# Patient Record
Sex: Female | Born: 1985
Health system: Southern US, Community
[De-identification: ages and names within clinical notes are randomized; demographics above are authoritative.]

## PROBLEM LIST (undated history)

## (undated) DIAGNOSIS — T783XXA Angioneurotic edema, initial encounter: Secondary | ICD-10-CM

## (undated) DIAGNOSIS — F909 Attention-deficit hyperactivity disorder, unspecified type: Secondary | ICD-10-CM

## (undated) DIAGNOSIS — I493 Ventricular premature depolarization: Secondary | ICD-10-CM

## (undated) DIAGNOSIS — N390 Urinary tract infection, site not specified: Secondary | ICD-10-CM

## (undated) DIAGNOSIS — J45909 Unspecified asthma, uncomplicated: Secondary | ICD-10-CM

## (undated) HISTORY — DX: Attention-deficit hyperactivity disorder, unspecified type: F90.9

## (undated) HISTORY — DX: Angioneurotic edema, initial encounter: T78.3XXA

## (undated) HISTORY — DX: Urinary tract infection, site not specified: N39.0

## (undated) HISTORY — DX: Unspecified asthma, uncomplicated: J45.909

## (undated) HISTORY — DX: Ventricular premature depolarization: I49.3

## (undated) HISTORY — PX: ESOPHAGOGASTRODUODENOSCOPY: SHX1529

---

## 2006-12-10 HISTORY — PX: WISDOM TOOTH EXTRACTION: SHX21

## 2008-12-10 HISTORY — PX: LEEP: SHX91

## 2017-04-15 ENCOUNTER — Emergency Department (HOSPITAL_COMMUNITY)
Admission: EM | Admit: 2017-04-15 | Discharge: 2017-04-15 | Disposition: A | Discharge status: Home / Self Care | Payer: Self-pay | Attending: Dermatology | Admitting: Dermatology

## 2017-04-15 DIAGNOSIS — R070 Pain in throat: Secondary | ICD-10-CM | POA: Insufficient documentation

## 2017-04-15 DIAGNOSIS — Z5321 Procedure and treatment not carried out due to patient leaving prior to being seen by health care provider: Secondary | ICD-10-CM | POA: Insufficient documentation

## 2017-04-15 DIAGNOSIS — T781XXA Other adverse food reactions, not elsewhere classified, initial encounter: Secondary | ICD-10-CM | POA: Insufficient documentation

## 2017-04-15 NOTE — ED Notes (Signed)
PT CALLED X1 WITH NO ANSWER. 

## 2017-04-15 NOTE — ED Triage Notes (Signed)
Pt called from triage with no answer 

## 2017-04-15 NOTE — ED Triage Notes (Signed)
Pt states she is allergic to walnuts and drank a drink with almond in it, she said it felt like her throat was getting thick, no rash noted Pt took liquid benedryl and feels a little better

## 2017-07-02 ENCOUNTER — Ambulatory Visit: Payer: Self-pay | Admitting: Allergy and Immunology

## 2017-07-04 ENCOUNTER — Ambulatory Visit (INDEPENDENT_AMBULATORY_CARE_PROVIDER_SITE_OTHER): Payer: 59 | Admitting: Allergy and Immunology

## 2017-07-04 ENCOUNTER — Encounter: Payer: Self-pay | Admitting: Allergy and Immunology

## 2017-07-04 ENCOUNTER — Ambulatory Visit: Payer: Self-pay | Admitting: Allergy and Immunology

## 2017-07-04 VITALS — BP 116/78 | HR 80 | Temp 98.6°F | Resp 16 | Ht 62.68 in | Wt 144.8 lb

## 2017-07-04 DIAGNOSIS — J4520 Mild intermittent asthma, uncomplicated: Secondary | ICD-10-CM

## 2017-07-04 DIAGNOSIS — T781XXA Other adverse food reactions, not elsewhere classified, initial encounter: Secondary | ICD-10-CM | POA: Diagnosis not present

## 2017-07-04 DIAGNOSIS — J3089 Other allergic rhinitis: Secondary | ICD-10-CM | POA: Diagnosis not present

## 2017-07-04 DIAGNOSIS — Z91018 Allergy to other foods: Secondary | ICD-10-CM | POA: Diagnosis not present

## 2017-07-04 MED ORDER — FAMOTIDINE 20 MG PO TABS: 20.0000 mg | ORAL_TABLET | Freq: Every day | ORAL | 5 refills | Status: DC

## 2017-07-04 MED ORDER — LORATADINE 10 MG PO TABS
10.0000 mg | ORAL_TABLET | Freq: Every day | ORAL | 5 refills | Status: DC
Start: 2017-07-04 — End: 2019-01-14

## 2017-07-04 MED ORDER — AUVI-Q 0.3 MG/0.3ML IJ SOAJ: INTRAMUSCULAR | 3 refills | Status: DC

## 2017-07-04 NOTE — Progress Notes (Signed)
Dear Dr. Susa SimmondsVia,  Thank you for referring [name] [name] to the [facility] [place] on 07/04/2017.   Below is a summation of this patient's evaluation and recommendations.  Thank you for your referral. I will keep you informed about this patient's response to treatment.   If you have any questions please do not hesitate to contact me.   Sincerely,  Jessica PriestEric J. Kozlow, MD Allergy / Immunology [facility]   ______________________________________________________________________    NEW PATIENT NOTE  Referring Provider: Iva BoopVia, Kevin, MD Primary Provider: Iva BoopVia, Kevin, MD Date of office visit: 07/04/2017    Subjective:   Chief Complaint:  [name] [name] (DOB: August 23, 1986) is a 31 y.o. female who [name] to the clinic on 07/04/2017 with a chief complaint of Allergic Reaction .     HPI: [name] [name] to this clinic in evaluation of possible allergic reaction. In March 2017 after eating banana bread with walnut she developed an issue with throat closing and throat itching. New years of 2018 she had soy-based eggnog and within 1 minute she felt her throat closing up and she could not swallow and she took Benadryl. Subsequently she has had coffee creamer with almond and has developed a similar type presentation with an itchy tongue and itchy throat and finding it hard to swallow. This has also occurred when eating various other items like Snickers and Teddy grams and eating french fries. She has also had a problem with eating pears and other fruits regarding mouth and throat itching. She has never had any associated systemic or constitutional symptoms with these events.  She also has a history of exercise-induced asthma that has presented itself over the course of the past year or so. Apparently she was given Advair at one point that she only used intermittently. She has also been given a short acting bronchodilator that  she uses prior to exercise with good results. She has not required a systemic steroid to treat an exacerbation of asthma. She does have a history of allergic rhinitis for which she will occasionally use antihistamines or nasal steroids.  Apparently whenever she develops one of her allergic reactions in addition to using a antihistamine she will take a prednisone dose.  She does not have any reflux symptoms to suggest that she has significant LPR. She has no difficulty with swallowing or eating and does not develop any choking or gagging or coughing when either drinking or eating.  Past Medical History:  Diagnosis Date  . Asthma     Past Surgical History:  Procedure Laterality Date  . WISDOM TOOTH EXTRACTION  2008    Allergies as of 07/04/2017      Reactions   Ketoconazole Rash      Medication List      BENADRYL ALLERGY PO Take by mouth as needed.   cetirizine 10 MG tablet Commonly known as:  ZYRTEC Take 10 mg by mouth as needed for allergies.   EPIPEN 2-PAK 0.3 mg/0.3 mL Soaj injection Generic drug:  EPINEPHrine   FLONASE 50 MCG/ACT nasal spray Generic drug:  fluticasone Place 1 spray into both nostrils daily as needed for allergies or rhinitis.   predniSONE 10 MG tablet Commonly known as:  DELTASONE Take 10 mg by mouth.   PROAIR HFA 108 (90 Base) MCG/ACT inhaler Generic drug:  albuterol Inhale 2 puffs into the lungs every 6 (six) hours as needed for wheezing or shortness of breath.  Review of systems negative except as noted in HPI / PMHx or noted below:  Review of Systems  Constitutional: Negative.   HENT: Negative.   Eyes: Negative.   Respiratory: Negative.   Cardiovascular: Negative.   Gastrointestinal: Negative.   Genitourinary: Negative.   Musculoskeletal: Negative.   Skin: Negative.   Neurological: Negative.   Endo/Heme/Allergies: Negative.   Psychiatric/Behavioral: Negative.     Family History  Problem Relation Age of Onset  . Diabetes  Mother   . Diabetes Father   . Heart disease Father   . Eczema Sister     Social History   Social History  . Marital status: Married    Spouse name: N/A  . Number of children: N/A  . Years of education: N/A   Occupational History  . Not on file.   Social History Main Topics  . Smoking status: Never Smoker  . Smokeless tobacco: Never Used  . Alcohol use Yes     Comment: Very rare  . Drug use: No  . Sexual activity: Not on file   Other Topics Concern  . Not on file   Social History Narrative  . No narrative on file    Environmental and Social history  Lives in a house with a dry environment, no animals located inside the household, plastic on the bed, plastic on the pillow, no smokers located inside the household and employment as a Publishing rights manager.  Objective:   Vitals:   07/04/17 0924  BP: 116/78  Pulse: 80  Resp: 16  Temp: 98.6 F (37 C)   Height: 5' 2.68" (159.2 cm) Weight: 144 lb 12.8 oz (65.7 kg)  Physical Exam  Constitutional: She is well-developed, well-nourished, and in no distress.  HENT:  Head: Normocephalic. Head is without right periorbital erythema and without left periorbital erythema.  Right Ear: Tympanic membrane, external ear and ear canal normal.  Left Ear: Tympanic membrane, external ear and ear canal normal.  Nose: Nose normal. No mucosal edema or rhinorrhea.  Mouth/Throat: Oropharynx is clear and moist and mucous membranes are normal. No oropharyngeal exudate.  Eyes: Pupils are equal, round, and reactive to light. Conjunctivae and lids are normal.  Neck: Trachea normal. No tracheal deviation present. No thyromegaly present.  Cardiovascular: Normal rate, regular rhythm, S1 normal, S2 normal and normal heart sounds.   No murmur heard. Pulmonary/Chest: Effort normal. No stridor. No tachypnea. No respiratory distress. She has no wheezes. She has no rales. She exhibits no tenderness.  Abdominal: Soft. She exhibits no distension and no  mass. There is no hepatosplenomegaly. There is no tenderness. There is no rebound and no guarding.  Musculoskeletal: She exhibits no edema or tenderness.  Lymphadenopathy:       Head (right side): No tonsillar adenopathy present.       Head (left side): No tonsillar adenopathy present.    She has no cervical adenopathy.    She has no axillary adenopathy.  Neurological: She is alert. Gait normal.  Skin: No rash noted. She is not diaphoretic. No erythema. No pallor. Nails show no clubbing.  Psychiatric: Mood and affect normal.    Diagnostics: Allergy skin tests were performed. He demonstrated hypersensitivity to grasses, weeds, trees,molds, and cat. She also demonstrated hypersensitivity to peanut, soybean, corn,green peas, cottonseed, hazelnut, [place] nut  Spirometry was performed and demonstrated an FEV1 of 2.57 @ 98 % of predicted.  Assessment and Plan:    1. Food allergy   2. Pollen-food allergy, initial encounter   3. Asthma,  mild intermittent, well-controlled   4. Other allergic rhinitis     1. Allergen avoidance measures  2. Auvi-Q 0.3, Benadryl, M.D./ER evaluation for allergic reaction  3. Can utilize a preventative plan including loratadine 10 mg and famotidine 20 mg daily  4. Can continue to use albuterol MDI 2 inhalations before exercise  5. Blood tests - nut panel, peanut components  6. Challenge?  7. Other sources of throat problem? Yes, if continued problems without obvious precipitants  Megen appears to have immunological hyperreactivity directed against both pollens and food products. I suspect that she has oral allergy syndrome for the most part and may have some systemic symptoms. She will need to be very careful about eating food products that cross react with pollens as there is a chance of her developing systemic symptoms with exposure. I have given her an injectable epinephrine device to have on hand if she ever develops a significant reaction. She can try a  combination of an H1 and H2 receptor blocker to see if this increases her threshold for exposure. I did have a talk with her today about the role of immunotherapy directed against aeroallergens that may modify her oral allergy syndrome. I'm going to check a nut panel and peanut components to see if she would qualify for an in clinic food challenge and also to get a better idea about whether or not her peanut sensitivity is a result of pollen cross-reactivity or directed against non-labile proteins. I will contact her with the results of her blood tests once they are available for review and we can make a plan on how to proceed pending her response to the approach noted above.  Jessica PriestEric J. Kozlow, MD Allergy / Immunology [facility]

## 2017-07-04 NOTE — Patient Instructions (Addendum)
  1. Allergen avoidance measures  2. Auvi-Q 0.3, Benadryl, M.D./ER evaluation for allergic reaction  3. Can utilize a preventative plan including loratadine 10 mg and famotidine 20 mg daily  4. Can continue to use albuterol MDI 2 inhalations before exercise  5. Blood tests - nut panel, peanut components  6. Challenge?  7. Other sources of throat problem? Yes, if continued problems without obvious precipitants

## 2017-07-04 NOTE — Progress Notes (Deleted)
Follow-up Note  Referring Provider: Iva Boop, MD Primary Provider: Iva Boop, MD Date of Office Visit: 07/04/2017   Subjective:   [name] [name] (DOB: Dec 12, 1985) is a 31 y.o. female who [facility]2017 in re-evaluation of the following:  HPI:   Allergies as of 07/04/2017      Reactions   Ketoconazole Rash      Medication List       Accurate as of 07/04/17  9:55 AM. Always use your most recent med list.          BENADRYL ALLERGY PO Take by mouth as needed.   cetirizine 10 MG tablet Commonly known as:  ZYRTEC Take 10 mg by mouth as needed for allergies.   EPIPEN 2-PAK 0.3 mg/0.3 mL Soaj injection Generic drug:  EPINEPHrine   FLONASE 50 MCG/ACT nasal spray Generic drug:  fluticasone Place 1 spray into both nostrils daily as needed for allergies or rhinitis.   predniSONE 10 MG tablet Commonly known as:  DELTASONE Take 10 mg by mouth.   PROAIR HFA 108 (90 Base) MCG/ACT inhaler Generic drug:  albuterol Inhale 2 puffs into the lungs every 6 (six) hours as needed for wheezing or shortness of breath.   ranitidine 150 MG tablet Commonly known as:  ZANTAC Take 150 mg by mouth as needed.       Past Medical History:  Diagnosis Date  . Asthma     Past Surgical History:  Procedure Laterality Date  . WISDOM TOOTH EXTRACTION  2008    Review of systems negative except as noted in HPI / PMHx or noted below:  ROS   Objective:   Vitals:   07/04/17 0924  BP: 116/78  Pulse: 80  Resp: 16  Temp: 98.6 F (37 C)   Height: 5' 2.68" (159.2 cm)  Weight: 144 lb 12.8 oz (65.7 kg)   Physical Exam  Diagnostics:    Spirometry was performed and demonstrated an FEV1 of *** at *** % of predicted.  The patient had an Asthma Control Test with the following results:  .    Assessment and Plan:   1. Food allergy   2. Asthma, mild intermittent, well-controlled     Patient Instructions   1. Allergen avoidance  measures  2. Auvi-Q 0.3, Benadryl, M.D./ER evaluation for allergic reaction  3. Can utilize a preventative plan including loratadine 10 mg and famotidine 20 mg daily  4. Can continue to use albuterol MDI 2 inhalations before exercise  5. Blood tests - nut panel, peanut components  6. Challenge?  7. Other sources of throat problem?   Laurette Schimke, MD Allergy / Immunology [facility]

## 2017-12-05 ENCOUNTER — Encounter: Payer: Self-pay | Admitting: Allergy

## 2017-12-05 ENCOUNTER — Ambulatory Visit (INDEPENDENT_AMBULATORY_CARE_PROVIDER_SITE_OTHER): Admitting: Allergy

## 2017-12-05 ENCOUNTER — Telehealth: Payer: Self-pay

## 2017-12-05 VITALS — BP 110/68 | HR 87 | Resp 17

## 2017-12-05 DIAGNOSIS — Z91018 Allergy to other foods: Secondary | ICD-10-CM | POA: Diagnosis not present

## 2017-12-05 DIAGNOSIS — J3089 Other allergic rhinitis: Secondary | ICD-10-CM

## 2017-12-05 DIAGNOSIS — T781XXD Other adverse food reactions, not elsewhere classified, subsequent encounter: Secondary | ICD-10-CM | POA: Diagnosis not present

## 2017-12-05 DIAGNOSIS — J4520 Mild intermittent asthma, uncomplicated: Secondary | ICD-10-CM | POA: Diagnosis not present

## 2017-12-05 MED ORDER — LEVOCETIRIZINE DIHYDROCHLORIDE 5 MG PO TABS: 5.0000 mg | ORAL_TABLET | Freq: Every evening | ORAL | 1 refills | Status: DC

## 2017-12-05 MED ORDER — FAMOTIDINE 20 MG PO TABS: 20.0000 mg | ORAL_TABLET | Freq: Every day | ORAL | 1 refills | Status: DC

## 2017-12-05 MED ORDER — MONTELUKAST SODIUM 10 MG PO TABS: 10.0000 mg | ORAL_TABLET | Freq: Every day | ORAL | 1 refills | Status: DC

## 2017-12-05 MED ORDER — EPINEPHRINE 0.3 MG/0.3ML IJ SOAJ: 0.3000 mg | Freq: Once | INTRAMUSCULAR | 1 refills | Status: AC

## 2017-12-05 NOTE — Patient Instructions (Addendum)
  1. Environmental Allergen avoidance measures for grasses, weeds, trees,molds, and cat.  Food allergy avoidance measures for peanut, soybean, corn, green peas, cottonseed, hazelnut, [place] nut.   Will obtain serum IgE levels to these foods above as well as for wheat.   Will also obtain tryptase level.    2. Epipen 0.3, Benadryl, M.D./ER evaluation for allergic reaction.  Follow emergency action plan in case of allergic reaction  3. Can utilize a preventative plan including Xyzal 5 mg +/- famotidine 20 mg daily  4. Start Singulair 10mg  daily which helps with exercise induced asthma,  environmental allergy as well as helps decrease risk of allergic reaction.  5. Can continue to use albuterol MDI 2 inhalations before exercise and as needed every 4-6 hours for cough/wheeze/shortness of breath/chest tightness symptoms  6. In-office Challenge may be options pending lab results  Follow-up 4 months or sooner if needed

## 2017-12-05 NOTE — Telephone Encounter (Signed)
Referral placed in [facility] for [facility]. Will follow up next week.

## 2017-12-05 NOTE — Progress Notes (Signed)
Follow-up Note  RE: [name] [name] MRN: [id number] DOB: 03-Jun-1986 Date of Office Visit: 12/05/2017   History of present illness: [name] [name] is a 31 y.o. female presenting today for follow-up of food allergy, asthma, pollen food allergy syndrome and allergic rhinitis.  She was last seen in the office on 07/04/17 by Dr. Lucie LeatherKozlow.  Since this visit she has not had any surgeries or hospitalizations.  She has had issues with reactions surrounding food ingestion since last visit.  She states she has now noticed reactions to foods she thought were previously safe.  She states over the holiday she [facility] and within minutes started to feel her throat tighten with trouble swallowing.  She started taking benadryl melt tabs which she states she starts with 12.5mg  and repeats dose if symptoms persist.  She states she has never needed to get up to 50mg .  She had another reaction after christmas dinner which she recalls having cornbread made with cream corn.  She states this reaction was milder and she didn't need any benadryl.  She had another reaction with same oral symptoms with apple slices.  Her skin testing at last visit did show hypersensitivity to peanut, soybean, corn, green peas, cottonseed, hazelnut, [place] nut.  She has been avoiding nuts and soy.  She does not have access to her own epinephrine device but states his son has an epinephrine device.   She states she is able to eat blueberries, blackberries, strawberries without issue.   She was advised after last visit to take loratadine and famotidine however she has not taken these as she was unsure as to why she needed to take these medications.   She would like to see a nutritionist as she is now scared about what she is able to eat.     She gets winded when she works out outside during physical activity.  Thus she reports she does not work out.  She states this only happens since she has been in [place] since 2011.  She  states does use albuterol beforehand which does help prevent symptoms.  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  She does feel her breathing is tight right now.  She states her mother is here visit for holidays who brought her cats with her and she has a live christmas tree which she feels is bothering her asthma symptoms.     She states she has not had any significant nasal or ocular symptoms at this time.     Review of systems: Review of Systems  Constitutional: Negative for chills, fever and malaise/fatigue.  HENT: Negative for congestion, ear discharge, ear pain, nosebleeds, sinus pain and sore throat.   Eyes: Negative for pain, discharge and redness.  Respiratory: Negative for cough, sputum production, shortness of breath and wheezing.   Cardiovascular: Negative for chest pain.  Gastrointestinal: Negative for abdominal pain, constipation, diarrhea, heartburn, nausea and vomiting.  Musculoskeletal: Negative for joint pain.  Skin: Negative for itching and rash.  Neurological: Negative for headaches.    All other systems negative unless noted above in HPI  Past medical/social/surgical/family history have been reviewed and are unchanged unless specifically indicated below.  No changes  Medication List: Allergies as of 12/05/2017      Reactions   Ketoconazole Rash      Medication List        Accurate as of 12/05/17 12:26 PM. Always use your most recent med  list.          BENADRYL ALLERGY PO Take by mouth as needed.   cetirizine 10 MG tablet Commonly known as:  ZYRTEC Take 10 mg by mouth as needed for allergies.   EPIPEN 2-PAK 0.3 mg/0.3 mL Soaj injection Generic drug:  EPINEPHrine   AUVI-Q 0.3 mg/0.3 mL Soaj injection Generic drug:  EPINEPHrine Use as directed for life-threatening allergic reaction.   famotidine 20 MG tablet Commonly known as:  PEPCID Take 1 tablet (20 mg total) by mouth daily.   FLONASE 50 MCG/ACT nasal spray Generic drug:   fluticasone Place 1 spray into both nostrils daily as needed for allergies or rhinitis.   loratadine 10 MG tablet Commonly known as:  CLARITIN Take 1 tablet (10 mg total) by mouth daily.   PROAIR HFA 108 (90 Base) MCG/ACT inhaler Generic drug:  albuterol Inhale 2 puffs into the lungs every 6 (six) hours as needed for wheezing or shortness of breath.       Known medication allergies: Allergies  Allergen Reactions  . Ketoconazole Rash     Physical examination: Blood pressure 110/68, pulse 87, resp. rate 17, SpO2 98 %.  General: Alert, interactive, in no acute distress. HEENT: PERRLA, TMs pearly gray, turbinates minimally edematous without discharge, post-pharynx non erythematous. Neck: Supple without lymphadenopathy. Lungs: Clear to auscultation without wheezing, rhonchi or rales. {no increased work of breathing.  Improved aeration following albuterol neb CV: Normal S1, S2 without murmurs. Abdomen: Nondistended, nontender. Skin: Warm and dry, without lesions or rashes. Extremities:  No clubbing, cyanosis or edema. Neuro:   Grossly intact.  Diagnositics/Labs:  Spirometry: FEV1: 1.09L  31%, FVC: 1.15L  29% post albuterol nebulization she had improvement in FEV1 to 1.43L which is a 31% improvement.   Assessment and plan:   Food allergy  -  She has had several reactions involving oral symptoms only after eating cornbread, animal crackers and apples since last visit.  The cornbread was made with cream corn which she has shown sensitivity too.  Animal crackers contain per ingredient list yellow corn flour, soybean oil, cottonseed oil of which the corn flour is most likely culprit.  Pt provided with list of foods she showed sensitivity too and will avoid these products for now.   Mild intermittent asthma - mostly related to activity and will start singulair Allergic rhinitis Pollen food allergy syndrome  1. Environmental Allergen avoidance measures for grasses, weeds, trees,molds,  and cat.  Food allergy avoidance measures for peanut, soybean, corn, green peas, cottonseed, hazelnut, [place] nut.   Will obtain serum IgE levels to these foods above as well as for wheat.   Will also obtain tryptase level.    2. Epipen 0.3, Benadryl, M.D./ER evaluation for allergic reaction.  Follow emergency action plan in case of allergic reaction  3. Can utilize a preventative plan including Xyzal 5 mg +/- famotidine 20 mg daily  4. Start Singulair 10mg  daily which helps with asthma,  environmental allergy as well as helps decrease risk of allergic reaction.  5. Can continue to use albuterol MDI 2 inhalations before exercise and as needed every 4-6 hours for cough/wheeze/shortness of breath/chest tightness symptoms  6. In-office Challenge may be options pending lab results  7.  Will also refer to nutritionist  Follow-up 4 months or sooner if needed  I appreciate the opportunity to take part in Sherilynn's care. Please do not hesitate to contact me with questions.  Sincerely,   Margo AyeShaylar Padgett, MD Allergy/Immunology [facility]

## 2017-12-05 NOTE — Telephone Encounter (Signed)
-----   Message from [facility] Larose HiresPatricia Padgett, MD sent at 12/05/2017  1:11 PM EST ----- Regarding: nutrition referral Please send referral for nutritionist with Dr. Estanislado PandyQuan's office.  Pt has mutltiple food allergy and is anxious about what she is able to eat

## 2017-12-08 LAB — ALLERGENS(7)
F020-IGE ALMOND: 0.8 kU/L — AB
F202-IgE Cashew Nut: <0.1 kU/L
Hazelnut (Filbert) IgE: 10.2 kU/L — AB
PEANUT IGE: 0.29 kU/L — AB
Pecan Nut IgE: <0.1 kU/L
Walnut IgE: <0.1 kU/L

## 2017-12-08 LAB — K083-IGE COTTONSEED

## 2017-12-08 LAB — ALLERGEN, CORN F8: Allergen Corn, IgE: 0.11 kU/L — AB

## 2017-12-08 LAB — TRYPTASE: Tryptase: 2.1 ug/L — ABNORMAL LOW (ref 2.2–13.2)

## 2017-12-08 LAB — ALLERGEN, WHEAT, F4

## 2017-12-08 LAB — ALLERGEN SOYBEAN: SOYBEAN IGE: 0.25 kU/L — AB

## 2017-12-08 LAB — ALLERGEN PEA F12: Allergen Green Pea IgE: <0.1 kU/L

## 2017-12-12 ENCOUNTER — Telehealth: Payer: Self-pay

## 2017-12-12 NOTE — Telephone Encounter (Signed)
Unfortunately there is not another replacement for singulair for exercise induced asthma symptoms.   Thus would just recommend she use her albuterol 2 puffs 15-20 minutes prior to activity.

## 2017-12-12 NOTE — Telephone Encounter (Signed)
Patient called to get her labs due to her going to see the nutritionist on tomorrow.   Please Advise

## 2017-12-12 NOTE — Telephone Encounter (Signed)
I called patient and gave her lab results. She wanted me to inform you that the montelukast did help her breathe but causes her dizziness and wanted to know if there is there another medication that she could try. Patient stated that she wanted to continue to exercise and breathe during it. Please advise.

## 2017-12-12 NOTE — Telephone Encounter (Signed)
Called and informed patient of Dr. Randell PatientPadgett's recommendation and she said she will do the albuterol before to see if it helps.

## 2017-12-12 NOTE — Telephone Encounter (Signed)
Result labs out for pt and is in the [place] pool.   The nutritionist being apart of [facility] should be able to see them thru Epic.

## 2017-12-13 ENCOUNTER — Encounter: Payer: Self-pay | Admitting: Registered"

## 2017-12-13 ENCOUNTER — Encounter: Attending: Allergy | Admitting: Registered"

## 2017-12-13 DIAGNOSIS — Z91018 Allergy to other foods: Secondary | ICD-10-CM | POA: Insufficient documentation

## 2017-12-13 DIAGNOSIS — Z713 Dietary counseling and surveillance: Secondary | ICD-10-CM | POA: Insufficient documentation

## 2017-12-13 NOTE — Patient Instructions (Addendum)
Make sure to get in three meals per day. Try to have balanced meals like the My Plate example (see handout). Try to include more vegetables, fruits, and whole grains at meals.    For help with meal planning:   https://www.kidswithfoodallergies.org/page/welcome.aspx  Https://whatscooking.AdDates.czfns.[place]da.gov/  Also recommend the foodallergy.org website for more information.

## 2017-12-13 NOTE — Progress Notes (Signed)
Medical Nutrition Therapy:  Appt start time: 1025 end time:  1150.   Assessment:  Primary concerns today: Pt referred due to multiple food allergies. Pt reports in 2017 she discovered she was lactose intolerant after having GI problems following eating pizza with cheese. Around July 2017 pt reports she made banana bread with walnuts and her throat started to close and she took benadryl. Pt reports she has never went into anaphalactic shock, but always takes benadryl when she feels any reaction coming on. Reactions usually include throat and/or mouth swelling. Pt also has environmental allergies. Pt has an Epi Pen but has not had to use it. Pt underwent allergy testing last year which showed positive results for peanuts, soybeans, corn, green peas, cottonseed, hazelnut, and [place] nuts. Pt reports that further blood tests did not show a positive result for green peas and cottonseed but all other foods remained positive. Pt reports she started having problems with taking a Women's Alive vitamin which contains fruits. She said the reaction did not require she take Benadryl but made her mouth feel like she was experiencing somewhat of an allergic reaction. Pt reports she has had mild oral reactions to some fruits (pears, mango, kiwi, cherry, and some types of apples. Reactions did not require Benadryl, pt reports she drinks water and that helps. Pt reports she had an allergic reaction with oral symptoms after eating [employer] animal crackers and cornbread which both contain corn as the crackers contain corn flour. Pt reports the most severe reaction she has experienced was after drinking Silk soy milk one time. She is unsure if it was due to the soy or cross contamination with nuts. Before having the reaction with the crackers and cornbread last month, pt reports she would regularly eat corn products, including corn chips and corn on the cob. Pt is a Publishing rights manager. Pt reports that she plans to ask her allergist  about doing allergy shots and possibly an in office food challenge for corn as pt reports she does not want to have to avoid corn long-term if she can help it as there are many foods that contain corn derived ingredients.   Food allergies/intolerances: Lactose intolerance -March 2017. Pt reports that she cannot tolerate cheese, milk, or yogurt. Drinks Fairlife milk. Pt tested positive via skin prick and blood test per pt for peanut, soy, corn, hazelnut, and [place] nut allergies. Skin prick tested positive for green pea and cottenseed allergy but negative blood test.   Pt currently tries to avoid lactose, nuts, corn, and soy.   Preferred Learning Style:   No preference indicated   Learning Readiness:   Ready   MEDICATIONS: See list.    DIETARY INTAKE:  Usual eating pattern includes 3 meals per day.  Everyday foods include toast, eggs, mini bagel, Fairlife milk .  Avoided foods include kiwi, cherry, some apples, pears, mango-pt reports having mild reaction to these fruits. She reports the reaction does not require Benadryl but makes her mouth feel like she has a mild reaction. Does eat blueberries, oranges, blackberries, apple sauce, pomegranate, most vegetables except raw celery. Pt reports she tries to have at least one vegetable at each meal.   24-hr recall:  B ( AM): 2 boiled egg, 1 orange, earl grey tea with sugar, 1/2 cup carrot juice OR may do mini bagel with a meat, tea.  Snk ( AM): None reported.  L ( PM): 3/4 cup white rice, black eyed peas, 1/2 fillet cod fish  Snk ( PM):  None reported.  D ( PM): 1/2 piece salmon, salad with [place] dressing  Snk ( PM): None reported.  Beverages: water, 1 cup of milk, 1/2 cup carrot juice  Usual physical activity: Pt reports that she has not had very much energy since she has been having more trouble with allergies.   Progress Towards Goal(s):  In progress.   Nutritional Diagnosis:  NB-1.1 Food and nutrition-related knowledge deficit As  related to avoiding food allergens: soy, nuts, and corn .  As evidenced by pt has questions regarding whether she can consume several foods that contain one or more of her food allergens .    Intervention:  Nutrition counseling provided. Dietitian provided education on reading food labels to avoid pt's food allergens as well as alternate food names for some of pt's food allergens. Recommended that when pt reads a label she starts with ensuring it does not contain soy or nuts and then assesses the label for corn/corn derived ingredients. Provided education on balanced nutrition and how to ensure pt is receiving all necessary nutrients while avoiding her food allergens. Discussed a possible lactose free yogurt as well as trying SunButter as pt reports she used to really enjoy eating peanut butter before food allergy dx. Discussed the importance of always checking food labels even if product has been found to be free of food allergens in the past as ingredients can change.  Teaching Method Utilized:  Visual Auditory  Handouts given during visit include:  Balanced plate with food list.   FARE packet   Corn Allergy Tips   Corn Allergy Nutrition Therapy   ACAAI: Corn Allergy   Barriers to learning/adherence to lifestyle change: None indicated.   Demonstrated degree of understanding via:  Teach Back   Monitoring/Evaluation:  Dietary intake, exercise, and body weight prn. Pt plans to call back to schedule an appointment in the future.

## 2017-12-16 ENCOUNTER — Other Ambulatory Visit: Payer: Self-pay

## 2017-12-16 ENCOUNTER — Encounter (HOSPITAL_COMMUNITY): Payer: Self-pay

## 2017-12-16 DIAGNOSIS — W1830XA Fall on same level, unspecified, initial encounter: Secondary | ICD-10-CM | POA: Insufficient documentation

## 2017-12-16 DIAGNOSIS — R51 Headache: Secondary | ICD-10-CM | POA: Insufficient documentation

## 2017-12-16 DIAGNOSIS — Y999 Unspecified external cause status: Secondary | ICD-10-CM | POA: Diagnosis not present

## 2017-12-16 DIAGNOSIS — R42 Dizziness and giddiness: Secondary | ICD-10-CM | POA: Diagnosis present

## 2017-12-16 DIAGNOSIS — Y939 Activity, unspecified: Secondary | ICD-10-CM | POA: Diagnosis not present

## 2017-12-16 DIAGNOSIS — Y929 Unspecified place or not applicable: Secondary | ICD-10-CM | POA: Insufficient documentation

## 2017-12-16 NOTE — ED Triage Notes (Signed)
[facility] with c/o dizziness, tinnitis, and a fall 2 days after taking Tobramycin eye gtts for the pink eye. Per physician notes, patient c/o head fullness, but does not have congestion.

## 2017-12-17 ENCOUNTER — Emergency Department (HOSPITAL_COMMUNITY)
Admission: EM | Admit: 2017-12-17 | Discharge: 2017-12-17 | Disposition: A | Discharge status: Home / Self Care | Attending: Emergency Medicine | Admitting: Emergency Medicine

## 2017-12-17 ENCOUNTER — Emergency Department (HOSPITAL_COMMUNITY)

## 2017-12-17 DIAGNOSIS — R42 Dizziness and giddiness: Secondary | ICD-10-CM

## 2017-12-17 LAB — I-STAT CHEM 8, ED
BUN: 9 mg/dL (ref 6–20)
CREATININE: 0.6 mg/dL (ref 0.44–1.00)
Calcium, Ion: 1.13 mmol/L — ABNORMAL LOW (ref 1.15–1.40)
Chloride: 102 mmol/L (ref 101–111)
Glucose, Bld: 90 mg/dL (ref 65–99)
HEMATOCRIT: 45 % (ref 36.0–46.0)
Hemoglobin: 15.3 g/dL — ABNORMAL HIGH (ref 12.0–15.0)
POTASSIUM: 3.8 mmol/L (ref 3.5–5.1)
Sodium: 139 mmol/L (ref 135–145)
TCO2: 25 mmol/L (ref 22–32)

## 2017-12-17 LAB — TSH: TSH: 3.229 u[IU]/mL (ref 0.350–4.500)

## 2017-12-17 LAB — I-STAT BETA HCG BLOOD, ED (MC, WL, AP ONLY)

## 2017-12-17 MED ORDER — MECLIZINE HCL 25 MG PO TABS
25.0000 mg | ORAL_TABLET | Freq: Once | ORAL | Status: AC
  Administered 2017-12-17: 25 mg via ORAL
  Filled 2017-12-17: qty 1

## 2017-12-17 MED ORDER — MECLIZINE HCL 25 MG PO TABS: 25.0000 mg | ORAL_TABLET | Freq: Three times a day (TID) | ORAL | 0 refills | Status: DC | PRN

## 2017-12-17 NOTE — ED Provider Notes (Signed)
[facility]-EMERGENCY DEPT Provider Note   CSN: [id number] Arrival date & time: 12/16/17  1725    History   Chief Complaint Chief Complaint  Patient presents with  . Dizziness  . Fall  . Headache    HPI [name] [name] is a 32 y.o. female.  32 year old female with no significant past medical history presents to the emergency department at the advice of her doctor for evaluation of dizziness. Patient states that symptoms began on 11/14/2017 after she was using tobramycin eyedrops for conjunctivitis. She noticed onset of dizziness care tries as "being on a merry-go-round" 2 days after starting this topical antibiotic. Symptoms were aggravated when patient tilted her head to the right and was associated with phonophobia and photophobia. She did notice gradual improvement in her symptoms over the next 5 days when taking Benadryl.. She recently developed worsening of her symptoms with increased bilateral eye pressure. This is associated with intermittent blurry vision as well as intermittent, bilateral tinnitus. She was started on Xyzal and Singulair by her allergist, but this has provided little relief. No head injury, trauma, or fevers. No complete vision loss, extremity numbness/paresthesias, extremity weakness. No recent steroid use. The patient was seen at [facility] today after she fell due to severity of her symptoms. Patient notes the physician expressed concern about ICH and advised that she "come to the ED for imaging". No hx of recent pregnancy. No PHx of FHx of blood clots.      Past Medical History:  Diagnosis Date  . Asthma     There are no active problems to display for this patient.   Past Surgical History:  Procedure Laterality Date  . WISDOM TOOTH EXTRACTION  2008    OB History    No data available       Home Medications    Prior to Admission medications   Medication Sig Start Date End Date Taking? Authorizing Provider    DiphenhydrAMINE HCl (BENADRYL ALLERGY PO) Take by mouth as needed.   Yes [provider]  loratadine (CLARITIN) 10 MG tablet Take 1 tablet (10 mg total) by mouth daily. Patient taking differently: Take 10 mg by mouth daily as needed for allergies.  07/04/17  Yes Kozlow, Alvira Philips, MD  albuterol (PROAIR HFA) 108 (90 Base) MCG/ACT inhaler Inhale 2 puffs into the lungs every 6 (six) hours as needed for wheezing or shortness of breath.    [provider]  famotidine (PEPCID) 20 MG tablet Take 1 tablet (20 mg total) by mouth daily. Patient not taking: Reported on 12/17/2017 12/05/17   Marcelyn Bruins, MD  levocetirizine (XYZAL) 5 MG tablet Take 1 tablet (5 mg total) by mouth every evening. Patient not taking: Reported on 12/17/2017 12/05/17   Marcelyn Bruins, MD  meclizine (ANTIVERT) 25 MG tablet Take 1 tablet (25 mg total) by mouth 3 (three) times daily as needed for dizziness. 12/17/17   Antony Madura, PA-C  montelukast (SINGULAIR) 10 MG tablet Take 1 tablet (10 mg total) by mouth at bedtime. Patient not taking: Reported on 12/13/2017 12/05/17   Marcelyn Bruins, MD    Family History Family History  Problem Relation Age of Onset  . Diabetes Mother   . Diabetes Father   . Heart disease Father   . Eczema Sister     Social History Social History   Tobacco Use  . Smoking status: Never Smoker  . Smokeless tobacco: Never Used  Substance Use Topics  . Alcohol use: Yes  Comment: Very rare  . Drug use: No     Allergies   Other; Peanut-containing drug products; Corn-containing products; Soy allergy; Tobramycin; and Ketoconazole   Review of Systems Review of Systems Ten systems reviewed and are negative for acute change, except as noted in the HPI.    Physical Exam Updated Vital Signs BP 112/77   Pulse 66   Temp 98.6 F (37 C) (Oral)   Resp 16   Ht 5\' 2"  (1.575 m)   Wt 65.8 kg (145 lb)   LMP 11/20/2017   SpO2 100%   BMI 26.52 kg/m    Physical Exam  Constitutional: She is oriented to person, place, and time. She appears well-developed and well-nourished. No distress.  Nontoxic appearing and in NAD  HENT:  Head: Normocephalic and atraumatic.  Symmetric rise of the uvula with phonation  Eyes: Conjunctivae and EOM are normal. Pupils are equal, round, and reactive to light. No scleral icterus.  EOMs normal. No nystagmus.  Neck: Normal range of motion.  No meningismus  Cardiovascular: Normal rate, regular rhythm and intact distal pulses.  Pulmonary/Chest: Effort normal. No stridor. No respiratory distress.  Musculoskeletal: Normal range of motion.  Neurological: She is alert and oriented to person, place, and time. No cranial nerve deficit. She exhibits normal muscle tone. Coordination normal.  GCS 15. Speech is goal oriented. No cranial nerve deficits appreciated; symmetric eyebrow raise, no facial drooping, tongue midline. Patient has equal grip strength bilaterally with 5/5 strength against resistance in all major muscle groups bilaterally. Sensation to light touch intact. Patient moves extremities without ataxia. Normal finger-nose-finger. Patient ambulatory with steady gait.  Skin: Skin is warm and dry. No rash noted. She is not diaphoretic. No erythema. No pallor.  Psychiatric: She has a normal mood and affect. Her behavior is normal.  Nursing note and vitals reviewed.    ED Treatments / Results  Labs (all labs ordered are listed, but only abnormal results are displayed) Labs Reviewed  TSH  I-STAT CHEM 8, ED  I-STAT BETA HCG BLOOD, ED ([facility], [facility], [facility] ONLY)    EKG  EKG Interpretation None       Radiology Ct Head Wo Contrast  Result Date: 12/17/2017 CLINICAL DATA:  Vertigo, persistent, central. EXAM: CT HEAD WITHOUT CONTRAST TECHNIQUE: Contiguous axial images were obtained from the base of the skull through the vertex without intravenous contrast. COMPARISON:  None. FINDINGS: Brain: No intracranial  hemorrhage, mass effect, or midline shift. No hydrocephalus. The basilar cisterns are patent. No evidence of territorial infarct or acute ischemia. No extra-axial or intracranial fluid collection. Vascular: No hyperdense vessel or unexpected calcification. Skull: Normal. Negative for fracture or focal lesion. Sinuses/Orbits: Paranasal sinuses and mastoid air cells are clear. The visualized orbits are unremarkable. Other: None. IMPRESSION: Normal noncontrast head CT. Electronically Signed   By: Rubye OaksMelanie  Ehinger M.D.   On: 12/17/2017 06:09    Procedures Procedures (including critical care time)  Medications Ordered in ED Medications  meclizine (ANTIVERT) tablet 25 mg (not administered)     Initial Impression / Assessment and Plan / ED Course  I have reviewed the triage vital signs and the nursing notes.  Pertinent labs & imaging results that were available during my care of the patient were reviewed by me and considered in my medical decision making (see chart for details).     32 year old female presents to the emergency department for symptoms consistent with vertigo. A CT scan was obtained to rule out central causes of vertigo. Head CT  was reassuring without mass/lesion, hemorrhage, hydrocephalus, infarct. Patient with a nonfocal exam. She is afebrile and without nuchal rigidity or meningismus.  Patient expresses concern for possible pseudotumor cerebri. However, I feel this is unlikely in the setting of waxing and waning symptoms. I do not believe emergent lumbar puncture is currently indicated. Plan for referral to outpatient neurology and ophthalmology. Anticipate discharge if laboratory work up reassuring. Patient signed out to Azalia Bilis, MD at change of shift.   Final Clinical Impressions(s) / ED Diagnoses   Final diagnoses:  Vertigo    ED Discharge Orders        Ordered    meclizine (ANTIVERT) 25 MG tablet  3 times daily PRN     12/17/17 0701       Antony Madura,  PA-C 12/17/17 1610    Azalia Bilis, MD 12/17/17 [phone]

## 2017-12-17 NOTE — Discharge Instructions (Signed)
Take Meclizine as prescribed for persistent symptoms. Follow up with Neurology as soon as you are able, especially for persistent symptoms. You may follow up with an Ophthalmologist if vision changes persist or worsen. You may return for new or concerning symptoms.

## 2018-02-04 ENCOUNTER — Ambulatory Visit: Admitting: Neurology

## 2018-02-04 ENCOUNTER — Telehealth: Payer: Self-pay | Admitting: *Deleted

## 2018-02-04 NOTE — Telephone Encounter (Signed)
No showed new patient appointment. 

## 2018-02-05 ENCOUNTER — Encounter: Payer: Self-pay | Admitting: Neurology

## 2018-03-17 ENCOUNTER — Telehealth: Payer: Self-pay | Admitting: *Deleted

## 2018-03-17 NOTE — Telephone Encounter (Signed)
Patient called office on 03/17/18 around 4:30pm to cancel her 03/18/18 10am appt.  She gave no reason and declined to rescheduled.

## 2018-03-18 ENCOUNTER — Ambulatory Visit: Admitting: Neurology

## 2018-03-20 ENCOUNTER — Encounter: Payer: Self-pay | Admitting: Diagnostic Neuroimaging

## 2018-03-20 ENCOUNTER — Telehealth: Payer: Self-pay | Admitting: Diagnostic Neuroimaging

## 2018-03-20 ENCOUNTER — Ambulatory Visit (INDEPENDENT_AMBULATORY_CARE_PROVIDER_SITE_OTHER): Admitting: Diagnostic Neuroimaging

## 2018-03-20 DIAGNOSIS — H81399 Other peripheral vertigo, unspecified ear: Secondary | ICD-10-CM

## 2018-03-20 NOTE — Telephone Encounter (Signed)
tricare order sent to [facility]. They will reach out to the pt to schedule.

## 2018-03-20 NOTE — Patient Instructions (Signed)
-   check MRI brain / IAC  - monitor symptoms  - meclizine as needed

## 2018-03-20 NOTE — Progress Notes (Signed)
[facility]  PATIENT: [name] [name] DOB: [birth year]  REFERRING CLINICIAN: ER  HISTORY FROM: patient  REASON FOR VISIT: new consult    HISTORICAL  CHIEF COMPLAINT:  Chief Complaint  Patient presents with  . Vertigo    rm 7, New Pt, ED referral, "Dec- recurrent vertigo, Jan- eye pain/headache/blurred vision of left eye; recent issue with fine motor skills of left hand"    HISTORY OF PRESENT ILLNESS:   32 year old female here for evaluation of eye pain, headache, blurred vision, vertigo.  December 2018 patient had viral conjunctivitis and was treated with eyedrops.  Then she developed 3 days of severe positional vertigo.  She tried meclizine which seemed to help.  Symptoms resolved.  Symptoms returned in January 2019, this time associate with nausea, headache, photophobia and phonophobia.  She was having throbbing headaches behind her eyes.  Symptoms gradually improved.  Symptoms returned in February and a mild way.  Loraine LericheMark seem to be okay.  April symptoms returned and are moderate.  Her left more the right eye was tearing.  Patient has history of headaches in her 8020s, with tension type features lasting for days at a time.  Patient has history of motion sickness.  No family history of migraine.  No other recent triggering or aggravating factors.  REVIEW OF SYSTEMS: Full 14 system review of systems performed and negative with exception of: Fatigue balance difficulty fine motor skill difficulty blurred vision ringing in ears been sensation feeling cold dizziness tremor headache memory loss decreased energy.   ALLERGIES: Allergies  Allergen Reactions  . Other     Tree nuts   . Peanut-Containing Drug Products Anaphylaxis  . Corn-Containing Products   . Soy Allergy   . Tobramycin Other (See Comments)    Caused dizziness  . Ketoconazole Rash    HOME MEDICATIONS: Outpatient Medications Prior to Visit  Medication Sig Dispense Refill  . albuterol (PROAIR  HFA) 108 (90 Base) MCG/ACT inhaler Inhale 2 puffs into the lungs every 6 (six) hours as needed for wheezing or shortness of breath.    . DiphenhydrAMINE HCl (BENADRYL ALLERGY PO) Take by mouth as needed.    . famotidine (PEPCID) 20 MG tablet Take 1 tablet (20 mg total) by mouth daily. 90 tablet 1  . fluticasone (FLONASE) 50 MCG/ACT nasal spray Place into both nostrils as needed for allergies or rhinitis.    Marland Kitchen. loratadine (CLARITIN) 10 MG tablet Take 1 tablet (10 mg total) by mouth daily. (Patient taking differently: Take 10 mg by mouth daily as needed for allergies. ) 30 tablet 5  . meclizine (ANTIVERT) 25 MG tablet Take 1 tablet (25 mg total) by mouth 3 (three) times daily as needed for dizziness. 30 tablet 0  . montelukast (SINGULAIR) 10 MG tablet Take 1 tablet (10 mg total) by mouth at bedtime. 90 tablet 1  . levocetirizine (XYZAL) 5 MG tablet Take 1 tablet (5 mg total) by mouth every evening. (Patient not taking: Reported on 12/17/2017) 90 tablet 1   No facility-administered medications prior to visit.     PAST MEDICAL HISTORY: Past Medical History:  Diagnosis Date  . ADHD   . Asthma   . UTI (urinary tract infection)    hx of freq in 2018    PAST SURGICAL HISTORY: Past Surgical History:  Procedure Laterality Date  . LEEP  2010  . WISDOM TOOTH EXTRACTION  2008    FAMILY HISTORY: Family History  Problem Relation Age of Onset  . Diabetes Mother   .  Hyperthyroidism Mother   . Diabetes Father        type 2  . Heart disease Father   . Cancer Father        prostate  . Eczema Sister   . Diabetes Maternal Grandmother        type 2    SOCIAL HISTORY:  Social History   Socioeconomic History  . Marital status: Married    Spouse name: Not on file  . Number of children: 2  . Years of education: doctorate DNP  . Highest education level: Not on file  Occupational History    Comment: [employer], NP  Social Needs  . Financial resource strain: Not on file  . Food  insecurity:    Worry: Not on file    Inability: Not on file  . Transportation needs:    Medical: Not on file    Non-medical: Not on file  Tobacco Use  . Smoking status: Never Smoker  . Smokeless tobacco: Never Used  Substance and Sexual Activity  . Alcohol use: Yes    Comment: 1-2 glasses wine/month  . Drug use: No  . Sexual activity: Not on file  Lifestyle  . Physical activity:    Days per week: Not on file    Minutes per session: Not on file  . Stress: Not on file  Relationships  . Social connections:    Talks on phone: Not on file    Gets together: Not on file    Attends religious service: Not on file    Active member of club or organization: Not on file    Attends meetings of clubs or organizations: Not on file    Relationship status: Not on file  . Intimate partner violence:    Fear of current or ex partner: Not on file    Emotionally abused: Not on file    Physically abused: Not on file    Forced sexual activity: Not on file  Other Topics Concern  . Not on file  Social History Narrative   Lives with husband, children   Tea/coffee one a day     PHYSICAL EXAM  GENERAL EXAM/CONSTITUTIONAL: Vitals:  Vitals:   03/20/18 1100  BP: 121/80  Pulse: 82  Weight: 146 lb 9.6 oz (66.5 kg)  Height: 5\' 2"  (1.575 m)     Body mass index is 26.81 kg/m.  Visual Acuity Screening   Right eye Left eye Both eyes  Without correction:     With correction: 20/20 20/20      Patient is in no distress; well developed, nourished and groomed; neck is supple  CARDIOVASCULAR:  Examination of carotid arteries is normal; no carotid bruits  Regular rate and rhythm, no murmurs  Examination of peripheral vascular system by observation and palpation is normal  EYES:  Ophthalmoscopic exam of optic discs and posterior segments is normal; no papilledema or hemorrhages  MUSCULOSKELETAL:  Gait, strength, tone, movements noted in Neurologic exam below  NEUROLOGIC: MENTAL  STATUS:  No flowsheet data found.  awake, alert, oriented to person, place and time  recent and remote memory intact  normal attention and concentration  language fluent, comprehension intact, naming intact,   fund of knowledge appropriate  CRANIAL NERVE:   2nd - no papilledema on fundoscopic exam  2nd, 3rd, 4th, 6th - pupils equal and reactive to light, visual fields full to confrontation, extraocular muscles intact, no nystagmus  5th - facial sensation symmetric  7th - facial strength symmetric  8th - hearing intact  9th - palate elevates symmetrically, uvula midline  11th - shoulder shrug symmetric  12th - tongue protrusion midline  MOTOR:   normal bulk and tone, full strength in the BUE, BLE  SENSORY:   normal and symmetric to light touch, temperature, vibration  COORDINATION:   finger-nose-finger, fine finger movements normal  REFLEXES:   deep tendon reflexes present and symmetric  GAIT/STATION:   narrow based gait; able to walk on toes, heels and tandem; romberg is negative    DIAGNOSTIC DATA (LABS, IMAGING, TESTING) - I reviewed patient records, labs, notes, testing and imaging myself where available.  Lab Results  Component Value Date   HGB 15.3 (H) 12/17/2017   HCT 45.0 12/17/2017      Component Value Date/Time   NA 139 12/17/2017 0707   K 3.8 12/17/2017 0707   CL 102 12/17/2017 0707   GLUCOSE 90 12/17/2017 0707   BUN 9 12/17/2017 0707   CREATININE 0.60 12/17/2017 0707   No results found for: CHOL, HDL, LDLCALC, LDLDIRECT, TRIG, CHOLHDL No results found for: XBJY7W No results found for: VITAMINB12 Lab Results  Component Value Date   TSH 3.229 12/17/2017    12/17/17 CT head  - Normal noncontrast head CT.    ASSESSMENT AND PLAN  32 y.o. year old female here with intermittent positional vertigo, sometimes associated with headaches with migraine features.  Also with some fatigue and balance difficulty.  We will proceed with  further workup.  May consider migraine treatment in the future.   Ddx: vestibular migraine, peripheral vestibulopathy, BPV, CNS autoimmune, inflamm  1. Other peripheral vertigo, unspecified ear      PLAN: - check MRI brain / IAC - monitor symptoms - meclizine as needed  Orders Placed This Encounter  Procedures  . MR BRAIN/IAC W WO CONTRAST   Return if symptoms worsen or fail to improve. pending test results and symptoms    Suanne Marker, MD 03/20/2018, 11:30 AM Certified in Neurology, Neurophysiology and Neuroimaging  [facility] [address] [place], [place] [address] [phone]

## 2018-05-14 ENCOUNTER — Ambulatory Visit: Admitting: Allergy

## 2018-05-14 ENCOUNTER — Ambulatory Visit (INDEPENDENT_AMBULATORY_CARE_PROVIDER_SITE_OTHER): Payer: Self-pay | Admitting: Nurse Practitioner

## 2018-05-14 ENCOUNTER — Encounter: Payer: Self-pay | Admitting: Nurse Practitioner

## 2018-05-14 VITALS — BP 102/68 | HR 92 | Temp 98.9°F | Ht 62.0 in | Wt 145.0 lb

## 2018-05-14 DIAGNOSIS — Z0000 Encounter for general adult medical examination without abnormal findings: Secondary | ICD-10-CM

## 2018-05-14 DIAGNOSIS — J302 Other seasonal allergic rhinitis: Secondary | ICD-10-CM

## 2018-05-14 NOTE — Progress Notes (Signed)
Subjective:  [name] [name] is a 32 y.o. female who presents for basic physical exam.  The patient does not have any concerns today, but needs his physical as part of the [place] requirement for insurance premiums.  Patient denies any current health related concerns.    There is no immunization history on file for this patient.  Past Medical History:  Diagnosis Date  . ADHD   . Asthma   . UTI (urinary tract infection)    hx of freq in 2018    Past Surgical History:  Procedure Laterality Date  . LEEP  2010  . WISDOM TOOTH EXTRACTION  2008    Social History   Tobacco Use  . Smoking status: Never Smoker  . Smokeless tobacco: Never Used  Substance Use Topics  . Alcohol use: Yes    Comment: 1-2 glasses wine/month  . Drug use: No    Allergies  Allergen Reactions  . Other     Tree nuts   . Peanut-Containing Drug Products Anaphylaxis  . Corn-Containing Products   . Soy Allergy   . Tobramycin Other (See Comments)    Caused dizziness  . Ketoconazole Rash    Current Outpatient Medications  Medication Sig Dispense Refill  . albuterol (PROAIR HFA) 108 (90 Base) MCG/ACT inhaler Inhale 2 puffs into the lungs every 6 (six) hours as needed for wheezing or shortness of breath.    . DiphenhydrAMINE HCl (BENADRYL ALLERGY PO) Take by mouth as needed.    . famotidine (PEPCID) 20 MG tablet Take 1 tablet (20 mg total) by mouth daily. 90 tablet 1  . fluticasone (FLONASE) 50 MCG/ACT nasal spray Place into both nostrils as needed for allergies or rhinitis.    Marland Kitchen loratadine (CLARITIN) 10 MG tablet Take 1 tablet (10 mg total) by mouth daily. (Patient taking differently: Take 10 mg by mouth daily as needed for allergies. ) 30 tablet 5  . meclizine (ANTIVERT) 25 MG tablet Take 1 tablet (25 mg total) by mouth 3 (three) times daily as needed for dizziness. 30 tablet 0  . montelukast (SINGULAIR) 10 MG tablet Take 1 tablet (10 mg total) by mouth at bedtime. 90 tablet 1   No current  facility-administered medications for this visit.     Review of Systems  Constitutional: Negative.   HENT: Negative.   Eyes: Negative.   Respiratory: Negative.   Cardiovascular: Negative.   Gastrointestinal: Negative.   Genitourinary: Negative.   Musculoskeletal: Negative.   Skin: Negative.   Neurological: Negative.   Endo/Heme/Allergies: Positive for environmental allergies (environmental and food allergies).  Psychiatric/Behavioral: Negative.     Objective:  BP 102/68   Pulse 92   Temp 98.9 F (37.2 C)   Ht 5\' 2"  (1.575 m)   Wt 145 lb (65.8 kg)   SpO2 98%   BMI 26.52 kg/m   General Appearance:  Alert, cooperative, no distress, appears stated age  Head:  Normocephalic, without obvious abnormality, atraumatic  Eyes:  PERRL, conjunctiva/corneas clear, EOM's intact, fundi benign, both eyes  Ears:  Normal TM's and external ear canals, both ears  Nose: Nares normal, septum midline,mucosa normal, no drainage or sinus tenderness  Throat: Lips, mucosa, and tongue normal; teeth and gums normal  Neck: Supple, symmetrical, trachea midline, no adenopathy;  thyroid: not enlarged, symmetric, no tenderness/mass/nodules; no carotid bruit or JVD  Back:   Symmetric, no curvature, ROM normal, no CVA tenderness  Lungs:   Clear to auscultation bilaterally, respirations unlabored  Breasts:  Deferred  Heart:  Regular rate and rhythm, S1 and S2 normal, no murmur, rub, or gallop  Abdomen:   Soft, non-tender, bowel sounds active all four quadrants,  no masses, no organomegaly  Pelvic: Deferred  Extremities: Extremities normal, atraumatic, no cyanosis or edema  Pulses: 2+ and symmetric  Skin: Skin color, texture, turgor normal, no rashes or lesions  Lymph nodes: Cervical and submandibular nodes normal  Neurologic: Normal     Assessment:  basic physical exam    Plan:  Patient education provided.  No labs needed at this time.  Patient given patient education for health maintenance and  health prevention.  Patient will follow up with PCP.

## 2018-05-14 NOTE — Patient Instructions (Signed)
Preventive Care 18-39 Years, Female Preventive care refers to lifestyle choices and visits with your health care provider that can promote health and wellness. What does preventive care include?  A yearly physical exam. This is also called an annual well check.  Dental exams once or twice a year.  Routine eye exams. Ask your health care provider how often you should have your eyes checked.  Personal lifestyle choices, including: ? Daily care of your teeth and gums. ? Regular physical activity. ? Eating a healthy diet. ? Avoiding tobacco and drug use. ? Limiting alcohol use. ? Practicing safe sex. ? Taking vitamin and mineral supplements as recommended by your health care provider. What happens during an annual well check? The services and screenings done by your health care provider during your annual well check will depend on your age, overall health, lifestyle risk factors, and family history of disease. Counseling Your health care provider may ask you questions about your:  Alcohol use.  Tobacco use.  Drug use.  Emotional well-being.  Home and relationship well-being.  Sexual activity.  Eating habits.  Work and work Statistician.  Method of birth control.  Menstrual cycle.  Pregnancy history.  Screening You may have the following tests or measurements:  Height, weight, and BMI.  Diabetes screening. This is done by checking your blood sugar (glucose) after you have not eaten for a while (fasting).  Blood pressure.  Lipid and cholesterol levels. These may be checked every 5 years starting at age 66.  Skin check.  Hepatitis C blood test.  Hepatitis B blood test.  Sexually transmitted disease (STD) testing.  BRCA-related cancer screening. This may be done if you have a family history of breast, ovarian, tubal, or peritoneal cancers.  Pelvic exam and Pap test. This may be done every 3 years starting at age 40. Starting at age 59, this may be done every 5  years if you have a Pap test in combination with an HPV test.  Discuss your test results, treatment options, and if necessary, the need for more tests with your health care provider. Vaccines Your health care provider may recommend certain vaccines, such as:  Influenza vaccine. This is recommended every year.  Tetanus, diphtheria, and acellular pertussis (Tdap, Td) vaccine. You may need a Td booster every 10 years.  Varicella vaccine. You may need this if you have not been vaccinated.  HPV vaccine. If you are 69 or younger, you may need three doses over 6 months.  Measles, mumps, and rubella (MMR) vaccine. You may need at least one dose of MMR. You may also need a second dose.  Pneumococcal 13-valent conjugate (PCV13) vaccine. You may need this if you have certain conditions and were not previously vaccinated.  Pneumococcal polysaccharide (PPSV23) vaccine. You may need one or two doses if you smoke cigarettes or if you have certain conditions.  Meningococcal vaccine. One dose is recommended if you are age 27-21 years and a first-year college student living in a residence hall, or if you have one of several medical conditions. You may also need additional booster doses.  Hepatitis A vaccine. You may need this if you have certain conditions or if you travel or work in places where you may be exposed to hepatitis A.  Hepatitis B vaccine. You may need this if you have certain conditions or if you travel or work in places where you may be exposed to hepatitis B.  Haemophilus influenzae type b (Hib) vaccine. You may need this if  you have certain risk factors.  Talk to your health care provider about which screenings and vaccines you need and how often you need them. This information is not intended to replace advice given to you by your health care provider. Make sure you discuss any questions you have with your health care provider. Document Released: 01/22/2002 Document Revised: 08/15/2016  Document Reviewed: 09/27/2015 Elsevier Interactive Patient Education  2018 [place] [employer], Female Adopting a healthy lifestyle and getting preventive care can go a long way to promote health and wellness. Talk with your health care provider about what schedule of regular examinations is right for you. This is a good chance for you to check in with your provider about disease prevention and staying healthy. In between checkups, there are plenty of things you can do on your own. Experts have done a lot of research about which lifestyle changes and preventive measures are most likely to keep you healthy. Ask your health care provider for more information. Weight and diet Eat a healthy diet  Be sure to include plenty of vegetables, fruits, low-fat dairy products, and lean protein.  Do not eat a lot of foods high in solid fats, added sugars, or salt.  Get regular exercise. This is one of the most important things you can do for your health. ? Most adults should exercise for at least 150 minutes each week. The exercise should increase your heart rate and make you sweat (moderate-intensity exercise). ? Most adults should also do strengthening exercises at least twice a week. This is in addition to the moderate-intensity exercise.  Maintain a healthy weight  Body mass index (BMI) is a measurement that can be used to identify possible weight problems. It estimates body fat based on height and weight. Your health care provider can help determine your BMI and help you achieve or maintain a healthy weight.  For females 64 years of age and older: ? A BMI below 18.5 is considered underweight. ? A BMI of 18.5 to 24.9 is normal. ? A BMI of 25 to 29.9 is considered overweight. ? A BMI of 30 and above is considered obese.  Watch levels of cholesterol and blood lipids  You should start having your blood tested for lipids and cholesterol at 34 years of age, then have this test every 5  years.  You may need to have your cholesterol levels checked more often if: ? Your lipid or cholesterol levels are high. ? You are older than 34 years of age. ? You are at high risk for heart disease.  Cancer screening Lung Cancer  Lung cancer screening is recommended for adults 68-63 years old who are at high risk for lung cancer because of a history of smoking.  A yearly low-dose CT scan of the lungs is recommended for people who: ? Currently smoke. ? Have quit within the past 15 years. ? Have at least a 30-pack-year history of smoking. A pack year is smoking an average of one pack of cigarettes a day for 1 year.  Yearly screening should continue until it has been 15 years since you quit.  Yearly screening should stop if you develop a health problem that would prevent you from having lung cancer treatment.  Breast Cancer  Practice breast self-awareness. This means understanding how your breasts normally appear and feel.  It also means doing regular breast self-exams. Let your health care provider know about any changes, no matter how small.  If you are in your 10s  or 30s, you should have a clinical breast exam (CBE) by a health care provider every 1-3 years as part of a regular health exam.  If you are 40 or older, have a CBE every year. Also consider having a breast X-ray (mammogram) every year.  If you have a family history of breast cancer, talk to your health care provider about genetic screening.  If you are at high risk for breast cancer, talk to your health care provider about having an MRI and a mammogram every year.  Breast cancer gene (BRCA) assessment is recommended for women who have family members with BRCA-related cancers. BRCA-related cancers include: ? Breast. ? Ovarian. ? Tubal. ? Peritoneal cancers.  Results of the assessment will determine the need for genetic counseling and BRCA1 and BRCA2 testing.  Cervical Cancer Your health care provider may  recommend that you be screened regularly for cancer of the pelvic organs (ovaries, uterus, and vagina). This screening involves a pelvic examination, including checking for microscopic changes to the surface of your cervix (Pap test). You may be encouraged to have this screening done every 3 years, beginning at age 21.  For women ages 30-65, health care providers may recommend pelvic exams and Pap testing every 3 years, or they may recommend the Pap and pelvic exam, combined with testing for human papilloma virus (HPV), every 5 years. Some types of HPV increase your risk of cervical cancer. Testing for HPV may also be done on women of any age with unclear Pap test results.  Other health care providers may not recommend any screening for nonpregnant women who are considered low risk for pelvic cancer and who do not have symptoms. Ask your health care provider if a screening pelvic exam is right for you.  If you have had past treatment for cervical cancer or a condition that could lead to cancer, you need Pap tests and screening for cancer for at least 20 years after your treatment. If Pap tests have been discontinued, your risk factors (such as having a new sexual partner) need to be reassessed to determine if screening should resume. Some women have medical problems that increase the chance of getting cervical cancer. In these cases, your health care provider may recommend more frequent screening and Pap tests.  Colorectal Cancer  This type of cancer can be detected and often prevented.  Routine colorectal cancer screening usually begins at 34 years of age and continues through 34 years of age.  Your health care provider may recommend screening at an earlier age if you have risk factors for colon cancer.  Your health care provider may also recommend using home test kits to check for hidden blood in the stool.  A small camera at the end of a tube can be used to examine your colon directly  (sigmoidoscopy or colonoscopy). This is done to check for the earliest forms of colorectal cancer.  Routine screening usually begins at age 50.  Direct examination of the colon should be repeated every 5-10 years through 34 years of age. However, you may need to be screened more often if early forms of precancerous polyps or small growths are found.  Skin Cancer  Check your skin from head to toe regularly.  Tell your health care provider about any new moles or changes in moles, especially if there is a change in a mole's shape or color.  Also tell your health care provider if you have a mole that is larger than the size of a   pencil eraser.  Always use sunscreen. Apply sunscreen liberally and repeatedly throughout the day.  Protect yourself by wearing long sleeves, pants, a wide-brimmed hat, and sunglasses whenever you are outside.  Heart disease, diabetes, and high blood pressure  High blood pressure causes heart disease and increases the risk of stroke. High blood pressure is more likely to develop in: ? People who have blood pressure in the high end of the normal range (130-139/85-89 mm Hg). ? People who are overweight or obese. ? People who are African American.  If you are 18-92 years of age, have your blood pressure checked every 3-5 years. If you are 9 years of age or older, have your blood pressure checked every year. You should have your blood pressure measured twice-once when you are at a hospital or clinic, and once when you are not at a hospital or clinic. Record the average of the two measurements. To check your blood pressure when you are not at a hospital or clinic, you can use: ? An automated blood pressure machine at a pharmacy. ? A home blood pressure monitor.  If you are between 34 years and 63 years old, ask your health care provider if you should take aspirin to prevent strokes.  Have regular diabetes screenings. This involves taking a blood sample to check your  fasting blood sugar level. ? If you are at a normal weight and have a low risk for diabetes, have this test once every three years after 34 years of age. ? If you are overweight and have a high risk for diabetes, consider being tested at a younger age or more often. Preventing infection Hepatitis B  If you have a higher risk for hepatitis B, you should be screened for this virus. You are considered at high risk for hepatitis B if: ? You were born in a country where hepatitis B is common. Ask your health care provider which countries are considered high risk. ? Your parents were born in a high-risk country, and you have not been immunized against hepatitis B (hepatitis B vaccine). ? You have HIV or AIDS. ? You use needles to inject street drugs. ? You live with someone who has hepatitis B. ? You have had sex with someone who has hepatitis B. ? You get hemodialysis treatment. ? You take certain medicines for conditions, including cancer, organ transplantation, and autoimmune conditions.  Hepatitis C  Blood testing is recommended for: ? Everyone born from [birth year] through 1967. ? Anyone with known risk factors for hepatitis C.  Sexually transmitted infections (STIs)  You should be screened for sexually transmitted infections (STIs) including gonorrhea and chlamydia if: ? You are sexually active and are younger than 34 years of age. ? You are older than 34 years of age and your health care provider tells you that you are at risk for this type of infection. ? Your sexual activity has changed since you were last screened and you are at an increased risk for chlamydia or gonorrhea. Ask your health care provider if you are at risk.  If you do not have HIV, but are at risk, it may be recommended that you take a prescription medicine daily to prevent HIV infection. This is called pre-exposure prophylaxis (PrEP). You are considered at risk if: ? You are sexually active and do not regularly use condoms  or know the HIV status of your partner(s). ? You take drugs by injection. ? You are sexually active with a partner who has HIV.  Talk with your health care provider about whether you are at high risk of being infected with HIV. If you choose to begin PrEP, you should first be tested for HIV. You should then be tested every 3 months for as long as you are taking PrEP. Pregnancy  If you are premenopausal and you may become pregnant, ask your health care provider about preconception counseling.  If you may become pregnant, take 400 to 800 micrograms (mcg) of folic acid every day.  If you want to prevent pregnancy, talk to your health care provider about birth control (contraception). Osteoporosis and menopause  Osteoporosis is a disease in which the bones lose minerals and strength with aging. This can result in serious bone fractures. Your risk for osteoporosis can be identified using a bone density scan.  If you are 60 years of age or older, or if you are at risk for osteoporosis and fractures, ask your health care provider if you should be screened.  Ask your health care provider whether you should take a calcium or vitamin D supplement to lower your risk for osteoporosis.  Menopause may have certain physical symptoms and risks.  Hormone replacement therapy may reduce some of these symptoms and risks. Talk to your health care provider about whether hormone replacement therapy is right for you. Follow these instructions at home:  Schedule regular health, dental, and eye exams.  Stay current with your immunizations.  Do not use any tobacco products including cigarettes, chewing tobacco, or electronic cigarettes.  If you are pregnant, do not drink alcohol.  If you are breastfeeding, limit how much and how often you drink alcohol.  Limit alcohol intake to no more than 1 drink per day for nonpregnant women. One drink equals 12 ounces of beer, 5 ounces of wine, or 1 ounces of hard  liquor.  Do not use street drugs.  Do not share needles.  Ask your health care provider for help if you need support or information about quitting drugs.  Tell your health care provider if you often feel depressed.  Tell your health care provider if you have ever been abused or do not feel safe at home. This information is not intended to replace advice given to you by your health care provider. Make sure you discuss any questions you have with your health care provider. Document Released: 06/11/2011 Document Revised: 05/03/2016 Document Reviewed: 08/30/2015 Elsevier Interactive Patient Education  [employer].

## 2018-05-19 ENCOUNTER — Ambulatory Visit (INDEPENDENT_AMBULATORY_CARE_PROVIDER_SITE_OTHER): Payer: No Typology Code available for payment source | Admitting: Allergy

## 2018-05-19 ENCOUNTER — Encounter: Payer: Self-pay | Admitting: Allergy

## 2018-05-19 VITALS — BP 112/64 | HR 80 | Resp 20

## 2018-05-19 DIAGNOSIS — J3089 Other allergic rhinitis: Secondary | ICD-10-CM | POA: Diagnosis not present

## 2018-05-19 DIAGNOSIS — T7800XD Anaphylactic reaction due to unspecified food, subsequent encounter: Secondary | ICD-10-CM

## 2018-05-19 DIAGNOSIS — T781XXD Other adverse food reactions, not elsewhere classified, subsequent encounter: Secondary | ICD-10-CM | POA: Diagnosis not present

## 2018-05-19 DIAGNOSIS — J4520 Mild intermittent asthma, uncomplicated: Secondary | ICD-10-CM | POA: Diagnosis not present

## 2018-05-19 MED ORDER — MONTELUKAST SODIUM 5 MG PO CHEW: 5.0000 mg | CHEWABLE_TABLET | Freq: Every day | ORAL | 0 refills | Status: DC

## 2018-05-19 MED ORDER — EPINEPHRINE 0.3 MG/0.3ML IJ SOAJ: 0.3000 mg | Freq: Once | INTRAMUSCULAR | 2 refills | Status: AC

## 2018-05-19 NOTE — Progress Notes (Signed)
Follow-up Note  RE: [name] [name] MRN: [id number] DOB: October 16, 1986 Date of Office Visit: 05/19/2018   History of present illness: [name] Dauerlysa Asbridge is a 32 y.o. female presenting today for follow-up of food allergies.  She also has a history of asthma, allergic rhinitis and pollen food allergy syndrome.  She was last seen in the office on December 05, 2017 by myself.  Since last visit, patient states she has had a reaction about 2 to 3 weeks ago to a [employer] bread cheese danish where she felt like she was about to die; she had symptoms of feeling hot all over and feeling of throat swelling that began almost immediately after eating the pastry; where she usually will need to take 12.5-25mg  of benadryl for similar reactions to other foods, she felt that she had to take 50mg  on that day. Her symptoms were largely resolved in about 20 minutes. She denies lip swelling, tongue swelling, nausea, diarrhea, hives. She states that her food reactions seem to be intensifying - [place] and started having reactions they would mainly be oral itching; she denies any food or other allergies prior to 2017 and prior to moving to [place]. She has never had to use an epi pen; she has several epi pens always available to her and would like another refill. Patient does not take daily anti-histamine or H2 blocker because she does not want to cover up symptoms in case she has a severe reaction.   She did go to nutritionist after her last visit however she did not feel that this was very helpful for her.  Patient feels that singulair is helping her exercise induced symptoms but does have some dizziness with the full-strength dose.  She has not required any oral steroid needs, either urgent care visits for her breathing.   Labwork at last visit showed elevated IgE to corn, soybean, peanut, almond and hazelnut. She was advised to avoid corn, soy, peanut, and tree nuts.      Review of systems: Review of  Systems  Constitutional: Negative for chills, fever and malaise/fatigue.  HENT: Negative for congestion, ear discharge, ear pain, nosebleeds and sore throat.   Eyes: Negative for pain, discharge and redness.  Respiratory: Negative for cough, shortness of breath and wheezing.   Cardiovascular: Negative for chest pain.  Gastrointestinal: Negative for abdominal pain, constipation, diarrhea, heartburn, nausea and vomiting.  Musculoskeletal: Negative for joint pain.  Skin: Negative for itching and rash.  Neurological: Negative for headaches.    All other systems negative unless noted above in HPI  Past medical/social/surgical/family history have been reviewed and are unchanged unless specifically indicated below.  No changes  Medication List: Allergies as of 05/19/2018      Reactions   Other    Tree nuts    Peanut-containing Drug Products Anaphylaxis   Corn-containing Products    Soy Allergy    Tobramycin Other (See Comments)   Caused dizziness   Ketoconazole Rash      Medication List        Accurate as of 05/19/18  1:15 PM. Always use your most recent med list.          BENADRYL ALLERGY PO Take by mouth as needed.   EPINEPHrine 0.3 mg/0.3 mL Soaj injection Commonly known as:  EPIPEN 2-PAK Inject 0.3 mLs (0.3 mg total) into the muscle once for 1 dose.   famotidine 20 MG tablet Commonly known as:  PEPCID Take 1 tablet (20 mg total)  by mouth daily.   fluticasone 50 MCG/ACT nasal spray Commonly known as:  FLONASE Place into both nostrils as needed for allergies or rhinitis.   loratadine 10 MG tablet Commonly known as:  CLARITIN Take 1 tablet (10 mg total) by mouth daily.   meclizine 25 MG tablet Commonly known as:  ANTIVERT Take 1 tablet (25 mg total) by mouth 3 (three) times daily as needed for dizziness.   montelukast 5 MG chewable tablet Commonly known as:  SINGULAIR Chew 1 tablet (5 mg total) by mouth at bedtime.   PROAIR HFA 108 (90 Base) MCG/ACT  inhaler Generic drug:  albuterol Inhale 2 puffs into the lungs every 6 (six) hours as needed for wheezing or shortness of breath.       Known medication allergies: Allergies  Allergen Reactions  . Other     Tree nuts   . Peanut-Containing Drug Products Anaphylaxis  . Corn-Containing Products   . Soy Allergy   . Tobramycin Other (See Comments)    Caused dizziness  . Ketoconazole Rash     Physical examination: Blood pressure 112/64, pulse 80, resp. rate 20, SpO2 98 %.  General: Alert, interactive, in no acute distress. HEENT: PERRLA, TMs pearly gray, turbinates minimally edematous without discharge, post-pharynx non erythematous. Neck: Supple without lymphadenopathy. Lungs: Clear to auscultation without wheezing, rhonchi or rales. {no increased work of breathing. CV: Normal S1, S2 without murmurs. Abdomen: Nondistended, nontender. Skin: Warm and dry, without lesions or rashes. Extremities:  No clubbing, cyanosis or edema. Neuro:   Grossly intact.  Diagnositics/Labs: Labs:  Component     Latest Ref Rng & Units 12/05/2017  Class Description      Comment  Peanut IgE     Class 0/I kU/L 0.29 (A)  Hazelnut ([place]) IgE     Class IV kU/L 10.20 (A)  [place] Nut IgE     Class 0 kU/L <0.10  F020-IgE Almond     Class II kU/L 0.80 (A)  Pecan Nut IgE     Class 0 kU/L <0.10  F202-IgE Cashew Nut     Class 0 kU/L <0.10  Walnut IgE     Class 0 kU/L <0.10  Wheat IgE     Class 0 kU/L <0.10  Allergen Corn, IgE     Class 0/I kU/L 0.11 (A)  Soybean IgE     Class 0/I kU/L 0.25 (A)  Allergen Green Pea IgE     Class 0 kU/L <0.10  Tryptase     2.2 - 13.2 ug/L 2.1 (L)  K083-IgE Cottonseed     Class 0 kU/L <0.10    Spirometry: FEV1: 2.43L  96%, FVC: 2.6L  87%, ratio consistent with Nonobstructive pattern  Assessment and plan:   Food allergy  -  recent reaction to a cheese [place] at a [employer]	[place].  Review of ingredients of this [place] does not reveal any allergens that  she has a known allergy to.  There is concern for how the [place] was processed and made with possible cross-contamination leading to reaction.  We discussed at length today in regards to what symptoms warrant EpiPen use in which symptoms warrant antihistamine use only.  I advised that given her constellation of symptoms that she reported it would have been best advised to have use her EpiPen at that time.  She voiced understanding of when to use her EpiPen.  We will refill these today as her current EpiPen's have expired.  I also have advised that she take a daily  antihistamine and Singulair to help reduce risk of having reactions in the future. Mild intermittent asthma - mostly related to activity.  She did notice improvement in symptoms with the Singulair however she had an episode of dizziness and is unsure if it was related to the Singulair or not.  She is requesting to try the 5 mg dose which I have approved and she will monitor if it causes her to have any dizziness and if so she will discontinue use.  She has access to her albuterol. Allergic rhinitis -she will take daily antihistamine as well as Singulair at this time Pollen food allergy syndrome   1. Environmental Allergen avoidance measures for grasses, weeds, trees,molds, and cat.  Food allergy avoidance measures for peanut, soybean, corn, green peas, cottonseed, hazelnut, [place] nut.    2. Epipen 0.3, Benadryl, M.D./ER evaluation for allergic reaction.  Follow emergency action plan in case of allergic reaction (previously provided)  3. Can utilize a preventative plan including Xyzal 5 mg + Singulair 5mg   4. Can continue to use albuterol MDI 2 inhalations before exercise and as needed every 4-6 hours for cough/wheeze/shortness of breath/chest tightness symptoms  5. Will plan for in-office challenge after 07/04/18 for corn.  Hold antihistamines for 3 days prior to challenge.  Will perform skin prick testing prior to challenge  Follow-up  after 07/04/18 for challenge  or sooner if needed   I appreciate the opportunity to take part in Kindal's care. Please do not hesitate to contact me with questions.  Sincerely,   Margo Aye, MD Allergy/Immunology [facility]

## 2018-05-19 NOTE — Patient Instructions (Addendum)
  1. Environmental Allergen avoidance measures for grasses, weeds, trees,molds, and cat.  Food allergy avoidance measures for peanut, soybean, corn, green peas, cottonseed, hazelnut, [place] nut.    2. Epipen 0.3, Benadryl, M.D./ER evaluation for allergic reaction.  Follow emergency action plan in case of allergic reaction  3. Can utilize a preventative plan including Xyzal 5 mg + Singulair 5mg   4. Can continue to use albuterol MDI 2 inhalations before exercise and as needed every 4-6 hours for cough/wheeze/shortness of breath/chest tightness symptoms  5. Will plan for in-office challenge after 07/04/18 for corn.  Hold antihistamines for 3 days prior to challenge.  Will perform skin prick testing prior to challenge  Follow-up after 07/04/18 for challenge  or sooner if needed

## 2018-07-10 ENCOUNTER — Telehealth: Payer: Self-pay | Admitting: Allergy

## 2018-07-10 ENCOUNTER — Encounter: Payer: Self-pay | Admitting: Allergy

## 2018-07-10 ENCOUNTER — Ambulatory Visit (INDEPENDENT_AMBULATORY_CARE_PROVIDER_SITE_OTHER): Admitting: Allergy

## 2018-07-10 VITALS — BP 118/70 | HR 72 | Resp 18

## 2018-07-10 DIAGNOSIS — T7800XD Anaphylactic reaction due to unspecified food, subsequent encounter: Secondary | ICD-10-CM

## 2018-07-10 NOTE — Telephone Encounter (Signed)
Olegario MessierKathy I wanted to let you know that tricare has been down all day so I can not verify her tricare. But I will try again tomorrow. Thank you patsy

## 2018-07-10 NOTE — Progress Notes (Signed)
Follow-up Note  RE: [name] [name] MRN: [id number] DOB: 1986/07/18 Date of Office Visit: 07/10/2018   History of present illness: [name] [name] is a 32 y.o. female presenting today for in-office corn challenge.  She was last seen in the office on 05/19/18 by myself.  It was decided to perform a corn challenge as she has been eating doritos and corn chips without a problem but had tried eating fresh corn.  She had positive skin testing to corn in 2018 and very low IgE of 0.11 in 11/2017.  Repeat of corn prick testing today is positive at 2+ which is reduced from 3+ on prior testing.   She denies any recent illnesses or antibiotic needs and no antihistamine use in past 3 days.     Review of systems: Review of Systems  Constitutional: Negative for chills, fever and malaise/fatigue.  HENT: Negative for congestion, ear discharge, nosebleeds and sore throat.   Eyes: Negative for pain, discharge and redness.  Respiratory: Negative for cough, shortness of breath and wheezing.   Cardiovascular: Negative for chest pain.  Gastrointestinal: Negative for abdominal pain, constipation, diarrhea, heartburn, nausea and vomiting.  Musculoskeletal: Negative for joint pain.  Skin: Negative for itching and rash.  Neurological: Negative for headaches.    All other systems negative unless noted above in HPI  Past medical/social/surgical/family history have been reviewed and are unchanged unless specifically indicated below.  No changes  Medication List: Allergies as of 07/10/2018      Reactions   Other    Tree nuts    Peanut-containing Drug Products Anaphylaxis   Corn-containing Products    Soy Allergy    Tobramycin Other (See Comments)   Caused dizziness   Ketoconazole Rash      Medication List        Accurate as of 07/10/18 11:53 AM. Always use your most recent med list.          BENADRYL ALLERGY PO Take by mouth as needed.   EPINEPHrine 0.3 mg/0.3 mL Soaj injection Commonly known as:   EPI-PEN INJECT 0.3MLS INTO THE MUSCLE ONCE FOR 1 DOSE   famotidine 20 MG tablet Commonly known as:  PEPCID Take 1 tablet (20 mg total) by mouth daily.   Fluocinolone Acetonide Scalp 0.01 % Oil APP 1 APPLICATION TO THE SCALP ONCE D   fluticasone 50 MCG/ACT nasal spray Commonly known as:  FLONASE Place into both nostrils as needed for allergies or rhinitis.   loratadine 10 MG tablet Commonly known as:  CLARITIN Take 1 tablet (10 mg total) by mouth daily.   meclizine 25 MG tablet Commonly known as:  ANTIVERT Take 1 tablet (25 mg total) by mouth 3 (three) times daily as needed for dizziness.   montelukast 5 MG chewable tablet Commonly known as:  SINGULAIR Chew 1 tablet (5 mg total) by mouth at bedtime.   PROAIR HFA 108 (90 Base) MCG/ACT inhaler Generic drug:  albuterol Inhale 2 puffs into the lungs every 6 (six) hours as needed for wheezing or shortness of breath.       Known medication allergies: Allergies  Allergen Reactions  . Other     Tree nuts   . Peanut-Containing Drug Products Anaphylaxis  . Corn-Containing Products   . Soy Allergy   . Tobramycin Other (See Comments)    Caused dizziness  . Ketoconazole Rash     Physical examination: Blood pressure 118/70, pulse 72, resp. rate 18, SpO2 99 %.  General: Alert, interactive, in no acute distress.  HEENT: PERRLA, TMs pearly gray, turbinates moderately edematous with clear discharge, post-pharynx non erythematous. Neck: Supple without lymphadenopathy. Lungs: Clear to auscultation without wheezing, rhonchi or rales. {no increased work of breathing. CV: Normal S1, S2 without murmurs. Abdomen: Nondistended, nontender. Skin: Warm and dry, without lesions or rashes. Extremities:  No clubbing, cyanosis or edema. Neuro:   Grossly intact.  Diagnositics/Labs: Skin prick testing for corn is 2+ and peanut 3+ today with positive histamine control  Food challenge to corn with use of Corn kernels. Benefits and risks of  challenge discussed and verbal consent from mother obtained.  She was given first dose of 1 corn kernel.  About 5 minutes in she completed her tongue tingled where she chewed the corn and felt flushed.  Oral cavity exam unchanged from initial exam.  Vitals stable.  No visible flushed appearance to skin.  After additional 10 minutes she states the symptoms had improved but not resolved and she felt some abd discomfort but no nausea.  At that time we stopped challenge and gave dose of zyrtec and zantac.  She was observed for additional 30 minutes with resolution of symptoms.  Discharge vitals were stable.     Assessment and plan:   Food allergy  -  challenge to corn today was not successfully passed and needs to continue avoidance of corn/products.  Mild intermittent asthma   Allergic rhinitis  Pollen food allergy syndrome   1. Continue environmental allergen avoidance measures for grasses, weeds, trees,molds, and cat.  Food allergy avoidance measures for peanut, soybean, corn, green peas, cottonseed, hazelnut, [place] nut.  We did discuss immunotherapy today and provided with info packet.  She appears interested in this treatment modality.    2. Epipen 0.3, Benadryl, M.D./ER evaluation for allergic reaction.  Follow emergency action plan in case of allergic reaction (previously provided)  3. Can utilize a preventative plan including Xyzal 5 mg + Singulair 5mg   4. Can continue to use albuterol MDI 2 inhalations before exercise and as needed every 4-6 hours for cough/wheeze/shortness of breath/chest tightness symptoms  5. We did discuss oral immunotherapy as she does have a peanut allergy and reports being symptomatic with previous recent ingestions of peanut products.  Her peanut skin prick today is also relatively large and would not recommend a challenge at this time.   She will continue avoidance of all allergic foods including peanut, soybean, corn, green peas, cottonseed, hazelnut, [place] nut.      I appreciate the opportunity to take part in [name] [name]'s care. Please do not hesitate to contact me with questions.  Sincerely,   Margo Aye, MD Allergy/Immunology [facility]

## 2018-07-10 NOTE — Patient Instructions (Addendum)
1. Environmental Allergen avoidance measures for grasses, weeds, trees,molds, and cat.  Food allergy avoidance measures for peanut, soybean, corn, green peas, cottonseed, hazelnut, [place] nut.    2. Epipen 0.3, Benadryl, M.D./ER evaluation for allergic reaction.  Follow emergency action plan in case of allergic reaction (previously provided)  3. Can utilize a preventative plan including Xyzal 5 mg + Singulair 5mg   4. Can continue to use albuterol MDI 2 inhalations before exercise and as needed every 4-6 hours for cough/wheeze/shortness of breath/chest tightness symptoms  5. Will plan for in-office challenge after 07/04/18 for corn.  Hold antihistamines for 3 days prior to challenge.  Will perform skin prick testing prior to challenge  Follow-up after 07/04/18 for challenge  or sooner if needed

## 2018-09-09 ENCOUNTER — Telehealth: Payer: Self-pay | Admitting: Physician Assistant

## 2018-09-09 DIAGNOSIS — N3000 Acute cystitis without hematuria: Secondary | ICD-10-CM

## 2018-09-09 MED ORDER — NITROFURANTOIN MONOHYD MACRO 100 MG PO CAPS: 100.0000 mg | ORAL_CAPSULE | Freq: Two times a day (BID) | ORAL | 0 refills | Status: AC

## 2018-09-09 NOTE — Progress Notes (Signed)

## 2018-09-23 ENCOUNTER — Other Ambulatory Visit: Payer: Self-pay | Admitting: Family Medicine

## 2018-09-23 DIAGNOSIS — Z8639 Personal history of other endocrine, nutritional and metabolic disease: Secondary | ICD-10-CM

## 2018-10-17 ENCOUNTER — Other Ambulatory Visit: Payer: Self-pay

## 2018-10-21 ENCOUNTER — Encounter: Payer: Self-pay | Admitting: Allergy and Immunology

## 2018-10-21 ENCOUNTER — Ambulatory Visit (INDEPENDENT_AMBULATORY_CARE_PROVIDER_SITE_OTHER): Payer: No Typology Code available for payment source | Admitting: Allergy and Immunology

## 2018-10-21 VITALS — BP 100/70 | HR 66 | Temp 98.2°F | Resp 20 | Ht 61.7 in | Wt 148.6 lb

## 2018-10-21 DIAGNOSIS — J3089 Other allergic rhinitis: Secondary | ICD-10-CM | POA: Diagnosis not present

## 2018-10-21 DIAGNOSIS — J4530 Mild persistent asthma, uncomplicated: Secondary | ICD-10-CM | POA: Diagnosis not present

## 2018-10-21 DIAGNOSIS — K219 Gastro-esophageal reflux disease without esophagitis: Secondary | ICD-10-CM

## 2018-10-21 DIAGNOSIS — T781XXD Other adverse food reactions, not elsewhere classified, subsequent encounter: Secondary | ICD-10-CM

## 2018-10-21 DIAGNOSIS — Z91018 Allergy to other foods: Secondary | ICD-10-CM | POA: Diagnosis not present

## 2018-10-21 MED ORDER — MONTELUKAST SODIUM 5 MG PO CHEW: 5.0000 mg | CHEWABLE_TABLET | Freq: Every day | ORAL | 5 refills | Status: DC

## 2018-10-21 MED ORDER — FLUTICASONE PROPIONATE HFA 110 MCG/ACT IN AERO: 2.0000 | INHALATION_SPRAY | Freq: Two times a day (BID) | RESPIRATORY_TRACT | 5 refills | Status: DC

## 2018-10-21 MED ORDER — FAMOTIDINE 40 MG PO TABS: 40.0000 mg | ORAL_TABLET | Freq: Two times a day (BID) | ORAL | 5 refills | Status: DC

## 2018-10-21 NOTE — Patient Instructions (Addendum)
  1.  Continue to perform allergen avoidance measures as best as possible  2.  Auvi-Q 0.3 / EpiPen , Benadryl, M.D./ER evaluation for allergic reaction  3.  Every day utilize the following medications:   A.  Flonase 1-2 sprays each nostril daily  B.  Flovent 110 -2 inhalations daily  C.  Montelukast 10 mg -1 tablet daily  D.  Cetirizine/Zyrtec 10 mg -1 tablet at PM  E.  Loratadine/Claritin 10 mg -2 tablets at AM  F.  Famotidine 40 mg -1 tablet at AM and PM  4. Can continue to use albuterol MDI 2 inhalations before exercise or as needed  5. Start a course of immunotherapy  6. Return to clinic in January 2020 or earlier if problem

## 2018-10-21 NOTE — Progress Notes (Signed)
Follow-up Note  Referring Provider: Iva BoopVia, Kevin, MD Primary Provider: Iva BoopVia, Kevin, MD Date of Office Visit: 10/21/2018  Subjective:   [name] DauerElysa [name] (DOB: 16-Mar-1986) is a 32 y.o. female who [facility] in re-evaluation of the following:  HPI: [name] [name] presents to this clinic in reevaluation of her asthma and allergic rhinitis and oral allergy syndrome and food allergy.  I have not seen her in this clinic since her initial evaluation of 04 July 2017.  She has seen Dr. Delorse LekPadgett on several occasions.  She has very significant oral allergy syndrome and is very limited in her ability to eat without precipitating significant mouth and throat problems.  She has tried to manipulate her diet as best as possible and now relies on the use of chicken and rice and string beans and salads and spaghetti with meat sauce and fish and some wheat products.  She has seen a nutritionist but apparently that interaction has not been particularly helpful.  She is getting some significant anxiety whenever she tries a new food for the fear of developing these mouth and throat symptoms.  Her airway is doing relatively well but she has had some problems recently with exercise-induced chest tightness and some shortness of breath.  This occurs even though she uses montelukast a few times a week usually on the days that she exercises.  Her nose has not been causing her tremendous problem while using Flonase on a pretty regular basis.  She does use Zyrtec as well but she can only use it at nighttime as it does make her somewhat sleepy.  She had an issue with heartburn and some abdominal pain that comes and goes and has flared up to the past few weeks and she is now on famotidine every day which does work quite well.  Allergies as of 10/21/2018      Reactions   Other    Tree nuts    Peanut-containing Drug Products Anaphylaxis   Corn-containing Products    Soy Allergy    Tobramycin  Other (See Comments)   Caused dizziness   Ketoconazole Rash      Medication List      BENADRYL ALLERGY PO Take by mouth as needed.   EPINEPHrine 0.3 mg/0.3 mL Soaj injection Commonly known as:  EPI-PEN INJECT 0.3MLS INTO THE MUSCLE ONCE FOR 1 DOSE   famotidine 40 MG tablet Commonly known as:  PEPCID Take 1 tablet (40 mg total) by mouth 2 (two) times daily.   Fluocinolone Acetonide Scalp 0.01 % Oil APP 1 APPLICATION TO THE SCALP ONCE D   fluticasone 110 MCG/ACT inhaler Commonly known as:  FLOVENT HFA Inhale 2 puffs into the lungs 2 (two) times daily.   fluticasone 50 MCG/ACT nasal spray Commonly known as:  FLONASE Place into both nostrils as needed for allergies or rhinitis.   loratadine 10 MG tablet Commonly known as:  CLARITIN Take 1 tablet (10 mg total) by mouth daily.   meclizine 25 MG tablet Commonly known as:  ANTIVERT Take 1 tablet (25 mg total) by mouth 3 (three) times daily as needed for dizziness.   montelukast 5 MG chewable tablet Commonly known as:  SINGULAIR Chew 1 tablet (5 mg total) by mouth at bedtime.   PROAIR HFA 108 (90 Base) MCG/ACT inhaler Generic drug:  albuterol Inhale 2 puffs into the lungs every 6 (six) hours as needed for wheezing or shortness of breath.   Selenium Sulfide 2.3 % Sham  APP 1 APPLICATION TO THE SCALP QD PRF DANDRUFF       Past Medical History:  Diagnosis Date  . ADHD   . Angio-edema   . Asthma   . UTI (urinary tract infection)    hx of freq in 2018    Past Surgical History:  Procedure Laterality Date  . LEEP  2010  . WISDOM TOOTH EXTRACTION  2008    Review of systems negative except as noted in HPI / PMHx or noted below:  Review of Systems  Constitutional: Negative.   HENT: Negative.   Eyes: Negative.   Respiratory: Negative.   Cardiovascular: Negative.   Gastrointestinal: Negative.   Genitourinary: Negative.   Musculoskeletal: Negative.   Skin: Negative.   Neurological: Negative.     Endo/Heme/Allergies: Negative.   Psychiatric/Behavioral: Negative.      Objective:   Vitals:   10/21/18 1043  BP: 100/70  Pulse: 66  Resp: 20  Temp: 98.2 F (36.8 C)  SpO2: 98%   Height: 5' 1.7" (156.7 cm)  Weight: 148 lb 9.6 oz (67.4 kg)   Physical Exam  HENT:  Head: Normocephalic.  Right Ear: Tympanic membrane, external ear and ear canal normal.  Left Ear: Tympanic membrane, external ear and ear canal normal.  Nose: Nose normal. No mucosal edema or rhinorrhea.  Mouth/Throat: Uvula is midline, oropharynx is clear and moist and mucous membranes are normal. No oropharyngeal exudate.  Eyes: Conjunctivae are normal.  Neck: Trachea normal. No tracheal tenderness present. No tracheal deviation present. No thyromegaly present.  Cardiovascular: Normal rate, regular rhythm, S1 normal, S2 normal and normal heart sounds.  No murmur heard. Pulmonary/Chest: Breath sounds normal. No stridor. No respiratory distress. She has no wheezes. She has no rales.  Musculoskeletal: She exhibits no edema.  Lymphadenopathy:       Head (right side): No tonsillar adenopathy present.       Head (left side): No tonsillar adenopathy present.    She has no cervical adenopathy.  Neurological: She is alert.  Skin: No rash noted. She is not diaphoretic. No erythema. Nails show no clubbing.    Diagnostics:    Spirometry was performed and demonstrated an FEV1 of 2.31 at 95 % of predicted.  Assessment and Plan:   1. Not well controlled mild persistent asthma   2. Other allergic rhinitis   3. Pollen-food allergy, subsequent encounter   4. Food allergy   5. Gastroesophageal reflux disease, esophagitis presence not specified     1.  Continue to perform allergen avoidance measures as best as possible  2.  Auvi-Q 0.3 / EpiPen , Benadryl, M.D./ER evaluation for allergic reaction  3.  Every day utilize the following medications:   A.  Flonase 1-2 sprays each nostril daily  B.  Flovent 110 -2  inhalations daily  C.  Montelukast 10 mg -1 tablet daily  D.  Cetirizine/Zyrtec 10 mg -1 tablet at PM  E.  Loratadine/Claritin 10 mg -2 tablets at AM  F.  Famotidine 40 mg -1 tablet at AM and PM  4. Can continue to use albuterol MDI 2 inhalations before exercise or as needed  5. Start a course of immunotherapy  6. Return to clinic in January 2020 or earlier if problem  I think in the long run Roben needs to use a course of immunotherapy directed against pollen hypersensitivity to help her oral allergy syndrome and she will get that arranged sometime in the near future.  For now we will give her a collection  of H1 and H2 receptor blockers and a leukotriene modifier and nasal and inhaled steroids to help with her immune reactivity and inflammation.  She will keep in contact with me noting her response to this approach.  I will see her back in this clinic in January 2020 or earlier if there is a problem.  Laurette Schimke, MD Allergy / Immunology [facility]

## 2018-10-22 ENCOUNTER — Encounter: Payer: Self-pay | Admitting: Allergy and Immunology

## 2018-12-19 ENCOUNTER — Ambulatory Visit
Admission: RE | Admit: 2018-12-19 | Discharge: 2018-12-19 | Disposition: A | Discharge status: Home / Self Care | Source: Ambulatory Visit | Attending: Family Medicine | Admitting: Family Medicine

## 2018-12-19 ENCOUNTER — Other Ambulatory Visit: Payer: Self-pay | Admitting: Family Medicine

## 2018-12-30 ENCOUNTER — Encounter: Payer: Self-pay | Admitting: *Deleted

## 2018-12-30 ENCOUNTER — Ambulatory Visit: Payer: No Typology Code available for payment source | Admitting: Allergy and Immunology

## 2018-12-30 ENCOUNTER — Encounter: Payer: Self-pay | Admitting: Diagnostic Neuroimaging

## 2018-12-30 ENCOUNTER — Ambulatory Visit (INDEPENDENT_AMBULATORY_CARE_PROVIDER_SITE_OTHER): Payer: No Typology Code available for payment source | Admitting: Diagnostic Neuroimaging

## 2018-12-30 VITALS — BP 125/90 | HR 83 | Ht 62.0 in | Wt 144.2 lb

## 2018-12-30 DIAGNOSIS — F0781 Postconcussional syndrome: Secondary | ICD-10-CM

## 2018-12-30 MED ORDER — AMITRIPTYLINE HCL 10 MG PO TABS: 10.0000 mg | ORAL_TABLET | Freq: Every day | ORAL | 1 refills | Status: DC

## 2018-12-30 NOTE — Patient Instructions (Signed)
POST-TRAUMATIC HEADACHE / POST-CONCUSSION SYNDROME (headache, neck pain, irritability, photophobia) - optimize nutrition, exercise, rest, sleep - amitriptyline 10mg  at bedtime  POSITIONAL VERTIGO (resolved) - monitor symptoms  MIGRAINE  - consider other migraine treatments in the future
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
## 2019-01-08 ENCOUNTER — Emergency Department (HOSPITAL_COMMUNITY)
Admission: EM | Admit: 2019-01-08 | Discharge: 2019-01-08 | Disposition: A | Discharge status: Home / Self Care | Payer: No Typology Code available for payment source | Attending: Emergency Medicine | Admitting: Emergency Medicine

## 2019-01-08 ENCOUNTER — Encounter (HOSPITAL_COMMUNITY): Payer: Self-pay | Admitting: Emergency Medicine

## 2019-01-08 DIAGNOSIS — Z91010 Allergy to peanuts: Secondary | ICD-10-CM | POA: Diagnosis not present

## 2019-01-08 DIAGNOSIS — F909 Attention-deficit hyperactivity disorder, unspecified type: Secondary | ICD-10-CM | POA: Insufficient documentation

## 2019-01-08 DIAGNOSIS — T7840XA Allergy, unspecified, initial encounter: Secondary | ICD-10-CM | POA: Insufficient documentation

## 2019-01-08 DIAGNOSIS — J45909 Unspecified asthma, uncomplicated: Secondary | ICD-10-CM | POA: Diagnosis not present

## 2019-01-08 DIAGNOSIS — Z79899 Other long term (current) drug therapy: Secondary | ICD-10-CM | POA: Diagnosis not present

## 2019-01-08 LAB — BASIC METABOLIC PANEL
Anion gap: 8 (ref 5–15)
BUN: 18 mg/dL (ref 6–20)
CHLORIDE: 102 mmol/L (ref 98–111)
CO2: 26 mmol/L (ref 22–32)
Calcium: 9.1 mg/dL (ref 8.9–10.3)
Creatinine, Ser: 0.94 mg/dL (ref 0.44–1.00)
GFR calc Af Amer: >60 mL/min (ref >60)
Glucose, Bld: 134 mg/dL — ABNORMAL HIGH (ref 70–99)
POTASSIUM: 3.1 mmol/L — AB (ref 3.5–5.1)
SODIUM: 136 mmol/L (ref 135–145)

## 2019-01-08 LAB — I-STAT BETA HCG BLOOD, ED (MC, WL, AP ONLY): I-stat hCG, quantitative: <5 m[IU]/mL (ref <5)

## 2019-01-08 LAB — CBC WITH DIFFERENTIAL/PLATELET
Abs Immature Granulocytes: 0.02 10*3/uL (ref 0.00–0.07)
Basophils Absolute: 0 10*3/uL (ref 0.0–0.1)
Basophils Relative: 1 %
EOS ABS: 0.1 10*3/uL (ref 0.0–0.5)
EOS PCT: 1 %
HEMATOCRIT: 42.1 % (ref 36.0–46.0)
Hemoglobin: 13.3 g/dL (ref 12.0–15.0)
Immature Granulocytes: 0 %
LYMPHS PCT: 44 %
Lymphs Abs: 3.3 10*3/uL (ref 0.7–4.0)
MCH: 30.4 pg (ref 26.0–34.0)
MCHC: 31.6 g/dL (ref 30.0–36.0)
MCV: 96.1 fL (ref 80.0–100.0)
MONO ABS: 0.5 10*3/uL (ref 0.1–1.0)
Monocytes Relative: 7 %
NEUTROS ABS: 3.5 10*3/uL (ref 1.7–7.7)
NRBC: 0 % (ref 0.0–0.2)
Neutrophils Relative %: 47 %
Platelets: 268 10*3/uL (ref 150–400)
RBC: 4.38 MIL/uL (ref 3.87–5.11)
RDW: 12.8 % (ref 11.5–15.5)
WBC: 7.5 10*3/uL (ref 4.0–10.5)

## 2019-01-08 MED ORDER — ONDANSETRON 4 MG PO TBDP
4.0000 mg | ORAL_TABLET | Freq: Once | ORAL | Status: AC
  Administered 2019-01-08: 4 mg via ORAL
  Filled 2019-01-08: qty 1

## 2019-01-08 MED ORDER — SODIUM CHLORIDE 0.9 % IV BOLUS
1000.0000 mL | Freq: Once | INTRAVENOUS | Status: AC
  Administered 2019-01-08: 1000 mL via INTRAVENOUS

## 2019-01-08 MED ORDER — ONDANSETRON 4 MG PO TBDP: 4.0000 mg | ORAL_TABLET | Freq: Once | ORAL | Status: DC | PRN

## 2019-01-08 MED ORDER — PREDNISONE 20 MG PO TABS: ORAL_TABLET | ORAL | 0 refills | Status: DC

## 2019-01-08 MED ORDER — METHYLPREDNISOLONE SODIUM SUCC 125 MG IJ SOLR
125.0000 mg | Freq: Once | INTRAMUSCULAR | Status: AC
  Administered 2019-01-08: 125 mg via INTRAVENOUS
  Filled 2019-01-08: qty 2

## 2019-01-08 NOTE — ED Provider Notes (Signed)
[facility]-EMERGENCY DEPT Provider Note   CSN: [id number] Arrival date & time: 01/08/19  [id number]     History   Chief Complaint Chief Complaint  Patient presents with  . Allergic Reaction    HPI [name] [name] is a 33 y.o. female.  HPI  33 year old female presents after an allergic reaction.  She was eating homemade lasagna tonight and states about 5 minutes after dinner she felt like there was a lump in her throat.  Felt like there is fullness to the back of her throat like it was swelling.  No lip or tongue swelling.  No trouble breathing or speaking.  Felt itchy in her throat but no itchiness or rash anywhere else.  She tried multiple different meds including Benadryl, Pepcid, Claritin.  None of these helped, when typically Benadryl will help for this scenario.  She then gave herself her EpiPen.  She feels some partial relief and improvement since though is now feeling shaky/jittery.  She also feels a little nauseated. Overall has been having symptoms for about 1 hour.  Past Medical History:  Diagnosis Date  . ADHD   . Angio-edema   . Asthma   . UTI (urinary tract infection)    hx of freq in 2018    Patient Active Problem List   Diagnosis Date Noted  . Seasonal allergies 05/14/2018    Past Surgical History:  Procedure Laterality Date  . LEEP  2010  . WISDOM TOOTH EXTRACTION  2008     OB History   No obstetric history on file.      Home Medications    Prior to Admission medications   Medication Sig Start Date End Date Taking? Authorizing Provider  albuterol (PROAIR HFA) 108 (90 Base) MCG/ACT inhaler Inhale 2 puffs into the lungs every 6 (six) hours as needed for wheezing or shortness of breath.    [provider]  amitriptyline (ELAVIL) 10 MG tablet Take 1 tablet (10 mg total) by mouth at bedtime. 12/30/18   Penumalli, Glenford BayleyVikram R, MD  DiphenhydrAMINE HCl (BENADRYL ALLERGY PO) Take by mouth as needed.    [provider]    EPINEPHrine 0.3 mg/0.3 mL IJ SOAJ injection as needed.  07/08/18   [provider]  famotidine (PEPCID) 40 MG tablet Take 1 tablet (40 mg total) by mouth 2 (two) times daily. 10/21/18   Kozlow, Alvira PhilipsEric J, MD  Fluocinolone Acetonide Scalp 0.01 % OIL as needed.  07/08/18   [provider]  fluticasone (FLONASE) 50 MCG/ACT nasal spray Place into both nostrils as needed for allergies or rhinitis.    [provider]  loratadine (CLARITIN) 10 MG tablet Take 1 tablet (10 mg total) by mouth daily. Patient taking differently: Take 10 mg by mouth 2 (two) times daily.  07/04/17   Kozlow, Alvira PhilipsEric J, MD  meclizine (ANTIVERT) 25 MG tablet Take 1 tablet (25 mg total) by mouth 3 (three) times daily as needed for dizziness. Patient not taking: Reported on 12/30/2018 12/17/17   Antony MaduraHumes, Kelly, PA-C  montelukast (SINGULAIR) 5 MG chewable tablet Chew 1 tablet (5 mg total) by mouth at bedtime. 10/21/18   Kozlow, Alvira PhilipsEric J, MD  Selenium Sulfide 2.3 % SHAM once a week.  07/23/18   [provider]    Family History Family History  Problem Relation Age of Onset  . Diabetes Mother   . Hyperthyroidism Mother   . Diabetes Father        type 2  . Heart disease Father   .  Cancer Father        prostate  . Eczema Sister   . Diabetes Maternal Grandmother        type 2    Social History Social History   Tobacco Use  . Smoking status: Never Smoker  . Smokeless tobacco: Never Used  Substance Use Topics  . Alcohol use: Yes    Comment: 1-2 glasses wine/weekly  . Drug use: No     Allergies   Other; Peanut-containing drug products; Corn-containing products; Soy allergy; Tobramycin; and Ketoconazole   Review of Systems Review of Systems  HENT: Positive for sore throat. Negative for trouble swallowing and voice change.   Respiratory: Negative for cough, shortness of breath and stridor.   Cardiovascular: Negative for chest pain.  Gastrointestinal: Positive for nausea. Negative for vomiting.   Skin: Negative for rash.  All other systems reviewed and are negative.    Physical Exam Updated Vital Signs BP 130/83 (BP Location: Left Arm)   Pulse (!) 109   Temp 97.6 F (36.4 C) (Oral)   Resp 19   Ht 5\' 2"  (1.575 m)   Wt 64.4 kg   LMP 12/28/2018   SpO2 100%   BMI 25.97 kg/m   Physical Exam Vitals signs and nursing note reviewed.  Constitutional:      Appearance: She is well-developed.  HENT:     Head: Normocephalic and atraumatic.     Right Ear: External ear normal.     Left Ear: External ear normal.     Nose: Nose normal.     Mouth/Throat:     Mouth: Mucous membranes are moist.     Pharynx: Oropharynx is clear. No oropharyngeal exudate or posterior oropharyngeal erythema.  Eyes:     General:        Right eye: No discharge.        Left eye: No discharge.  Cardiovascular:     Rate and Rhythm: Regular rhythm. Tachycardia present.     Heart sounds: Normal heart sounds.  Pulmonary:     Effort: Pulmonary effort is normal.     Breath sounds: Normal breath sounds. No stridor. No wheezing or rales.  Abdominal:     Palpations: Abdomen is soft.     Tenderness: There is no abdominal tenderness.  Skin:    General: Skin is warm and dry.  Neurological:     Mental Status: She is alert.  Psychiatric:        Mood and Affect: Mood is anxious.      ED Treatments / Results  Labs (all labs ordered are listed, but only abnormal results are displayed) Labs Reviewed  BASIC METABOLIC PANEL - Abnormal; Notable for the following components:      Result Value   Potassium 3.1 (*)    Glucose, Bld 134 (*)    All other components within normal limits  CBC WITH DIFFERENTIAL/PLATELET  I-STAT BETA HCG BLOOD, ED ([facility], [facility], [facility] ONLY)    EKG None  Radiology No results found.  Procedures Procedures (including critical care time)  Medications Ordered in ED Medications  ondansetron (ZOFRAN-ODT) disintegrating tablet 4 mg (has no administration in time range)   methylPREDNISolone sodium succinate (SOLU-MEDROL) 125 mg/2 mL injection 125 mg (125 mg Intravenous Given 01/08/19 1948)  sodium chloride 0.9 % bolus 1,000 mL (0 mLs Intravenous Stopped 01/08/19 2056)  ondansetron (ZOFRAN-ODT) disintegrating tablet 4 mg (4 mg Oral Given 01/08/19 1955)     Initial Impression / Assessment and Plan / ED Course  I  have reviewed the triage vital signs and the nursing notes.  Pertinent labs & imaging results that were available during my care of the patient were reviewed by me and considered in my medical decision making (see chart for details).     Patient presents for presumed allergic reaction.  She has already taken epinephrine as well as other meds as listed above.  Currently she feels anxious and tremulous but does not show any acute signs of anaphylactic reaction.  Unclear if she truly had anaphylaxis or allergic reaction/side effect of the food.  Either way, she has done well over the last couple hours and is improving.  She appears stable for discharge home and wants to leave now.  I will prescribe steroid burst in case this was allergic and recommend she follow-up closely with PCP.  Final Clinical Impressions(s) / ED Diagnoses   Final diagnoses:  Allergic reaction, initial encounter    ED Discharge Orders    None       Pricilla Loveless, MD 01/08/19 [phone]

## 2019-01-08 NOTE — Discharge Instructions (Signed)
If you develop trouble breathing, swallowing, lip or tongue swelling, feeling like your throat is swelling or closing, or any other new/concerning symptoms then return to the ER for evaluation.

## 2019-01-08 NOTE — ED Triage Notes (Signed)
Patient had home-made lasagna for dinner. Patient is allergic to tree nuts, peanuts, soy and corn. Patient gave her self her epipen 0.3 mg in her left thigh prior to arrival, 1 50 mg benadryl, 1 loratadine and 1 famotidine. Patient reports that her throat is itchy, but no swelling or closing feeling. No hives, some nausea. Patient presents with nervous appearance.

## 2019-01-08 NOTE — ED Notes (Signed)
Pt ambulated to BR with no assist, tolerated well.  

## 2019-01-08 NOTE — ED Notes (Signed)
As patient ambulated past me to use the RR she stated "I feel a lot better after that medication."

## 2019-01-09 ENCOUNTER — Encounter: Payer: Self-pay | Admitting: Allergy

## 2019-01-09 ENCOUNTER — Ambulatory Visit (INDEPENDENT_AMBULATORY_CARE_PROVIDER_SITE_OTHER): Payer: No Typology Code available for payment source | Admitting: Allergy

## 2019-01-09 ENCOUNTER — Telehealth: Payer: Self-pay

## 2019-01-09 VITALS — BP 132/90 | HR 97 | Resp 16 | Ht 62.0 in | Wt 141.0 lb

## 2019-01-09 DIAGNOSIS — T7800XD Anaphylactic reaction due to unspecified food, subsequent encounter: Secondary | ICD-10-CM | POA: Diagnosis not present

## 2019-01-09 DIAGNOSIS — T781XXD Other adverse food reactions, not elsewhere classified, subsequent encounter: Secondary | ICD-10-CM | POA: Diagnosis not present

## 2019-01-09 DIAGNOSIS — J3089 Other allergic rhinitis: Secondary | ICD-10-CM

## 2019-01-09 DIAGNOSIS — R0989 Other specified symptoms and signs involving the circulatory and respiratory systems: Secondary | ICD-10-CM

## 2019-01-09 MED ORDER — FLUTICASONE PROPIONATE 50 MCG/ACT NA SUSP: 1.0000 | NASAL | 5 refills | Status: DC | PRN

## 2019-01-09 MED ORDER — EPINEPHRINE 0.3 MG/0.3ML IJ SOAJ: 0.3000 mg | INTRAMUSCULAR | 2 refills | Status: DC | PRN

## 2019-01-09 NOTE — Telephone Encounter (Signed)
Please do referral for [facility]

## 2019-01-09 NOTE — Progress Notes (Addendum)
Follow-up Note  RE: Belicia Keys MRN: [id number] DOB: 1986/10/31 Date of Office Visit: 01/09/2019   History of present illness: [name] [name] is a 33 y.o. female presenting today for ED f/u for allergic reaction.  She was seen in the ED yesterday following allergic reaction.  She was last seen in the office on October 21, 2018 by Dr. Lucie Leather for regular follow-up.  She was eating homemade lasagna with ground beef (ingredients wheat and eggs), tomato pasta sauce, lime aid drink and about 5 minutes after dinner felt a lump-like sensation in her throat.  No oral swelling.  No trouble breathing or swallowing.  Throat felt itchy.  Possible itch with hives (she states with other reactions she believes she may have had hives as well).  She took benadryl, pepcid and claritin with not much relief.  She then administered epipen and went to ED.  She felt some relief but also felt a bit nauseated but did not have any emesis. All of these foods she states she has had before without issue.  She does states she likely has a URI currently as she has been having more nasal congestion and drainage.     In the ED she had normal vitals.  Exam was unrevealing.  She was given zofran, solumedrol, IVF.   She was stable and able to d/c and was provided with steroid burst.    Today she states she has a HA and is having a nosebleed.  She is very nervous and anxious surrounding eating.   She feels that most meals she feels a globus sensation and does report taking famotidine and an antihistamine (either claritin or zyrtec) twice a day at meal times.    Review of systems: Review of Systems  Constitutional: Negative for chills, fever and malaise/fatigue.  HENT: Positive for congestion, nosebleeds and sore throat. Negative for ear discharge, ear pain and sinus pain.   Eyes: Negative for pain, discharge and redness.  Respiratory: Negative for cough, shortness of breath and wheezing.   Cardiovascular: Negative for chest  pain.  Gastrointestinal: Positive for nausea. Negative for abdominal pain, constipation, diarrhea and vomiting.  Musculoskeletal: Negative for joint pain.  Skin: Positive for itching and rash.  Neurological: Positive for headaches. Negative for loss of consciousness.    All other systems negative unless noted above in HPI  Past medical/social/surgical/family history have been reviewed and are unchanged unless specifically indicated below.  No changes  Medication List: Allergies as of 01/09/2019      Reactions   Other    Tree nuts    Peanut-containing Drug Products Anaphylaxis   Corn-containing Products    Soy Allergy    Tobramycin Other (See Comments)   Caused dizziness   Ketoconazole Rash      Medication List       Accurate as of January 09, 2019  4:37 PM. Always use your most recent med list.        amitriptyline 10 MG tablet Commonly known as:  ELAVIL Take 1 tablet (10 mg total) by mouth at bedtime.   cyclobenzaprine 5 MG tablet Commonly known as:  FLEXERIL Take 5 mg by mouth 3 (three) times daily as needed for muscle spasms.   diphenhydrAMINE 12.5 MG/5ML liquid Commonly known as:  BENADRYL Take 20 mg by mouth daily as needed for allergies.   EPINEPHrine 0.3 mg/0.3 mL Soaj injection Commonly known as:  EPI-PEN Inject 0.3 mLs (0.3 mg total) into the muscle as needed for anaphylaxis.  famotidine 40 MG tablet Commonly known as:  PEPCID Take 1 tablet (40 mg total) by mouth 2 (two) times daily.   fluticasone 110 MCG/ACT inhaler Commonly known as:  FLOVENT HFA Inhale 1 puff into the lungs 2 (two) times daily.   fluticasone 50 MCG/ACT nasal spray Commonly known as:  FLONASE Place 1 spray into both nostrils as needed for allergies or rhinitis.   loratadine 10 MG tablet Commonly known as:  CLARITIN Take 1 tablet (10 mg total) by mouth daily.   meclizine 25 MG tablet Commonly known as:  ANTIVERT Take 1 tablet (25 mg total) by mouth 3 (three) times daily as  needed for dizziness.   meloxicam 15 MG tablet Commonly known as:  MOBIC Take 15 mg by mouth daily.   montelukast 5 MG chewable tablet Commonly known as:  SINGULAIR Chew 1 tablet (5 mg total) by mouth at bedtime.   naproxen 500 MG tablet Commonly known as:  NAPROSYN Take 500 mg by mouth 2 (two) times daily as needed for moderate pain.   ondansetron 4 MG disintegrating tablet Commonly known as:  ZOFRAN-ODT Take 4 mg by mouth every 8 (eight) hours as needed for nausea or vomiting (headaches).   predniSONE 20 MG tablet Commonly known as:  DELTASONE 2 tabs po daily x 4 days   PROAIR HFA 108 (90 Base) MCG/ACT inhaler Generic drug:  albuterol Inhale 2 puffs into the lungs every 6 (six) hours as needed for wheezing or shortness of breath.       Known medication allergies: Allergies  Allergen Reactions  . Other     Tree nuts   . Peanut-Containing Drug Products Anaphylaxis  . Corn-Containing Products   . Soy Allergy   . Tobramycin Other (See Comments)    Caused dizziness  . Ketoconazole Rash     Physical examination: Blood pressure 132/90, pulse 97, resp. rate 16, height 5\' 2"  (1.575 m), weight 141 lb (64 kg), last menstrual period 12/28/2018, SpO2 96 %.  General: Alert, interactive, in no acute distress. HEENT: PERRLA, TMs pearly gray, turbinates mildly edematous bloody drainage from left nostril, post-pharynx non erythematous. Neck: Supple without lymphadenopathy. Lungs: Clear to auscultation without wheezing, rhonchi or rales. {no increased work of breathing. CV: Normal S1, S2 without murmurs. Abdomen: Nondistended, nontender. Skin: Warm and dry, without lesions or rashes. Extremities:  No clubbing, cyanosis or edema. Neuro:   Grossly intact.  Diagnositics/Labs: Labs:  Component     Latest Ref Rng & Units 01/08/2019  WBC     4.0 - 10.5 K/uL 7.5  RBC     3.87 - 5.11 MIL/uL 4.38  Hemoglobin     12.0 - 15.0 g/dL 16.6  HCT     06.3 - 01.6 % 42.1  MCV     80.0  - 100.0 fL 96.1  MCH     26.0 - 34.0 pg 30.4  MCHC     30.0 - 36.0 g/dL 01.0  RDW     93.2 - 35.5 % 12.8  Platelets     150 - 400 K/uL 268  nRBC     0.0 - 0.2 % 0.0  Neutrophils     % 47  NEUT#     1.7 - 7.7 K/uL 3.5  Lymphocytes     % 44  Lymphocyte #     0.7 - 4.0 K/uL 3.3  Monocytes Relative     % 7  Monocyte #     0.1 - 1.0 K/uL 0.5  Eosinophil     %  1  Eosinophils Absolute     0.0 - 0.5 K/uL 0.1  Basophil     % 1  Basophils Absolute     0.0 - 0.1 K/uL 0.0  Immature Granulocytes     % 0  Abs Immature Granulocytes     0.00 - 0.07 K/uL 0.02  Sodium     135 - 145 mmol/L 136  Potassium     3.5 - 5.1 mmol/L 3.1 (L)  Chloride     98 - 111 mmol/L 102  CO2     22 - 32 mmol/L 26  Glucose     70 - 99 mg/dL 657 (H)  BUN     6 - 20 mg/dL 18  Creatinine     8.46 - 1.00 mg/dL 9.62  Calcium     8.9 - 10.3 mg/dL 9.1  GFR, Est Non African American     >60 mL/min >60  GFR, Est African American     >60 mL/min >60  Anion gap     5 - 15 8  I-stat hCG, quantitative     <5 mIU/mL <5.0  Comment 3          Assessment and plan:   Anaphylaxis- she has had several episodes of anaphylaxis that was deemed to be related to foods as she does have food allergy.  We will repeat serum IgE testing at this time and will add alpha gal to the labs.  She has access to her epinephrine device and knows of signs and symptoms to use her epinephrine.  She believes she has had some itching and hives with her anaphylaxis and I believe she may benefit from Xolair monthly injections.   Allergic rhinitis -she will continue her current regimen as below Pollen food allergy syndrome-she will continue her current food avoidance and believe she will benefit from allergen immunotherapy as well as Xolair injections.  1.  Continue to perform allergen avoidance measures as best as possible.  Continue to keep food journal.   Will obtain serum IgE levels to follow trend and see if rising IgE.      2.  Will start approval process for Xolair to help with itch and control of reactions  2.  Auvi-Q 0.3 / EpiPen, Benadryl, M.D./ER evaluation for allergic reaction  3.  Every day utilize the following medications:   A.  Flonase 1-2 sprays each nostril daily  B.  Flovent 110 -2 inhalations daily  C.  Montelukast 10 mg -1 tablet daily  D.  Cetirizine/Zyrtec 10 mg -1 tablet at PM  E.  Loratadine/Claritin 10 mg -2 tablets at AM  F.  Famotidine 40 mg -1 tablet at AM and PM  4. Can continue to use albuterol MDI 2 inhalations before exercise or as needed  5. Will plan to start course of immunotherapy after initiation of Xolair  6. Referral has been made to ENT for rhinoscopy to eval for globus sensation with eating.  I am not convinced that the lump sensation she had recently was truly an allergic reaction.  Consider she feels a lump in her throat with every meal would like anatomy to be evaluated.    7. Return to clinic in 3-4 months or sooner if needed  I appreciate the opportunity to take part in Lugenia's care. Please do not hesitate to contact me with questions.  Sincerely,   Margo Aye, MD Allergy/Immunology [facility]

## 2019-01-09 NOTE — Patient Instructions (Addendum)
  1.  Continue to perform allergen avoidance measures as best as possible.  Continue to keep food journal.   Will obtain serum IgE levels to follow trend and see if rising IgE.      2. Will start approval process for Xolair to help with itch and control of reactions  2.  Auvi-Q 0.3 / EpiPen, Benadryl, M.D./ER evaluation for allergic reaction  3.  Every day utilize the following medications:   A.  Flonase 1-2 sprays each nostril daily  B.  Flovent 110 -2 inhalations daily  C.  Montelukast 10 mg -1 tablet daily  D.  Cetirizine/Zyrtec 10 mg -1 tablet at PM  E.  Loratadine/Claritin 10 mg -2 tablets at AM  F.  Famotidine 40 mg -1 tablet at AM and PM  4. Can continue to use albuterol MDI 2 inhalations before exercise or as needed  5. Will plan to start course of immunotherapy after initiation of Xolair  6. Return to clinic in 3-4 months or sooner if needed

## 2019-01-12 ENCOUNTER — Ambulatory Visit: Payer: No Typology Code available for payment source | Admitting: Allergy and Immunology

## 2019-01-12 ENCOUNTER — Ambulatory Visit (INDEPENDENT_AMBULATORY_CARE_PROVIDER_SITE_OTHER): Payer: No Typology Code available for payment source | Admitting: Allergy

## 2019-01-12 ENCOUNTER — Encounter: Payer: Self-pay | Admitting: Allergy

## 2019-01-12 ENCOUNTER — Telehealth: Payer: Self-pay

## 2019-01-12 ENCOUNTER — Other Ambulatory Visit: Payer: Self-pay | Admitting: Physician Assistant

## 2019-01-12 VITALS — BP 118/84 | HR 92 | Ht 62.0 in

## 2019-01-12 DIAGNOSIS — T7840XD Allergy, unspecified, subsequent encounter: Secondary | ICD-10-CM

## 2019-01-12 DIAGNOSIS — T781XXD Other adverse food reactions, not elsewhere classified, subsequent encounter: Secondary | ICD-10-CM

## 2019-01-12 DIAGNOSIS — T781XXA Other adverse food reactions, not elsewhere classified, initial encounter: Secondary | ICD-10-CM | POA: Insufficient documentation

## 2019-01-12 DIAGNOSIS — J4520 Mild intermittent asthma, uncomplicated: Secondary | ICD-10-CM | POA: Diagnosis not present

## 2019-01-12 DIAGNOSIS — J3089 Other allergic rhinitis: Secondary | ICD-10-CM | POA: Diagnosis not present

## 2019-01-12 DIAGNOSIS — T7840XA Allergy, unspecified, initial encounter: Secondary | ICD-10-CM | POA: Insufficient documentation

## 2019-01-12 NOTE — Progress Notes (Signed)
Opened in error

## 2019-01-12 NOTE — Progress Notes (Signed)
Follow Up Note  RE: [name] [name] MRN: [id number] DOB: [birth year] Date of Office Visit: 01/12/2019  Referring provider: Ileana [name], [name] P, MD Primary care provider: Ileana [name], [name] P, MD  Chief Complaint: Allergic Reaction (possible to benadryl syrup..... )  History of Present Illness: I had the pleasure of seeing [name] [name] for a follow up visit at the [facility] on 01/13/2019. She is a 33 y.o. female, who is being followed for adverse food reaction, allergic rhinitis, asthma. Today she is here for new complaint of recent reaction and concerned about benadryl allergy. Her previous allergy office visit was on 01/09/2019 with [name] [name].   Patient has been having increased sensitivities to foods since moving to [place] a few years ago. She does have oral allergy syndrome to fresh fruits but she also has reactions to other foods as well.  Most recently on 01/08/2019 patient went to the ER after she ate homemade lasagna with beef at home. Within 5 minutes of ingestion she felt flushed, throat fullness, throat itching and inability to swallow. She also had lemonade to drink and took a lactaid pill prior due to her lactose intolerance. Denies any associated cofactors such as exertion, infection, NSAID or alcohol use. She took benadryl 12.5mg /355ml 1 teaspoon at home which did not help so self-injected with EpiPen with some benefit and went to the ER.  In the ER she was treated with IV methylprednisolone and Zofran with good benefit. Symptoms lasted for a few hours.   On 01/11/2019 patient was at work and had some saltine crackers for a snack. She has eaten these saltine crackers before but sometimes they would cause throat discomfort for which she normally takes zyrtec. This started happening about 1 month ago. This time she did not have zyrtec with her so she took benadryl 1 tsp. Symptoms did not improve and took another 1 tsp after 10 minutes and another 1 tsp after another 10 minutes. [facility] [place] [facility] [place] with IV benadryl, IV solumedrol and Pepcid. Symptoms improved enough for her to be able to drive home.   She has been taking Claritin 20mg  BID, Pepcid 20mg  and Singulair 10mg  daily. These medications do not seem to give her problems.   Now she is concerned whether she had a reaction to the liquid benadryl which contained corn syrup as she is allergic to corn.  She also tried to take [employer] brand cetirizine and felt chest tightness and throat itching. Claritin does not seem to work as well as zyrtec.  She is hesitant about Xolair injections due to what she read online   Past work up includes: 2020 immunocap was positive to hazelnut, almond and peanut. Corn and soy showed borderline positivity. Some other bloodwork is pending. 2019 skin testing was positive to corn and peanut.  Dietary History: patient has been eating other foods including lactose free milk, eggs, shellfish, seafood, wheat, meats, and vegetables.  Questioning sesame allergy.   She reports reading labels and avoiding peanuts, tree nuts, soy and corn in diet completely.  Saltine crackers ingredients:   [employer] brand children's benadryl.   Zyrtec:   Assessment and Plan: [name] [name] is a 33 y.o. female with: Allergic reaction 2 reactions within 1 week requiring ER visit. Her symptoms include flushing, throat fullness, throat itching and inability to swallow. Patient now concerned about reacting to the benadryl as she noticed it contains corn syrup and she has allergies to corn. She is hesitant  in starting Xolair and just wants to continue her current antihistamine regimen and use benadryl liquid as a back up for reactions but would like to get it from a compounding pharmacy to make sure there is no corn syrup.   Discussed with patient that benadryl allergy is rare and do agree with that she should avoid the ones that contain corn syrup. Recommend that she uses a dye free, flavor free and corn  syrup free benadryl as needed. However also discussed that given the timeline of her events her reactions could have been just from the initial exposure and not from the benadryl necessarily.  Will perform in office benadryl challenge to the compounded benadryl.   Patient would be a good candidate for Xolair therapy but patient declines at this time. Discussed with her that steroid injections and oral prednisone have their own set of side effects and it's not a maintenance therapy for her situation. It is okay to use sparingly for acute reactions as needed.    Patient does have epinephrine on hand if needed for anaphylactic reactions.  For mild symptoms you can take over the counter antihistamines such as Benadryl and monitor symptoms closely. If symptoms worsen or if you have severe symptoms including breathing issues, throat closure, significant swelling, whole body hives, severe diarrhea and vomiting, lightheadedness then inject epinephrine and seek immediate medical care afterwards.  Continue to perform allergen avoidance measures as best as possible.  Continue to keep food journal and avoid the foods that bother her.  Advised her to get tryptase and C4 level within 2-3 hours of having a reaction.   Patient still needs to follow up with ENT. If that work up is unremarkable may benefit from EGD/swallow evaluation in the future given her globus throat sensation.   Continue the following medications:               A.  Flonase 1-2 sprays each nostril daily               B.  Montelukast 10 mg -1 tablet daily               C. Try zyrtec liquid 10mg  twice a day               D.  Loratadine/Claritin 10 mg -2 tablets at AM               E.  Famotidine 40 mg -1 tablet at AM and PM  Other allergic rhinitis Past history - 2018 skin testing was positive to grass, tree, weed, mold, cat. Interim history - 2020 Immunocal was positive to dust mites, cat, dog, mold, tree pollen and borderline to  grass.  Continue environmental control measures.  Continue taking antihistamines and Singulair 10mg  daily as above.  Adverse food reaction Past history - 2018 skin testing was positive to soy, peanut, hazelnut, [place] nut, cottonseed, peas, corn. 2018 blood work was positive to peanuts, hazelnut, almond.  Borderline reactivity to soy. Patient failed oral food challenge in 2019 to corn.  She complained of feeling flushed felt and abdominal pain. Interim history -2020 Immunocal was positive to peanuts, hazelnut, almond.  Borderline positive to soy and corn.  Other tree nuts, pea, and wheat were negative. Alpha gal panel pending. Currently avoiding peanuts, tree nuts, soy and corn.  Continue avoidance.   Has Epipen on hand if needed.   Pollen-food allergy Continue to avoid fresh fruits and fresh vegetables that cause perioral pruritus.   Asthma,  well controlled Well-controlled.  Continue Flovent 110 2 puffs daily.  May use albuterol rescue inhaler 2 puffs or nebulizer every 4 to 6 hours as needed for shortness of breath, chest tightness, coughing, and wheezing. May use albuterol rescue inhaler 2 puffs 5 to 15 minutes prior to strenuous physical activities. Monitor frequency of use.   Return in about 4 weeks (around 02/09/2019).  Lab Orders     C4 complement     Tryptase  Diagnostics: None.  Medication List:  Current Outpatient Medications  Medication Sig Dispense Refill  . albuterol (PROAIR HFA) 108 (90 Base) MCG/ACT inhaler Inhale 2 puffs into the lungs every 6 (six) hours as needed for wheezing or shortness of breath.    Marland Kitchen. amitriptyline (ELAVIL) 10 MG tablet Take 1 tablet (10 mg total) by mouth at bedtime. 30 tablet 1  . cyclobenzaprine (FLEXERIL) 5 MG tablet Take 5 mg by mouth 3 (three) times daily as needed for muscle spasms.     . diphenhydrAMINE (BENADRYL) 12.5 MG/5ML liquid Take 20 mg by mouth daily as needed for allergies.    Marland Kitchen. EPINEPHrine 0.3 mg/0.3 mL IJ SOAJ injection  Inject 0.3 mLs (0.3 mg total) into the muscle as needed for anaphylaxis. 1 Device 2  . famotidine (PEPCID) 40 MG tablet Take 1 tablet (40 mg total) by mouth 2 (two) times daily. 60 tablet 5  . fluticasone (FLONASE) 50 MCG/ACT nasal spray Place 1 spray into both nostrils as needed for allergies or rhinitis. 16 g 5  . fluticasone (FLOVENT HFA) 110 MCG/ACT inhaler Inhale 1 puff into the lungs 2 (two) times daily.    Marland Kitchen. loratadine (CLARITIN) 10 MG tablet Take 1 tablet (10 mg total) by mouth daily. (Patient taking differently: Take 10 mg by mouth 2 (two) times daily. ) 30 tablet 5  . meclizine (ANTIVERT) 25 MG tablet Take 1 tablet (25 mg total) by mouth 3 (three) times daily as needed for dizziness. 30 tablet 0  . meloxicam (MOBIC) 15 MG tablet Take 15 mg by mouth daily.     . montelukast (SINGULAIR) 5 MG chewable tablet Chew 1 tablet (5 mg total) by mouth at bedtime. 30 tablet 5  . naproxen (NAPROSYN) 500 MG tablet Take 500 mg by mouth 2 (two) times daily as needed for moderate pain.     Marland Kitchen. ondansetron (ZOFRAN-ODT) 4 MG disintegrating tablet Take 4 mg by mouth every 8 (eight) hours as needed for nausea or vomiting (headaches).     . predniSONE (DELTASONE) 20 MG tablet 2 tabs po daily x 4 days 8 tablet 0  . predniSONE (DELTASONE) 10 MG tablet Take 20mg  2 times daily for 5 days, 10mg  daily for 5 days, then stop. 25 tablet 0   No current facility-administered medications for this visit.    Allergies: Allergies  Allergen Reactions  . Other     Tree nuts   . Peanut-Containing Drug Products Anaphylaxis  . Corn-Containing Products   . Soy Allergy   . Tobramycin Other (See Comments)    Caused dizziness  . Ketoconazole Rash   I reviewed her past medical history, social history, family history, and environmental history and no significant changes have been reported from previous visit on 01/09/2019.  Review of Systems  Constitutional: Negative for appetite change, chills, fever and unexpected weight  change.  HENT: Negative for congestion and rhinorrhea.   Eyes: Negative for itching.  Respiratory: Negative for cough, chest tightness, shortness of breath and wheezing.   Gastrointestinal: Negative for abdominal pain.  Skin: Negative for rash.  Allergic/Immunologic: Positive for food allergies.  Neurological: Negative for headaches.   Objective: BP 118/84   Pulse 92   Ht 5\' 2"  (1.575 m)   LMP 12/28/2018   SpO2 97%   BMI 25.79 kg/m  Body mass index is 25.79 kg/m. Physical Exam  Constitutional: She is oriented to person, place, and time. She appears well-developed and well-nourished.  HENT:  Head: Normocephalic and atraumatic.  Right Ear: External ear normal.  Left Ear: External ear normal.  Nose: Nose normal.  Mouth/Throat: Oropharynx is clear and moist.  Eyes: Conjunctivae and EOM are normal.  Neck: Neck supple.  Cardiovascular: Normal rate, regular rhythm and normal heart sounds. Exam reveals no gallop and no friction rub.  No murmur heard. Pulmonary/Chest: Effort normal and breath sounds normal. She has no wheezes. She has no rales.  Lymphadenopathy:    She has no cervical adenopathy.  Neurological: She is alert and oriented to person, place, and time.  Skin: Skin is warm. No rash noted.  Psychiatric: She has a normal mood and affect. Her behavior is normal.  Nursing note and vitals reviewed.  Previous notes and tests were reviewed. The plan was reviewed with the patient/family, and all questions/concerned were addressed.  It was my pleasure to see Lasharon today and participate in her care. Please feel free to contact me with any questions or concerns.  Sincerely,  Wyline Mood, DO Allergy & Immunology  [facility] office: [phone] [facility] office: [phone]  40 minutes spent face-to-face with more than 50% of the time spent discussing allergic reactions.

## 2019-01-12 NOTE — Telephone Encounter (Signed)
-----   Message from [facility] Larose HiresPatricia Padgett, MD sent at 01/09/2019  5:26 PM EST ----- [facility]; evaluate with rhinoscopy

## 2019-01-12 NOTE — Assessment & Plan Note (Addendum)
Past history - 2018 skin testing was positive to soy, peanut, hazelnut, [place] nut, cottonseed, peas, corn. 2018 blood work was positive to peanuts, hazelnut, almond.  Borderline reactivity to soy. Patient failed oral food challenge in 2019 to corn.  She complained of feeling flushed felt and abdominal pain. Interim history -2020 Immunocal was positive to peanuts, hazelnut, almond.  Borderline positive to soy and corn.  Other tree nuts, pea, and wheat were negative. Alpha gal panel pending. Currently avoiding peanuts, tree nuts, soy and corn.  Continue avoidance.   Has Epipen on hand if needed.

## 2019-01-12 NOTE — Telephone Encounter (Signed)
Referral has been faxed to their office. Patient is also welcome to call and has been informed.

## 2019-01-12 NOTE — Patient Instructions (Addendum)
1.  Continue to perform allergen avoidance measures as best as possible.  Continue to keep food journal.    2.  Auvi-Q 0.3 / EpiPen, Benadryl, M.D./ER evaluation for allergic reaction  3.  Every day utilize the following medications:                A.  Flonase 1-2 sprays each nostril daily               B.  Flovent 110 -2 inhalations daily               C.  Montelukast 10 mg -1 tablet daily               D. Try zyrtec liquid 10mg  twice a day               E.  Loratadine/Claritin 10 mg -2 tablets at AM               F.  Famotidine 40 mg -1 tablet at AM and PM  4. Can continue to use albuterol MDI 2 inhalations before exercise or as needed  5. When you are having a reaction please get bloodwork within 2-3 hours. - C4, Tryptase   6. Keep track of reactions.  7. Follow up with ENT. 8. Will let you know about the compounded benadryl.  Follow up in 4 weeks.

## 2019-01-12 NOTE — Assessment & Plan Note (Addendum)
2 reactions within 1 week requiring ER visit. Her symptoms include flushing, throat fullness, throat itching and inability to swallow. Patient now concerned about reacting to the benadryl as she noticed it contains corn syrup and she has allergies to corn. She is hesitant in starting Xolair and just wants to continue her current antihistamine regimen and use benadryl liquid as a back up for reactions but would like to get it from a compounding pharmacy to make sure there is no corn syrup.   Discussed with patient that benadryl allergy is rare and do agree with that she should avoid the ones that contain corn syrup. Recommend that she uses a dye free, flavor free and corn syrup free benadryl as needed. However also discussed that given the timeline of her events her reactions could have been just from the initial exposure and not from the benadryl necessarily.  Will perform in office benadryl challenge to the compounded benadryl.   Patient would be a good candidate for Xolair therapy but patient declines at this time. Discussed with her that steroid injections and oral prednisone have their own set of side effects and it's not a maintenance therapy for her situation. It is okay to use sparingly for acute reactions as needed.    Patient does have epinephrine on hand if needed for anaphylactic reactions.  For mild symptoms you can take over the counter antihistamines such as Benadryl and monitor symptoms closely. If symptoms worsen or if you have severe symptoms including breathing issues, throat closure, significant swelling, whole body hives, severe diarrhea and vomiting, lightheadedness then inject epinephrine and seek immediate medical care afterwards.  Continue to perform allergen avoidance measures as best as possible.  Continue to keep food journal and avoid the foods that bother her.  Advised her to get tryptase and C4 level within 2-3 hours of having a reaction.   Patient still needs to follow  up with ENT. If that work up is unremarkable may benefit from EGD/swallow evaluation in the future given her globus throat sensation.   Continue the following medications:               A.  Flonase 1-2 sprays each nostril daily               B.  Montelukast 10 mg -1 tablet daily               C. Try zyrtec liquid 10mg  twice a day               D.  Loratadine/Claritin 10 mg -2 tablets at AM               E.  Famotidine 40 mg -1 tablet at AM and PM

## 2019-01-12 NOTE — Assessment & Plan Note (Addendum)
Past history - 2018 skin testing was positive to grass, tree, weed, mold, cat. Interim history - 2020 Immunocal was positive to dust mites, cat, dog, mold, tree pollen and borderline to grass.  Continue environmental control measures.  Continue taking antihistamines and Singulair 10mg  daily as above.

## 2019-01-13 ENCOUNTER — Telehealth: Payer: Self-pay | Admitting: *Deleted

## 2019-01-13 ENCOUNTER — Other Ambulatory Visit: Payer: Self-pay | Admitting: Allergy

## 2019-01-13 ENCOUNTER — Encounter: Payer: Self-pay | Admitting: Allergy

## 2019-01-13 ENCOUNTER — Ambulatory Visit (INDEPENDENT_AMBULATORY_CARE_PROVIDER_SITE_OTHER): Payer: No Typology Code available for payment source | Admitting: Allergy & Immunology

## 2019-01-13 ENCOUNTER — Encounter: Payer: Self-pay | Admitting: Allergy & Immunology

## 2019-01-13 DIAGNOSIS — R0989 Other specified symptoms and signs involving the circulatory and respiratory systems: Secondary | ICD-10-CM

## 2019-01-13 DIAGNOSIS — K219 Gastro-esophageal reflux disease without esophagitis: Secondary | ICD-10-CM

## 2019-01-13 DIAGNOSIS — T7840XD Allergy, unspecified, subsequent encounter: Secondary | ICD-10-CM

## 2019-01-13 DIAGNOSIS — T781XXD Other adverse food reactions, not elsewhere classified, subsequent encounter: Secondary | ICD-10-CM

## 2019-01-13 DIAGNOSIS — J3089 Other allergic rhinitis: Secondary | ICD-10-CM

## 2019-01-13 DIAGNOSIS — J45909 Unspecified asthma, uncomplicated: Secondary | ICD-10-CM | POA: Insufficient documentation

## 2019-01-13 MED ORDER — DIPHENHYDRAMINE HCL 50 MG/ML IJ SOLN
25.0000 mg | Freq: Once | INTRAMUSCULAR | Status: AC
  Administered 2019-01-13: 25 mg via INTRAMUSCULAR

## 2019-01-13 MED ORDER — PREDNISONE 10 MG PO TABS: ORAL_TABLET | ORAL | 0 refills | Status: DC

## 2019-01-13 NOTE — Patient Instructions (Signed)
Allergic reaction  - Continue all of the daily medications below:                A.  Flonase 1-2 sprays each nostril daily               B.  Flovent 110 -2 inhalations daily               C.  Montelukast 10 mg -1 tablet daily               D. Try zyrtec liquid 10mg  twice a day               E.  Loratadine/Claritin 10 mg -2 tablets at AM               F.  Famotidine 40 mg -1 tablet at AM and PM - Add on Dexilant 30mg  once daily (samples provided).  - Continue prednisone: 20mg  twice daily x 5 days then 10mg  twice daily x 5 days, then STOP. - We are calling to see if we can get the ENT appointment made in the next day or so.  - Continue to keep track of reactions and note any triggers.  - Consider starting Xolair.   Follow up in 4 weeks, as scheduled.

## 2019-01-13 NOTE — Telephone Encounter (Signed)
Did she make this appointment today after her same day visit?  Is this a check-in after today's visit?  I don't know that she has to been seen so soon after these recent visits and thus have no additional changes to her medication plan at this time.  Still awaiting additional labs from Dr. [name] visit.   If interested we can offer a Xolair sample to get her started if she would like to do that.

## 2019-01-13 NOTE — Telephone Encounter (Signed)
Ok please keep Dr. Selena Batten in the loop as I think she was the one who recommended this specialized benadryl.  When I last saw her there was not a concern or complaint regarding benadryl so this likely was discussed yesterday the reasoning for customized benadryl.

## 2019-01-13 NOTE — Telephone Encounter (Signed)
Dr Padgett please advise 

## 2019-01-13 NOTE — Telephone Encounter (Signed)
Dr. Delorse Lek, I spoke with [name] [name] and he informed me that the patient is the one that recommended this compound pharmacy.

## 2019-01-13 NOTE — Telephone Encounter (Signed)
I only ordered C4 and tryptase to be drawn during an acute attack. No labs were drawn on 2/3 visit. Some were still spending from earlier visit with Dr. Delorse Lek. She does not want to do Xolair at this time.  The benadryl capsule I'm okay with. She wanted to do a challenge with them in the office to make sure she doesn't have a reaction to them. See if she still would like to do that.

## 2019-01-13 NOTE — Progress Notes (Signed)
FOLLOW UP  Date of Service/Encounter:  01/13/19   Assessment:   Allergic reaction versus panic attacks  Other allergic rhinitis  Gastroesophageal reflux disease  Globus sensation  Adverse food reaction (multiple foods) - with multiple positive findings on blood tests   [name] walked into the clinic with throat swelling.  On exam, there does not seem to be anything wrong with her.  I am able to look into her posterior oropharynx without any problems.  She is not wheezing on exam and her vitals remained normal.  We did administer Benadryl 25 mg.  She did seem to become very anxious when we started to drop epinephrine, which we are doing as a precaution.  We then offered to give her Benadryl, but she preferred to do only 25 mg and preferred an injection instead of any liquid or tablet formulations.  She seems to be perseverating on a positive corn finding on her blood test, and is refusing to use any kind of medication which might contain corn syrup.  This is rather odd since corn syrup does not contain any corn protein, but regardless we went ahead and did IM Benadryl.  I think this has more to do with anxiety at this point, and I think she might be coming to this conclusion as well.  We do need to refer to otolaryngology for an emergent scoping, if only to give her some reassurance that there is no mass in her throat.    Plan/Recommendations:   Allergic reaction  - Continue all of the daily medications below:                A.  Flonase 1-2 sprays each nostril daily               B.  Flovent 110 -2 inhalations daily               C.  Montelukast 10 mg -1 tablet daily               D. Try zyrtec liquid 10mg  twice a day               E.  Loratadine/Claritin 10 mg -2 tablets at AM               F.  Famotidine 40 mg -1 tablet at AM and PM - Add on Dexilant 30mg  once daily (samples provided).  - Continue prednisone: 20mg  twice daily x 5 days then 10mg  twice daily x 5 days, then  STOP. - We are calling to see if we can get the ENT appointment made in the next day or so.  - Continue to keep track of reactions and note any triggers.  - Consider starting Xolair.   Follow up in 4 weeks, as scheduled.    Total of 60 minutes, greater than 50% of which was spent in discussion of treatment and management options.      Subjective:   [name] [name] is a 33 y.o. female presenting today for follow up of No chief complaint on file.   [name] [name] has a history of the following: Patient Active Problem List   Diagnosis Date Noted  . Asthma, well controlled 01/13/2019  . Allergic reaction 01/12/2019  . Adverse food reaction 01/12/2019  . Pollen-food allergy 01/12/2019  . Other allergic rhinitis 01/12/2019  . Seasonal allergies 05/14/2018    History obtained from: chart review and patient.  [name] [name] Primary Care Provider is [name] [name].  [name] [name] is a 33 y.o. female presenting for a sick visit.  The patient is well-known to our practice and was most recently seen yesterday following which he felt to be a prolonged anaphylactic reaction from last week.  She was commenced that it had to do with Benadryl syrup.  Her recent history is reviewed extensively in her office visit from yesterday.  Briefly, she ate lasagna on January 30 and then went to the ER since she felt flushing within 5 minutes as well as throat fullness and itching.  In the ER, she was treated with methylprednisolone as well as Zofran good results.  Then a couple days later, she ate saltine crackers which caused some throat discomfort.  She took Benadryl 3 times over the course of 30 minutes.  She went to an outside ED and was treated with IV Benadryl, IV Solu-Medrol, and Pepcid.  Symptoms improved to the point where she could drive home.  She has been taking Claritin 20 mg twice daily as well as Pepcid 20 mg and Singulair.  Yesterday, she was concerned that she was reacting to liquid Benadryl since  it contain corn syrup.  She has had an extensive lab work-up which is showed only minimally positive IgE's to a multitude of foods.  Tryptase and C4 were sent yesterday.  An alpha gal panel is still pending from last week.  Since the last visit, she continues to feel that her throat is tightening.  She came in and requested a visit today.  She has had no new exposures.  She remains on all of her medications, including prednisone.  She tells me that prednisone is the only thing that has saved her life.  She has not called ENT to make an appointment.  Otherwise, there have been no changes to her past medical history, surgical history, family history, or social history.    Review of Systems: a 14-point review of systems is pertinent for what is mentioned in HPI.  Otherwise, all other systems were negative.  Constitutional: negative other than that listed in the HPI Eyes: negative other than that listed in the HPI Ears, nose, mouth, throat, and face: negative other than that listed in the HPI Respiratory: negative other than that listed in the HPI Cardiovascular: negative other than that listed in the HPI Gastrointestinal: negative other than that listed in the HPI Genitourinary: negative other than that listed in the HPI Integument: negative other than that listed in the HPI Hematologic: negative other than that listed in the HPI Musculoskeletal: negative other than that listed in the HPI Neurological: negative other than that listed in the HPI Allergy/Immunologic: negative other than that listed in the HPI    Objective:   Last menstrual period 12/28/2018. There is no height or weight on file to calculate BMI.   Physical Exam:  General: Alert, interactive, in mild to moderate distress. Eyes: No conjunctival injection bilaterally, no discharge on the right, no discharge on the left and no Horner-Trantas dots present. PERRL bilaterally. EOMI without pain. No photophobia.  Ears: Right TM  pearly gray with normal light reflex, Left TM pearly gray with normal light reflex, Right TM intact without perforation and Left TM intact without perforation.  Nose/Throat: External nose within normal limits and septum midline. Turbinates edematous and pale with clear discharge. Posterior oropharynx erythematous with cobblestoning in the posterior oropharynx. Her epiglottis is clearly visualized and there is no swelling at all appreciated. Tonsils 2+ without exudates.  Tongue without thrush. Lungs: Clear to  auscultation without wheezing, rhonchi or rales. No increased work of breathing. CV: Normal S1/S2. No murmurs. Capillary refill <2 seconds.  Skin: Warm and dry, without lesions or rashes. Neuro:   Grossly intact. No focal deficits appreciated. Responsive to questions.  Diagnostic studies: none   We did give her IM Benadryl per her request.  We monitored her with vitals every 10 to 15 minutes for 40 minutes.  All vitals remained stable.  We talked to Tobey Bride, one of our front desk staff, who talked to [facility] and will schedule her for a more emergent evaluation.    Malachi Bonds, [name]  [facility]

## 2019-01-13 NOTE — Telephone Encounter (Signed)
Re-faxed the referral over to [facility] as STAT Stating that patient has worsening throat tightening and concern for airway collapse - per Dr gallagher  They should be reaching out to the patient to schedule

## 2019-01-13 NOTE — Telephone Encounter (Signed)
I don't know anything about compounded benadryl.  I'm not sure what compounded benadryl is.    Did Dr. Selena Batten recommend that yesterday?   I also don't know anything about [facility] and why they would be calling?    [name] [name] is working on [employer] however this would not be thru [employer].

## 2019-01-13 NOTE — Telephone Encounter (Signed)
Pt called to see about the compounded benadryl because she has not heard anything and also the [facility] call to talk with a nurse about this patient. [phone]

## 2019-01-13 NOTE — Assessment & Plan Note (Signed)
Well-controlled.  Continue Flovent 110 2 puffs daily.  May use albuterol rescue inhaler 2 puffs or nebulizer every 4 to 6 hours as needed for shortness of breath, chest tightness, coughing, and wheezing. May use albuterol rescue inhaler 2 puffs 5 to 15 minutes prior to strenuous physical activities. Monitor frequency of use.

## 2019-01-13 NOTE — Telephone Encounter (Signed)
Patient called stating benadryl is time sensitive and she would like to talk with a nurse about it, please call patient.

## 2019-01-13 NOTE — Telephone Encounter (Signed)
Please see alternative telephone contact

## 2019-01-13 NOTE — Telephone Encounter (Signed)
I spoke with the pharmacist while patient was in office. They informed me that they could do the liquid which would only last 14 days or the capsules which would last 6 months. I spoke with patient and she did request the capsules due to short life of the liquid.

## 2019-01-13 NOTE — Telephone Encounter (Signed)
Ok thanks.   Is there a way to make it an urgent referral.   Pt has been to the office 2 days in a row this week for subjective feeling of throat swelling.

## 2019-01-13 NOTE — Telephone Encounter (Signed)
I spoke with patient tonight in regards to the corn-free diphenhydramine capsules. Patient expressed concern about not being able to eat anything as well as not having any antihistamines on board. I did inform her to keep the Xyzal & Zyrtec on hand in the event she needs it so that she does not have to go straight for the epinephrine device. I let her know that once I was able to in the morning I would get the prescription taken care of. I also let her know that she is scheduled for an office visit tomorrow and not a oral/drug challenge. I explained that those do require more time and more hands on deck in the event of a reaction. Patient did verbalize understanding and will come in tomorrow as scheduled.    Can you please advise on dosing instructions and amount to be dispensed for the corn-free diphenhydramine capsules. Thank you.

## 2019-01-13 NOTE — Telephone Encounter (Signed)
[facility] is calling to follow up about the patients prescription.   Please Advise.

## 2019-01-13 NOTE — Assessment & Plan Note (Signed)
Continue to avoid fresh fruits and fresh vegetables that cause perioral pruritus.

## 2019-01-13 NOTE — Telephone Encounter (Signed)
Patient does have an appointment with Dr Delorse Lek 01/14/2019

## 2019-01-14 ENCOUNTER — Ambulatory Visit (INDEPENDENT_AMBULATORY_CARE_PROVIDER_SITE_OTHER): Payer: No Typology Code available for payment source | Admitting: Allergy

## 2019-01-14 ENCOUNTER — Encounter: Payer: Self-pay | Admitting: Allergy

## 2019-01-14 VITALS — BP 118/78 | HR 86 | Temp 98.3°F | Resp 20

## 2019-01-14 DIAGNOSIS — R0989 Other specified symptoms and signs involving the circulatory and respiratory systems: Secondary | ICD-10-CM

## 2019-01-14 DIAGNOSIS — K219 Gastro-esophageal reflux disease without esophagitis: Secondary | ICD-10-CM | POA: Diagnosis not present

## 2019-01-14 DIAGNOSIS — T7840XD Allergy, unspecified, subsequent encounter: Secondary | ICD-10-CM

## 2019-01-14 DIAGNOSIS — J3089 Other allergic rhinitis: Secondary | ICD-10-CM | POA: Diagnosis not present

## 2019-01-14 DIAGNOSIS — T781XXD Other adverse food reactions, not elsewhere classified, subsequent encounter: Secondary | ICD-10-CM

## 2019-01-14 MED ORDER — FAMOTIDINE 40 MG PO TABS: 40.0000 mg | ORAL_TABLET | Freq: Two times a day (BID) | ORAL | 5 refills | Status: DC

## 2019-01-14 MED ORDER — DIPHENHYDRAMINE HCL 25 MG PO CAPS: 25.0000 mg | ORAL_CAPSULE | Freq: Four times a day (QID) | ORAL | 1 refills | Status: DC | PRN

## 2019-01-14 MED ORDER — LORATADINE 10 MG PO TABS: 10.0000 mg | ORAL_TABLET | Freq: Every day | ORAL | 5 refills | Status: DC

## 2019-01-14 NOTE — Progress Notes (Signed)
Follow-up Note  RE: [name] [name] MRN: [id number] DOB: 12/27/1985 Date of Office Visit: 01/14/2019   History of present illness: [name] [name] is a 33 y.o. female presenting today for follow-up of recent adverse events.  She has a history of food allergy to corn, nuts, soybean she has been very concerned that she has been having reactions to new foods or foods that may contain some corn products.  I saw her on 1/31/2020After she was seen in the ED for a potential reaction after eating homemade lasagna.  Symptoms improved from this however she continues to state that she feels like when she eats that she has a weird sensation in her throat.  She was seen by Dr. Selena BattenKim on 01/12/2019 after she again had the throat sensations after eating and she took a Benadryl and when she did not have any improvement she took additional Benadryl and then believes that the Benadryl may have caused her reaction.  She states the Benadryl that she was using contain high fructose corn syrup that she was concerned about the potential corn content and this Benadryl.  Dr. Selena BattenKim I recommended that she I did have access to compound that Benadryl that did not have any corn product whatsoever.  She is nervous to even take this product and would like to come into the office for an observed challenge.  She then returned to the office on yesterday to 03/30/2019 after feeling again his symptoms of of throat discomfort and feeling of of throat tightness.  She was administered IM Benadryl and she was able to discharge home however she already had this appointment today as scheduled that she wanted to keep this.  We have placed an ENT referral for urgent and she has not yet had this scheduled.  She is very anxious surrounding food and states that she has lost weight and does not know what she can eat.  She states this morning she had half an egg because she was nervous to eat.  She states she did not have any problems though with eating the egg. She  is taking Claritin and famotidine.  She states that she was also taking Xyzal but she feels that every time she has been taking the Xyzal that she feels a a lump sensation in her throat that she does not want to continue on the Xyzal.  Review of systems: Review of Systems  Constitutional: Positive for weight loss. Negative for chills, fever and malaise/fatigue.  HENT: Positive for sore throat. Negative for congestion, ear discharge, nosebleeds and sinus pain.   Eyes: Negative for pain, discharge and redness.  Respiratory: Negative for cough, shortness of breath, wheezing and stridor.   Cardiovascular: Negative for chest pain.  Gastrointestinal: Negative for abdominal pain, constipation, diarrhea, heartburn, nausea and vomiting.  Musculoskeletal: Negative for joint pain.  Skin: Positive for itching. Negative for rash.  Neurological: Negative for headaches.    All other systems negative unless noted above in HPI  Past medical/social/surgical/family history have been reviewed and are unchanged unless specifically indicated below.  No changes  Medication List: Allergies as of 01/14/2019      Reactions   Other    Tree nuts    Peanut-containing Drug Products Anaphylaxis   Corn-containing Products    Soy Allergy    Tobramycin Other (See Comments)   Caused dizziness   Ketoconazole Rash      Medication List       Accurate as of January 14, 2019  4:58 PM.  Always use your most recent med list.        amitriptyline 10 MG tablet Commonly known as:  ELAVIL Take 1 tablet (10 mg total) by mouth at bedtime.   cyclobenzaprine 5 MG tablet Commonly known as:  FLEXERIL Take 5 mg by mouth 3 (three) times daily as needed for muscle spasms.   diphenhydrAMINE 25 mg capsule Commonly known as:  BENADRYL Take 1-2 capsules (25-50 mg total) by mouth every 6 (six) hours as needed.   EPINEPHrine 0.3 mg/0.3 mL Soaj injection Commonly known as:  EPI-PEN Inject 0.3 mLs (0.3 mg total) into the  muscle as needed for anaphylaxis.   famotidine 40 MG tablet Commonly known as:  PEPCID Take 1 tablet (40 mg total) by mouth 2 (two) times daily.   fluticasone 110 MCG/ACT inhaler Commonly known as:  FLOVENT HFA Inhale 1 puff into the lungs 2 (two) times daily.   fluticasone 50 MCG/ACT nasal spray Commonly known as:  FLONASE Place 1 spray into both nostrils as needed for allergies or rhinitis.   loratadine 10 MG tablet Commonly known as:  CLARITIN Take 1 tablet (10 mg total) by mouth daily.   meclizine 25 MG tablet Commonly known as:  ANTIVERT Take 1 tablet (25 mg total) by mouth 3 (three) times daily as needed for dizziness.   meloxicam 15 MG tablet Commonly known as:  MOBIC Take 15 mg by mouth daily.   montelukast 5 MG chewable tablet Commonly known as:  SINGULAIR Chew 1 tablet (5 mg total) by mouth at bedtime.   naproxen 500 MG tablet Commonly known as:  NAPROSYN Take 500 mg by mouth 2 (two) times daily as needed for moderate pain.   ondansetron 4 MG disintegrating tablet Commonly known as:  ZOFRAN-ODT Take 4 mg by mouth every 8 (eight) hours as needed for nausea or vomiting (headaches).   predniSONE 20 MG tablet Commonly known as:  DELTASONE 2 tabs po daily x 4 days   predniSONE 10 MG tablet Commonly known as:  DELTASONE Take 20mg  2 times daily for 5 days, 10mg  daily for 5 days, then stop.   PROAIR HFA 108 (90 Base) MCG/ACT inhaler Generic drug:  albuterol Inhale 2 puffs into the lungs every 6 (six) hours as needed for wheezing or shortness of breath.       Known medication allergies: Allergies  Allergen Reactions  . Other     Tree nuts   . Peanut-Containing Drug Products Anaphylaxis  . Corn-Containing Products   . Soy Allergy   . Tobramycin Other (See Comments)    Caused dizziness  . Ketoconazole Rash     Physical examination: Blood pressure 118/78, pulse 86, temperature 98.3 F (36.8 C), temperature source Oral, resp. rate 20, last menstrual  period 12/28/2018, SpO2 98 %.  General: Alert, interactive, in no acute distress. HEENT: PERRLA, TMs pearly gray, turbinates moderately edematous with clear discharge, post-pharynx non erythematous. Neck: Supple without lymphadenopathy. Lungs: Clear to auscultation without wheezing, rhonchi or rales. {no increased work of breathing. CV: Normal S1, S2 without murmurs. Abdomen: Nondistended, nontender. Skin: Warm and dry, without lesions or rashes. Extremities:  No clubbing, cyanosis or edema. Neuro:   Grossly intact.  Diagnositics/Labs: None today  Assessment and plan: Patient Instructions  Allergic reaction  - Continue all of the daily medications below:                A.  Flonase 1-2 sprays each nostril daily  B.  Flovent 110 -2 inhalations daily               C.  Montelukast 10 mg -1 tablet daily               D.  Zyrtec liquid 10mg  twice a day               E.  Loratadine/Claritin 10 mg -2 tablets at AM               F.  Famotidine 40 mg -1 tablet at AM and PM -[facility] for a compound Benadryl capsule that does not contain any corn products whatsoever for as needed use.  She is still uncomfortable with this and would like to have a challenge in the office to this medication.  She has an appointment next week for a challenge to ensure that she tolerates this medication if she were to need to use it. -We will not use Xyzal due to increased throat discomfort - Continue prednisone as directed from 01/13/2019 visit: 20mg  twice daily x 5 days then 10mg  twice daily x 5 days, then STOP. -We have called to make the ENT referral urgent so it can be scheduled quickly so that a evaluation can be done of her airway - Continue to keep track of reactions and note any triggers.  - I did discuss with her Xolair again including side effects that can occur.  As she seems to be more on board with starting Xolair at this time and is  already made an appointment to come in to get her first injection. -I again discussed with her she needs to be cautious with foods but she does not need to be scared to eat and that foods that she has tolerated well in the past that she should continue to eat these foods.  She will be careful with reading labels and ingredients to ensure that she is not consuming any corn based products, as well as nuts and soybean which have been positive in the past on IgE testing.   Follow up in as previously scheduled  I appreciate the opportunity to take part in Lilley's care. Please do not hesitate to contact me with questions.  Sincerely,   Margo Aye, MD Allergy/Immunology [facility]

## 2019-01-14 NOTE — Patient Instructions (Addendum)
Allergic reaction  - Continue all of the daily medications below:                A.  Flonase 1-2 sprays each nostril daily               B.  Flovent 110 -2 inhalations daily               C.  Montelukast 10 mg -1 tablet daily               D. Zyrtec liquid 10mg  twice a day               E.  Loratadine/Claritin 10 mg -2 tablets at AM               F.  Famotidine 40 mg -1 tablet at AM and PM -[facility] compound Benadryl capsule that does not contain any corn products whatsoever for as needed use.  She is still uncomfortable with this and would like to have a challenge in the office to this medication.  She has an appointment next week for a challenge to ensure that she tolerates this medication if she were to need to use it. -We will not use Xyzal due to increased throat discomfort - Continue prednisone as directed from 01/13/2019 visit: 20mg  twice daily x 5 days then 10mg  twice daily x 5 days, then STOP. -We have called to make the ENT referral urgent so it can be scheduled quickly so that a evaluation can be done of her airway - Continue to keep track of reactions and note any triggers.  - I did discuss with her Xolair again including side effects that can occur.  As she seems to be more on board with starting Xolair at this time and is already made an appointment to come in to get her first injection. -I again discussed with her she needs to be cautious with foods but she does not need to be scared to eat and that foods that she has tolerated well in the past that she should continue to eat these foods.  She will be careful with reading labels and ingredients to ensure that she is not consuming any corn based products, as well as nuts and soybean which have been positive in the past on IgE testing.   Follow up in as previously scheduled

## 2019-01-14 NOTE — Telephone Encounter (Signed)
How many mg of diphenhydramine is in a capsule?  If it's 25mg  capsules, you can write take 1-2 capsules every 6 hours as needed. Dispense number 60. One refill.  Thank you.

## 2019-01-14 NOTE — Addendum Note (Signed)
Addended by: Lorrin Mais on: 01/14/2019 12:57 PM   Modules accepted: Orders

## 2019-01-14 NOTE — Addendum Note (Signed)
Addended by: Mliss Fritz I on: 01/14/2019 08:43 AM   Modules accepted: Orders

## 2019-01-15 DIAGNOSIS — R0989 Other specified symptoms and signs involving the circulatory and respiratory systems: Secondary | ICD-10-CM | POA: Insufficient documentation

## 2019-01-15 LAB — ALLERGENS(7)
Brazil Nut IgE: <0.1 kU/L
F020-IGE ALMOND: 1.54 kU/L — AB
F202-IgE Cashew Nut: <0.1 kU/L
Hazelnut (Filbert) IgE: 6.52 kU/L — AB
Peanut IgE: 0.32 kU/L — AB
Pecan Nut IgE: <0.1 kU/L
Walnut IgE: <0.1 kU/L

## 2019-01-15 LAB — ALLERGENS W/TOTAL IGE AREA 2
Alternaria Alternata IgE: 1.79 kU/L — AB
Aspergillus Fumigatus IgE: 0.75 kU/L — AB
Bermuda Grass IgE: 0.11 kU/L — AB
Cat Dander IgE: 1.73 kU/L — AB
Cladosporium Herbarum IgE: 0.57 kU/L — AB
Common Silver Birch IgE: 7.66 kU/L — AB
D001-IGE D PTERONYSSINUS: 0.57 kU/L — AB
D002-IGE D FARINAE: 0.71 kU/L — AB
Dog Dander IgE: 0.68 kU/L — AB
Elm, American IgE: 0.13 kU/L — AB
IgE (Immunoglobulin E), Serum: 92 IU/mL (ref 6–495)
Johnson Grass IgE: 0.1 kU/L — AB
Maple/Box Elder IgE: 0.27 kU/L — AB
Mouse Urine IgE: <0.1 kU/L
Oak, White IgE: 7.4 kU/L — AB
Pecan, Hickory IgE: 0.18 kU/L — AB
Penicillium Chrysogen IgE: 0.3 kU/L — AB
Pigweed, Rough IgE: 0.14 kU/L — AB
Ragweed, Short IgE: <0.1 kU/L
Sheep Sorrel IgE Qn: 0.3 kU/L — AB
T006-IGE CEDAR, MOUNTAIN: 0.17 kU/L — AB
Timothy Grass IgE: <0.1 kU/L
White Mulberry IgE: <0.1 kU/L

## 2019-01-15 LAB — ALLERGEN SOYBEAN: Soybean IgE: 0.33 kU/L — AB

## 2019-01-15 LAB — ALPHA-GAL PANEL
Alpha Gal IgE*: <0.1 kU/L (ref <0.10)
Class Interpretation: 0
Class Interpretation: 0
LAMB CLASS INTERPRETATION: 0
Lamb/Mutton (Ovis spp) IgE: <0.1 kU/L (ref <0.35)
Pork (Sus spp) IgE: <0.1 kU/L (ref <0.35)

## 2019-01-15 LAB — ALLERGEN, CORN F8: Allergen Corn, IgE: 0.15 kU/L — AB

## 2019-01-15 LAB — ALLERGEN, WHEAT, F4: Wheat IgE: <0.1 kU/L

## 2019-01-15 LAB — TRYPTASE: Tryptase: 2.2 ug/L (ref 2.2–13.2)

## 2019-01-15 LAB — ALLERGEN PEA F12: Allergen Green Pea IgE: <0.1 kU/L

## 2019-01-15 NOTE — Telephone Encounter (Signed)
Patient is being evaluated today at [facility]

## 2019-01-16 ENCOUNTER — Telehealth: Payer: Self-pay | Admitting: *Deleted

## 2019-01-16 NOTE — Telephone Encounter (Signed)
Patient called stating she is having chest heavyness, shortness of breath. Patient wants to know what does she need to do. Patient states last night was rough for her. Please advise

## 2019-01-16 NOTE — Telephone Encounter (Signed)
Patient called stating she went to her PCP and they done a xray and a EKG, the xray came back fine and the EKG showed "t-changes" and this was done by Dr Luciana Axe if Dr Delorse Lek wants any information. Patient would like a return call she is concerned if the antihitamines could be causes her EKG to be off, patient also wants to know what she needs to do at this point. Please call patient

## 2019-01-16 NOTE — Telephone Encounter (Signed)
Called pt and informed her antihistamines usually don't cause any changes in EKG's. She stated she called her PCP and they referred her to a cardiologist. She wasn't too pleased with my answer, she just hung the phone up.

## 2019-01-16 NOTE — Telephone Encounter (Signed)
Dr. Kim can you please advise and thank you.  

## 2019-01-16 NOTE — Telephone Encounter (Signed)
Yes if having a chest heaviness advised she go to ED for evaluation as she does not have a history of frank asthma and too my knowledge has never had an breathing complaints.  I do not feel that her constellation of symptoms are due to allergic reactions to foods.

## 2019-01-16 NOTE — Telephone Encounter (Signed)
Yes discussed pt call with Lachelle prior to her calling pt back.  Antihistamines that our available and that she is using now have not be known to cause any cardiac issues or EKG changes.    Thus can not correlate her EKG findings with antihistamine use.   Thus if she has changes on her EKG this needs to be discussed with her PCP regarding next steps in evaluation (re: cardiology referral).

## 2019-01-16 NOTE — Telephone Encounter (Signed)
Spoke to Dr Lorenz Coaster - advised for patient to go to the ER Spoke to the patient Expressed that Dr Delorse Lek wanted her to go to ER to get a full work up, if she needs a spiro, or a chest x ray , or breathing treatments, the ER is the best place for her since her symptoms are worsening.   Patient stated she may call her PCP and try to be seen there, she did not want to go to the ER due to flu, but she would if needed.

## 2019-01-19 NOTE — Telephone Encounter (Signed)
I cannot wait to see what their thoughts are on this. I am sure it is going to be fascinating.  Malachi Bonds, MD [facility]

## 2019-01-21 ENCOUNTER — Ambulatory Visit (INDEPENDENT_AMBULATORY_CARE_PROVIDER_SITE_OTHER): Payer: No Typology Code available for payment source | Admitting: Allergy

## 2019-01-21 ENCOUNTER — Encounter: Payer: No Typology Code available for payment source | Admitting: Allergy

## 2019-01-21 ENCOUNTER — Encounter: Payer: Self-pay | Admitting: Allergy

## 2019-01-21 VITALS — BP 116/72 | HR 89 | Resp 16

## 2019-01-21 DIAGNOSIS — J3089 Other allergic rhinitis: Secondary | ICD-10-CM

## 2019-01-21 DIAGNOSIS — T7800XD Anaphylactic reaction due to unspecified food, subsequent encounter: Secondary | ICD-10-CM | POA: Diagnosis not present

## 2019-01-21 MED ORDER — HYDROXYZINE HCL 10 MG PO TABS: 10.0000 mg | ORAL_TABLET | Freq: Three times a day (TID) | ORAL | 5 refills | Status: DC | PRN

## 2019-01-21 NOTE — Patient Instructions (Addendum)
Allergic reaction  - Continue medications below:                A.  Flonase 1-2 sprays each nostril daily for nasal congestion/drainage               B.  Flovent 110 -2 inhalations daily as needed for cough, wheeze or shortness of breath               C.  Montelukast 10 mg -1 tablet daily               D. Zyrtec liquid 10mg  1-2 times a day as needed               E.  Claritin 10 mg - 1-2 times a day as needed               F.  Famotidine 20 mg -1-2 times a day as needed    G.  Hydroxyzine 10 mg - 1 tablet up to 3 times a day as needed for allergic reaction  -She will be doing a 2-week trial of a PPI to see if she is having any reflux symptoms that can be better controlled - Continue to keep track of reactions and note any triggers.  - I did discuss with her Xolair again including side effects that can occur.  As she seems to be more on board with starting Xolair at this time and is already made an appointment to come in to get her first injection. -She will like to have an observed challenge to the hydroxyzine to make sure she tolerates this and we will go ahead and have her do this on Friday in 2 days. -I again discussed with her she needs to be cautious with foods but she does not need to be scared to eat and that foods that she has tolerated well in the past that she should continue to eat these foods.  She will be careful with reading labels and ingredients to ensure that she is not consuming any corn based products, as well as nuts and soybean which have been positive in the past on IgE testing.  -The eventual goal is to have her start allergen immunotherapy to better control her allergic rhinitis -We also discussed food immunotherapy however she would need to be at maintenance of her allergen immunotherapy before we will consider starting on food immunotherapy.  Follow up in in 2 days for observed hydroxyzine challenge

## 2019-01-21 NOTE — Progress Notes (Signed)
Follow-up Note  RE: [name] [name] MRN: [id number] DOB: 19-Aug-1986 Date of Office Visit: 01/21/2019   History of present illness: [name] [name] is a 33 y.o. female presenting today to discuss her previous recent reactions and how to move forward.  She was last seen in the office on January 14, 2019 myself.  Since this visit she says she had an episode where she felt she was having chest heaviness and some tightness which was a new complaint for her.  She went to her PCP and had a chest x-ray that was negative and an EKG that per the patient showed "T wave changes".  She is being referred to cardiology for this.  She feels that the amount of antihistamines that she was on may have caused the EKG changes.  She has since stopped all of the antihistamines.  She states at this time that she does feel better and just wants to have access to Zyrtec and Claritin for as needed use.  She has been concerned with certain formulations of Benadryl that she would like to just avoid Benadryl completely and would like to have her rescue medication of the hydroxyzine.  She would like to come in for a observed challenge to make sure that she tolerates this.  She states since her last visit that she did start eating more foods again and actually bought a bread maker and has been making her own breads.  She states she has gained 5 pounds after losing about 12 when she states she was only able to eat eggs.  She would like to proceed with Xolair and eventually with allergen immunotherapy.  She would like for this to be discussed today.  She did have her ENT evaluation since her last visit that was reassuring and did not show any evidence of anatomical abnormalities in the posterior oropharynx..  Review of systems: Review of Systems  Constitutional: Positive for weight loss. Negative for chills, fever and malaise/fatigue.  HENT: Positive for congestion and sore throat. Negative for ear discharge, ear pain, nosebleeds and  sinus pain.   Eyes: Negative for pain, discharge and redness.  Respiratory: Negative for cough, sputum production, shortness of breath and wheezing.   Cardiovascular: Positive for chest pain. Negative for palpitations.  Gastrointestinal: Negative for abdominal pain, constipation, diarrhea, nausea and vomiting.  Musculoskeletal: Negative for joint pain.  Skin: Positive for itching. Negative for rash.  Neurological: Negative for headaches.    All other systems negative unless noted above in HPI  Past medical/social/surgical/family history have been reviewed and are unchanged unless specifically indicated below.  No changes  Medication List: Allergies as of 01/21/2019      Reactions   Other    Tree nuts    Peanut-containing Drug Products Anaphylaxis   Corn-containing Products    Soy Allergy    Tobramycin Other (See Comments)   Caused dizziness   Ketoconazole Rash      Medication List       Accurate as of January 21, 2019  2:58 PM. Always use your most recent med list.        amitriptyline 10 MG tablet Commonly known as:  ELAVIL Take 1 tablet (10 mg total) by mouth at bedtime.   cyclobenzaprine 5 MG tablet Commonly known as:  FLEXERIL Take 5 mg by mouth 3 (three) times daily as needed for muscle spasms.   diphenhydrAMINE 25 mg capsule Commonly known as:  BENADRYL Take 1-2 capsules (25-50 mg total) by mouth every 6 (six)  hours as needed.   EPINEPHrine 0.3 mg/0.3 mL Soaj injection Commonly known as:  EPI-PEN Inject 0.3 mLs (0.3 mg total) into the muscle as needed for anaphylaxis.   famotidine 40 MG tablet Commonly known as:  PEPCID Take 1 tablet (40 mg total) by mouth 2 (two) times daily.   fluticasone 110 MCG/ACT inhaler Commonly known as:  FLOVENT HFA Inhale 1 puff into the lungs 2 (two) times daily.   fluticasone 50 MCG/ACT nasal spray Commonly known as:  FLONASE Place 1 spray into both nostrils as needed for allergies or rhinitis.   hydrOXYzine 10 MG  tablet Commonly known as:  ATARAX/VISTARIL Take 1 tablet (10 mg total) by mouth 3 (three) times daily as needed.   loratadine 10 MG tablet Commonly known as:  CLARITIN Take 1 tablet (10 mg total) by mouth daily.   meclizine 25 MG tablet Commonly known as:  ANTIVERT Take 1 tablet (25 mg total) by mouth 3 (three) times daily as needed for dizziness.   meloxicam 15 MG tablet Commonly known as:  MOBIC Take 15 mg by mouth daily.   montelukast 5 MG chewable tablet Commonly known as:  SINGULAIR Chew 1 tablet (5 mg total) by mouth at bedtime.   naproxen 500 MG tablet Commonly known as:  NAPROSYN Take 500 mg by mouth 2 (two) times daily as needed for moderate pain.   ondansetron 4 MG disintegrating tablet Commonly known as:  ZOFRAN-ODT Take 4 mg by mouth every 8 (eight) hours as needed for nausea or vomiting (headaches).   predniSONE 20 MG tablet Commonly known as:  DELTASONE 2 tabs po daily x 4 days   predniSONE 10 MG tablet Commonly known as:  DELTASONE Take 20mg  2 times daily for 5 days, 10mg  daily for 5 days, then stop.   PROAIR HFA 108 (90 Base) MCG/ACT inhaler Generic drug:  albuterol Inhale 2 puffs into the lungs every 6 (six) hours as needed for wheezing or shortness of breath.       Known medication allergies: Allergies  Allergen Reactions  . Other     Tree nuts   . Peanut-Containing Drug Products Anaphylaxis  . Corn-Containing Products   . Soy Allergy   . Tobramycin Other (See Comments)    Caused dizziness  . Ketoconazole Rash     Physical examination: Blood pressure 116/72, pulse 89, resp. rate 16, last menstrual period 12/28/2018, SpO2 98 %.  General: Alert, interactive, in no acute distress. HEENT: PERRLA, TMs pearly gray, turbinates moderately edematous without discharge, post-pharynx non erythematous. Neck: Supple without lymphadenopathy. Lungs: Clear to auscultation without wheezing, rhonchi or rales. {no increased work of breathing. CV: Normal  S1, S2 without murmurs. Abdomen: Nondistended, nontender. Skin: Warm and dry, without lesions or rashes. Extremities:  No clubbing, cyanosis or edema. Neuro:   Grossly intact.  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  Flonase 1-2 sprays each nostril daily for nasal congestion/drainage               B.  Flovent 110 -2 inhalations daily as needed for cough, wheeze or shortness of breath               C.  Montelukast 10 mg -1 tablet daily               D. Zyrtec liquid  10mg  1-2 times a day as needed               E.  Claritin 10 mg - 1-2 times a day as needed               F.  Famotidine  20 mg -1-2 times a day as needed    G.  Hydroxyzine 10 mg - 1 tablet up to 3 times a day as needed for allergic reaction  -She will be doing a 2-week trial of a PPI to see if she is having any reflux symptoms that can be better controlled - Continue to keep track of reactions and note any triggers.  - I did discuss with her Xolair again including side effects that can occur.  As she seems to be more on board with starting Xolair at this time and is already made an appointment to come in to get her first injection. -She will like to have an observed challenge to the hydroxyzine to make sure she tolerates this and we will go ahead and have her do this on Friday in 2 days. -I again discussed with her she needs to be cautious with foods but she does not need to be scared to eat and that foods that she has tolerated well in the past that she should continue to eat these foods.  She will be careful with reading labels and ingredients to ensure that she is not consuming any corn based products, as well as nuts and soybean which have been positive in the past on IgE testing.  -The eventual goal is to have her start allergen immunotherapy to better control her allergic rhinitis -We also  discussed food immunotherapy however she would need to be at maintenance of her allergen immunotherapy before we will consider starting on food immunotherapy.  Follow up in in 2 days for observed hydroxyzine challenge  I appreciate the opportunity to take part in Shenita's care. Please do not hesitate to contact me with questions.  Sincerely,   Margo Aye, MD Allergy/Immunology [facility]

## 2019-01-23 ENCOUNTER — Encounter: Payer: Self-pay | Admitting: Allergy

## 2019-01-23 ENCOUNTER — Ambulatory Visit (INDEPENDENT_AMBULATORY_CARE_PROVIDER_SITE_OTHER): Payer: No Typology Code available for payment source | Admitting: Allergy

## 2019-01-23 VITALS — BP 118/78 | HR 72 | Resp 16

## 2019-01-23 DIAGNOSIS — J3089 Other allergic rhinitis: Secondary | ICD-10-CM | POA: Diagnosis not present

## 2019-01-23 DIAGNOSIS — T7800XD Anaphylactic reaction due to unspecified food, subsequent encounter: Secondary | ICD-10-CM

## 2019-01-23 NOTE — Progress Notes (Signed)
Follow-up Note  RE: [name] [name] MRN: [id number] DOB: 02-17-1986 Date of Office Visit: 01/23/2019   History of present illness: [name] [name] is a 33 y.o. female presenting today oral challenge to hydroxyzine.  She was last seen in the office on 01/21/2019.  At this visit we discussed having an alternative to benadryl and [name] [name] asked for hydroxyzine which is an appropriate alternative.  She wanted to try her first dose here in the office to ensure she tolerated.   She does state last night while eating out she ate grilled chicken, french fry which she considered to be safe foods and states that she developed that lump sensation in her throat but symptoms did not progress.  She took zyrtec.    Review of systems: Review of Systems  Constitutional: Negative for chills, fever and malaise/fatigue.  HENT: Positive for congestion and sore throat. Negative for ear discharge, nosebleeds and sinus pain.   Eyes: Negative for pain, discharge and redness.  Respiratory: Negative for cough, shortness of breath and wheezing.   Cardiovascular: Negative for chest pain.  Gastrointestinal: Negative for abdominal pain, constipation, diarrhea, nausea and vomiting.  Musculoskeletal: Negative for joint pain.  Skin: Negative for itching and rash.  Neurological: Negative for headaches.    All other systems negative unless noted above in HPI  Past medical/social/surgical/family history have been reviewed and are unchanged unless specifically indicated below.  No changes  Medication List: Allergies as of 01/23/2019      Reactions   Other    Tree nuts    Peanut-containing Drug Products Anaphylaxis   Corn-containing Products    Soy Allergy    Tobramycin Other (See Comments)   Caused dizziness   Ketoconazole Rash      Medication List       Accurate as of January 23, 2019 11:02 AM. Always use your most recent med list.        amitriptyline 10 MG tablet Commonly known as:  ELAVIL Take 1 tablet  (10 mg total) by mouth at bedtime.   cephALEXin 250 MG capsule Commonly known as:  KEFLEX Take 250 mg by mouth 2 (two) times daily.   cyclobenzaprine 5 MG tablet Commonly known as:  FLEXERIL Take 5 mg by mouth 3 (three) times daily as needed for muscle spasms.   diphenhydrAMINE 25 mg capsule Commonly known as:  BENADRYL Take 1-2 capsules (25-50 mg total) by mouth every 6 (six) hours as needed.   EPINEPHrine 0.3 mg/0.3 mL Soaj injection Commonly known as:  EPI-PEN Inject 0.3 mLs (0.3 mg total) into the muscle as needed for anaphylaxis.   famotidine 40 MG tablet Commonly known as:  PEPCID Take 1 tablet (40 mg total) by mouth 2 (two) times daily.   fluticasone 110 MCG/ACT inhaler Commonly known as:  FLOVENT HFA Inhale 1 puff into the lungs 2 (two) times daily.   fluticasone 50 MCG/ACT nasal spray Commonly known as:  FLONASE Place 1 spray into both nostrils as needed for allergies or rhinitis.   hydrOXYzine 10 MG tablet Commonly known as:  ATARAX/VISTARIL Take 1 tablet (10 mg total) by mouth 3 (three) times daily as needed.   loratadine 10 MG tablet Commonly known as:  CLARITIN Take 1 tablet (10 mg total) by mouth daily.   meclizine 25 MG tablet Commonly known as:  ANTIVERT Take 1 tablet (25 mg total) by mouth 3 (three) times daily as needed for dizziness.   meloxicam 15 MG tablet Commonly known as:  MOBIC Take 15  mg by mouth daily.   montelukast 5 MG chewable tablet Commonly known as:  SINGULAIR Chew 1 tablet (5 mg total) by mouth at bedtime.   naproxen 500 MG tablet Commonly known as:  NAPROSYN Take 500 mg by mouth 2 (two) times daily as needed for moderate pain.   nitrofurantoin (macrocrystal-monohydrate) 100 MG capsule Commonly known as:  MACROBID Take 1 tablet after intercourse   ondansetron 4 MG disintegrating tablet Commonly known as:  ZOFRAN-ODT Take 4 mg by mouth every 8 (eight) hours as needed for nausea or vomiting (headaches).   predniSONE 20 MG  tablet Commonly known as:  DELTASONE 2 tabs po daily x 4 days   predniSONE 10 MG tablet Commonly known as:  DELTASONE Take 20mg  2 times daily for 5 days, 10mg  daily for 5 days, then stop.   PROAIR HFA 108 (90 Base) MCG/ACT inhaler Generic drug:  albuterol Inhale 2 puffs into the lungs every 6 (six) hours as needed for wheezing or shortness of breath.       Known medication allergies: Allergies  Allergen Reactions  . Other     Tree nuts   . Peanut-Containing Drug Products Anaphylaxis  . Corn-Containing Products   . Soy Allergy   . Tobramycin Other (See Comments)    Caused dizziness  . Ketoconazole Rash    Physical examination: Blood pressure 118/78, pulse 72, resp. rate 16, last menstrual period 12/28/2018, SpO2 99 %.  General: Alert, interactive, in no acute distress. HEENT: PERRLA, TMs pearly gray, turbinates moderately edematous without discharge, post-pharynx non erythematous. Neck: Supple without lymphadenopathy. Lungs: Clear to auscultation without wheezing, rhonchi or rales. {no increased work of breathing. CV: Normal S1, S2 without murmurs. Abdomen: Nondistended, nontender. Skin: Warm and dry, without lesions or rashes. Extremities:  No clubbing, cyanosis or edema. Neuro:   Grossly intact.  Diagnositics/Labs: Observe drug challenge: Patient took hydroxyzine 10 mg tablet with an hour observation period.  She had no adverse events occurred during this time and she tolerated the dose well.  She had normal vitals upon discharge.  Assessment and plan:   Allergic reaction  - Hydroxyzine observed challenge performed today without adverse events and ok to take in the future if needed  - Continue medications below:                A.  Flonase 1-2 sprays each nostril daily for nasal congestion/drainage               B.  Flovent 110 -2 inhalations daily as needed for cough, wheeze or shortness of breath               C.  Montelukast 10 mg -1 tablet daily                D.  Zyrtec 10mg  1-2 times a day as needed               E.  Claritin 10 mg - 1-2 times a day as needed               F.  Famotidine 20 mg -1-2 times a day as needed    G.  Hydroxyzine 10 mg - 1-2 tablets up to 3 times a day as needed for allergic reaction  - Continue to keep track of reactions and note any triggers.  - Xolair monthly injections approval process has been started.  We will need your [facility] insurance card to finish approval process; please bring this card in.  Before we can provide with Xolair sample will have to have this card on file so we can continue monthly injections.  Xolair will help decrease allergic reaction episodes as well as provide itch control with reactions.   -Will plan to start allergen immunotherapy once we have been on Xolair for at least 3-4 months.   -We have also discussed food immunotherapy previously however would need to be at maintenance of her allergen immunotherapy before we will consider starting on food immunotherapy.  She will return on Tuesday 2/18 for her first Xolair injection pending that she has brought [place] her [facility] card.  I appreciate the opportunity to take part in Lynnel's care. Please do not hesitate to contact me with questions.  Sincerely,   Margo Aye, MD Allergy/Immunology [facility]

## 2019-01-23 NOTE — Patient Instructions (Addendum)
Allergic reaction  - Hydroxyzine observed challenge performed today without adverse events and ok to take in the future if needed  - Continue medications below:                A.  Flonase 1-2 sprays each nostril daily for nasal congestion/drainage               B.  Flovent 110 -2 inhalations daily as needed for cough, wheeze or shortness of breath               C.  Montelukast 10 mg -1 tablet daily               D.  Zyrtec 10mg  at bedtime and as needed during the day               E.  Claritin 10 mg as needed (prefer Zyrtec)               F.  Famotidine 20 mg  as needed    G.  Hydroxyzine 10 mg - 1-2 tablets up to 3 times a day as needed for allergic reaction  - Continue to keep track of reactions and note any triggers.  - Xolair monthly injections approval process has been started.  We will need your [facility] insurance card to finish approval process; please bring this card in.  Before we can provide with Xolair sample will have to have this card on file so we can continue monthly injections.  Xolair will help decrease allergic reaction episodes as well as provide itch control with reactions.   -Will plan to start allergen immunotherapy once we have been on Xolair for at least 3-4 months.   -We have also discussed food immunotherapy previously however would need to be at maintenance of her allergen immunotherapy before we will consider starting on food immunotherapy.  Follow-up Tuesday to start Xolair monthly injections

## 2019-01-27 ENCOUNTER — Ambulatory Visit (INDEPENDENT_AMBULATORY_CARE_PROVIDER_SITE_OTHER): Payer: No Typology Code available for payment source | Admitting: *Deleted

## 2019-01-27 DIAGNOSIS — L501 Idiopathic urticaria: Secondary | ICD-10-CM | POA: Diagnosis not present

## 2019-01-27 DIAGNOSIS — T7840XD Allergy, unspecified, subsequent encounter: Secondary | ICD-10-CM

## 2019-01-27 MED ORDER — OMALIZUMAB 150 MG ~~LOC~~ SOLR
300.0000 mg | SUBCUTANEOUS | Status: DC
  Administered 2019-01-27: 300 mg via SUBCUTANEOUS

## 2019-01-27 NOTE — Progress Notes (Signed)
Immunotherapy   Patient Details  Name: [name] [name] MRN: [id number] Date of Birth: 06-09-1986  01/27/2019  [name] [name] started injections for  Xolair 300 mg every 4 weeks  Frequency:Every 4 weeks Epi-Pen:Epi-Pen Available  Consent signed and patient instructions given. Patient waited in clinic 2 hours without difficulties   Bennye Alm 01/27/2019, 6:14 PM

## 2019-02-02 ENCOUNTER — Telehealth: Payer: Self-pay | Admitting: *Deleted

## 2019-02-02 MED ORDER — OMALIZUMAB 150 MG ~~LOC~~ SOLR: 300.0000 mg | SUBCUTANEOUS | 11 refills | Status: DC

## 2019-02-02 NOTE — Progress Notes (Signed)
Cardiology Office Note:    Date:  02/04/2019   ID:  [name] [name], DOB January 19, 1986, MRN [id number]  PCP:  [name], MD  Cardiologist:  Norman Herrlich, MD   Referring MD: [name], MD  ASSESSMENT:    1. Chest pain, unspecified type   2. Anaphylaxis, subsequent encounter    PLAN:    In order of problems listed above:  1. Atypical in the setting of allergic reaction anaphylaxis doubt cardiac ischemia.  Further evaluation check troponin if normal is not acute coronary syndrome echocardiogram regarding structurally normal heart.  With her allergic and anaphylactic reactions I would not challenge with contrast dye such as cardiac CTA and I do not think she needs an ischemic evaluation with a treadmill stress test. 2. Managed by allergy she has been referred to [facility] level program for evaluation  Next appointment this week for an EKG after taking her antihistamine in the office   Medication Adjustments/Labs and Tests Ordered: Current medicines are reviewed at length with the patient today.  Concerns regarding medicines are outlined above.  Orders Placed This Encounter  Procedures  . Troponin I -  . ECHOCARDIOGRAM COMPLETE   No orders of the defined types were placed in this encounter.    Chief Complaint  Patient presents with  . New Patient (Initial Visit)  . Shortness of Breath    happenes when she takes anithistamines   . Chest Pain    History of Present Illness:    [name] [name] is a 33 y.o. female who is being seen today for the evaluation of chest pain at the request of [name], MD.  She has a history of an accelerating pattern of frequent episodes of anaphylaxis which appear to be related to multiple food products is being seen by allergy.  With her recent episodes she has had a fullness in her throat flushing palpitation and afterwards she gets chest tightness that lasts for hours and at times recurs at rest.  On one instance she took a  bronchodilator and actually paradoxically made her worse with causing her heart to race and increasing shortness of breath.  She has no background history of heart disease the symptoms are not exertional not pleuritic and not relieved with rest.  They are clearly precipitated as part of her immune response.  She is advised to be seen by primary Care physician EKG showed T wave changes I do not have it and referred to my office her EKG today is near normal with nonspecific T wave flattening.  For further evaluation I asked her to have a troponin drawn today if normal excluding acute coronary syndrome in the setting of anaphylaxis and echocardiogram to sure we have a structurally normal heart.  I suspect her symptoms are noncardiac nonischemic in nature and part of a systemic illness and she tells me she is making arrangements to be seen by allergy at [facility] for second opinion.  She has to return to our office to have an EKG taken after taking her antihistamine to be sure that her QT interval is normal and no arrhythmias precipitated we will do this tomorrow or Friday. Past Medical History:  Diagnosis Date  . ADHD   . Angio-edema   . Asthma   . UTI (urinary tract infection)    hx of freq in 2018    Past Surgical History:  Procedure Laterality Date  . LEEP  2010  . WISDOM TOOTH EXTRACTION  2008  Current Medications: Current Meds  Medication Sig  . albuterol (PROAIR HFA) 108 (90 Base) MCG/ACT inhaler Inhale 2 puffs into the lungs every 6 (six) hours as needed for wheezing or shortness of breath.  . diphenhydrAMINE (BENADRYL) 25 mg capsule Take 1-2 capsules (25-50 mg total) by mouth every 6 (six) hours as needed.  Marland Kitchen EPINEPHrine 0.3 mg/0.3 mL IJ SOAJ injection Inject 0.3 mLs (0.3 mg total) into the muscle as needed for anaphylaxis.  . famotidine (PEPCID) 40 MG tablet Take 1 tablet (40 mg total) by mouth 2 (two) times daily.  . fluticasone (FLONASE) 50 MCG/ACT nasal spray Place 1 spray  into both nostrils as needed for allergies or rhinitis.  Marland Kitchen loratadine (CLARITIN) 10 MG tablet Take 1 tablet (10 mg total) by mouth daily.  Marland Kitchen omalizumab (XOLAIR) 150 MG injection Inject 300 mg into the skin every 28 (twenty-eight) days.  . ondansetron (ZOFRAN-ODT) 4 MG disintegrating tablet Take 4 mg by mouth every 8 (eight) hours as needed for nausea or vomiting (headaches).   . [DISCONTINUED] omalizumab (XOLAIR) 150 MG injection Inject 300 mg into the skin every 28 (twenty-eight) days.   Current Facility-Administered Medications for the 02/04/19 encounter (Office Visit) with Baldo Daub, MD  Medication  . omalizumab Geoffry Paradise) injection 300 mg     Allergies:   Benadryl [diphenhydramine]; Other; Peanut-containing drug products; Corn-containing products; Soy allergy; Tobramycin; and Ketoconazole   Social History   Socioeconomic History  . Marital status: Married    Spouse name: Not on file  . Number of children: 2  . Years of education: doctorate DNP  . Highest education level: Not on file  Occupational History    Comment: [employer], NP  Social Needs  . Financial resource strain: Not on file  . Food insecurity:    Worry: Not on file    Inability: Not on file  . Transportation needs:    Medical: Not on file    Non-medical: Not on file  Tobacco Use  . Smoking status: Never Smoker  . Smokeless tobacco: Never Used  Substance and Sexual Activity  . Alcohol use: Yes    Comment: 1-2 glasses wine/weekly  . Drug use: No  . Sexual activity: Not on file  Lifestyle  . Physical activity:    Days per week: Not on file    Minutes per session: Not on file  . Stress: Not on file  Relationships  . Social connections:    Talks on phone: Not on file    Gets together: Not on file    Attends religious service: Not on file    Active member of club or organization: Not on file    Attends meetings of clubs or organizations: Not on file    Relationship status: Not on file  Other  Topics Concern  . Not on file  Social History Narrative   Lives with husband, children.  Works at [employer].  Education: doctorate level.    Tea/coffee 2-5 x week     Family History: The patient's family history includes Cancer in her father; Diabetes in her father, maternal grandmother, and mother; Eczema in her sister; Heart disease in her father; Hyperthyroidism in her mother. There is no history of Allergic rhinitis, Asthma, or Urticaria.  ROS:   Review of Systems  Constitution: Positive for decreased appetite and weight loss.  HENT: Negative.   Eyes: Negative.   Cardiovascular: Positive for chest pain and palpitations.  Respiratory: Positive for shortness of breath and wheezing.  Endocrine: Negative.   Hematologic/Lymphatic: Negative.   Skin: Negative.   Musculoskeletal: Negative.   Gastrointestinal: Negative.   Genitourinary: Negative.   Neurological: Negative.   Psychiatric/Behavioral: Negative.   Allergic/Immunologic: Negative.    Please see the history of present illness.     All other systems reviewed and are negative.  EKGs/Labs/Other Studies Reviewed:    The following studies were reviewed today: Recent labs performed showed a normal CBC  EKG:  EKG is  ordered today.  The ekg ordered today demonstrates sinus rhythm near normal nonspecific T waves personally reviewed  Recent Labs: 01/08/2019: BUN 18; Creatinine, Ser 0.94; Hemoglobin 13.3; Platelets 268; Potassium 3.1; Sodium 136  Recent Lipid Panel No results found for: CHOL, TRIG, HDL, CHOLHDL, VLDL, LDLCALC, LDLDIRECT  Physical Exam:    VS:  BP (!) 126/92   Pulse 93   Ht 5\' 2"  (1.575 m)   Wt 136 lb (61.7 kg)   SpO2 92%   BMI 24.87 kg/m     Wt Readings from Last 3 Encounters:  02/04/19 136 lb (61.7 kg)  01/09/19 141 lb (64 kg)  01/08/19 142 lb (64.4 kg)     GEN: No urticaria well nourished, well developed in no acute distress HEENT: Normal NECK: No JVD; No carotid bruits LYMPHATICS: No  lymphadenopathy CARDIAC: RRR, no murmurs, rubs, gallops RESPIRATORY:  Clear to auscultation without rales, wheezing or rhonchi  ABDOMEN: Soft, non-tender, non-distended MUSCULOSKELETAL:  No edema; No deformity  SKIN: Warm and dry NEUROLOGIC:  Alert and oriented x 3 PSYCHIATRIC:  Normal affect     Signed, Norman Herrlich, MD  02/04/2019 12:16 PM    [facility]

## 2019-02-02 NOTE — Telephone Encounter (Signed)
Called patient and advised approval and copay card for Xolair done. Will send rx to [facility] outpatient and they will contact patient to pick up and bring to office for next injection. Patient verbalized understanding.

## 2019-02-04 ENCOUNTER — Encounter: Payer: Self-pay | Admitting: Allergy

## 2019-02-04 ENCOUNTER — Telehealth: Payer: Self-pay

## 2019-02-04 ENCOUNTER — Encounter: Payer: Self-pay | Admitting: Cardiology

## 2019-02-04 ENCOUNTER — Ambulatory Visit (INDEPENDENT_AMBULATORY_CARE_PROVIDER_SITE_OTHER): Payer: No Typology Code available for payment source | Admitting: Cardiology

## 2019-02-04 ENCOUNTER — Ambulatory Visit (INDEPENDENT_AMBULATORY_CARE_PROVIDER_SITE_OTHER): Payer: No Typology Code available for payment source | Admitting: Allergy

## 2019-02-04 VITALS — BP 110/70 | HR 92 | Resp 16

## 2019-02-04 VITALS — BP 126/92 | HR 93 | Ht 62.0 in | Wt 136.0 lb

## 2019-02-04 DIAGNOSIS — R079 Chest pain, unspecified: Secondary | ICD-10-CM

## 2019-02-04 DIAGNOSIS — T782XXD Anaphylactic shock, unspecified, subsequent encounter: Secondary | ICD-10-CM

## 2019-02-04 DIAGNOSIS — K219 Gastro-esophageal reflux disease without esophagitis: Secondary | ICD-10-CM | POA: Diagnosis not present

## 2019-02-04 DIAGNOSIS — R1310 Dysphagia, unspecified: Secondary | ICD-10-CM

## 2019-02-04 DIAGNOSIS — R1319 Other dysphagia: Secondary | ICD-10-CM

## 2019-02-04 DIAGNOSIS — J3089 Other allergic rhinitis: Secondary | ICD-10-CM

## 2019-02-04 DIAGNOSIS — T7840XD Allergy, unspecified, subsequent encounter: Secondary | ICD-10-CM

## 2019-02-04 DIAGNOSIS — T782XXA Anaphylactic shock, unspecified, initial encounter: Secondary | ICD-10-CM

## 2019-02-04 LAB — TROPONIN I: Troponin I: <0.01 ng/mL (ref 0.00–0.04)

## 2019-02-04 MED ORDER — HYDROXYZINE HCL 10 MG/5ML PO SYRP: 10.0000 mg | ORAL_SOLUTION | Freq: Three times a day (TID) | ORAL | 0 refills | Status: DC | PRN

## 2019-02-04 MED ORDER — ONDANSETRON 8 MG PO TBDP: 8.0000 mg | ORAL_TABLET | Freq: Three times a day (TID) | ORAL | 0 refills | Status: DC | PRN

## 2019-02-04 NOTE — Progress Notes (Signed)
Follow-up Note  RE: [name] [name] MRN: [id number] DOB: Dec 05, 1986 Date of Office Visit: 02/04/2019   History of present illness: [name] [name] is a 33 y.o. female presenting today after having a reaction last night.  She was last seen in the office on 01/23/19 for observed hydroxyzine challenge.  She got started on Xolair since this visit and tolerated first injection well without adverse events.   She states last night she developed symptoms of throat tightness and difficulty swallowing as well as chest pain sensation after eating dinner consisting of orzo pasta seasoned with salt and pepper, garlic powder with cream of chicken.  She states she made this dish on Tuesday of this week and sampled it without issue.  She has not had any issues with other pastas in the past or chicken.  She states she took hydroxyzine and then felt jittery after use and then took famotidine and then prednisone 20mg .  She states after about 90 minutes she felt better.  She again was very tearful today as she states she is only eating oatmeal.  She doesn't know what else to eat at this point.    She has a cardiology evaluation this morning after having an abnormal EKG in the past 2 weeks.    At this point she would like a second opinion with allergy.   She did bring in the pasta box that she cooked with after her initial reaction on 01/09/2019 as realized that her grocery delivery exchanged what she ordered with a gluten free pasta that was made with corn.  Thus this is likely what triggered this reaction.    Review of systems: Review of Systems  Constitutional: Positive for weight loss. Negative for chills, fever and malaise/fatigue.  HENT: Positive for congestion. Negative for ear discharge, nosebleeds, sinus pain and sore throat.   Eyes: Negative for pain, discharge and redness.  Respiratory: Positive for wheezing. Negative for cough, sputum production and shortness of breath.   Cardiovascular: Positive for  chest pain.  Gastrointestinal: Positive for abdominal pain and nausea. Negative for constipation, diarrhea and vomiting.  Musculoskeletal: Negative for joint pain.  Skin: Negative for itching and rash.  Neurological: Negative for dizziness, loss of consciousness and headaches.    All other systems negative unless noted above in HPI  Past medical/social/surgical/family history have been reviewed and are unchanged unless specifically indicated below.  No changes  Medication List: Allergies as of 02/04/2019      Reactions   Benadryl [diphenhydramine]    Chest Pain   Other    Tree nuts    Peanut-containing Drug Products Anaphylaxis   Corn-containing Products    Soy Allergy    Tobramycin Other (See Comments)   Caused dizziness   Ketoconazole Rash      Medication List       Accurate as of February 04, 2019  4:22 PM. Always use your most recent med list.        diphenhydrAMINE 25 mg capsule Commonly known as:  BENADRYL Take 1-2 capsules (25-50 mg total) by mouth every 6 (six) hours as needed.   EPINEPHrine 0.3 mg/0.3 mL Soaj injection Commonly known as:  EPI-PEN Inject 0.3 mLs (0.3 mg total) into the muscle as needed for anaphylaxis.   famotidine 40 MG tablet Commonly known as:  PEPCID Take 1 tablet (40 mg total) by mouth 2 (two) times daily.   fluticasone 50 MCG/ACT nasal spray Commonly known as:  FLONASE Place 1 spray into both nostrils as needed  for allergies or rhinitis.   hydrOXYzine 10 MG/5ML syrup Commonly known as:  ATARAX Take 5 mLs (10 mg total) by mouth 3 (three) times daily as needed.   loratadine 10 MG tablet Commonly known as:  CLARITIN Take 1 tablet (10 mg total) by mouth daily.   omalizumab 150 MG injection Commonly known as:  XOLAIR Inject 300 mg into the skin every 28 (twenty-eight) days.   ondansetron 4 MG disintegrating tablet Commonly known as:  ZOFRAN-ODT Take 4 mg by mouth every 8 (eight) hours as needed for nausea or vomiting  (headaches).   ondansetron 8 MG disintegrating tablet Commonly known as:  ZOFRAN ODT Take 1 tablet (8 mg total) by mouth every 8 (eight) hours as needed for nausea or vomiting.   PROAIR HFA 108 (90 Base) MCG/ACT inhaler Generic drug:  albuterol Inhale 2 puffs into the lungs every 6 (six) hours as needed for wheezing or shortness of breath.       Known medication allergies: Allergies  Allergen Reactions  . Benadryl [Diphenhydramine]     Chest Pain  . Other     Tree nuts   . Peanut-Containing Drug Products Anaphylaxis  . Corn-Containing Products   . Soy Allergy   . Tobramycin Other (See Comments)    Caused dizziness  . Ketoconazole Rash     Physical examination: Blood pressure 110/70, pulse 92, resp. rate 16.  General: Alert, interactive, tearful. HEENT: PERRLA, TMs pearly gray, turbinates moderately edematous with clear discharge, post-pharynx non erythematous. Neck: Supple without lymphadenopathy. Lungs: Clear to auscultation without wheezing, rhonchi or rales. {no increased work of breathing. CV: Normal S1, S2 without murmurs. Abdomen: Nondistended, nontender. Skin: Warm and dry, without lesions or rashes. Extremities:  No clubbing, cyanosis or edema. Neuro:   Grossly intact.  Diagnositics/Labs: None today  Assessment and plan:   Allergic reaction Dysphagia Allergic rhinitis GERD  - Continue medications below:                A.  Flonase 1-2 sprays each nostril daily for nasal congestion/drainage               B.  Flovent 110 -2 inhalations daily as needed for cough, wheeze or shortness of breath                C.  Zyrtec 10mg  at bedtime and as needed during the day                D.  Claritin 10 mg as needed (prefer Zyrtec)                E.  Famotidine 20 mg  as needed    F.  Hydroxyzine 10 mg - 1-2 tablets up to 3 times a day as needed for allergic reaction  - Continue to keep track of reactions and note any triggers.  - Xolair monthly injections  started to help decrease allergic reaction episodes as well as provide itch control with reactions   -Will plan to start allergen immunotherapy once we have been on Xolair for at least 3-4 months.   -We have also discussed food immunotherapy previously however would need to be at maintenance of her allergen immunotherapy before we will consider starting on food immunotherapy. -Will refer to [facility] for second opinion regarding these episodes - Will refer to GI for evaluation to rule-out EOE.  I am concerned with her continued episodes of dysphagia and throat tightness that it could be GI in nature and  not true IgE mediated food reactions as she is having symptoms after ingestion of foods she had been tolerating.   - she can benefit from re-visit with nutrition to help with meal planning as she is uncertain what are safe foods for her  Follow-up 3 months  I appreciate the opportunity to take part in Cybele's care. Please do not hesitate to contact me with questions.  Sincerely,   Margo Aye, MD Allergy/Immunology [facility]

## 2019-02-04 NOTE — Telephone Encounter (Addendum)
Dr. Delorse Lek would like an Urgent referral placed to GI to for dysphagia and to evaluate for EOE.   She would also like a referral placed for a second opinion at another allergist. Preferably Dr. Elijah Birk with [facility] to evaluate for recurrent allergy reactions. Can you please place these referrals? Please let me know if you need any additional information.

## 2019-02-04 NOTE — Patient Instructions (Addendum)
Medication Instructions:  Your physician recommends that you continue on your current medications as directed. Please refer to the Current Medication list given to you today.  If you need a refill on your cardiac medications before your next appointment, please call your pharmacy.   Lab work: Your physician recommends that you have a Troponin level drawn today.  If you have labs (blood work) drawn today and your tests are completely normal, you will receive your results only by: Marland Kitchen MyChart Message (if you have MyChart) OR . A paper copy in the mail If you have any lab test that is abnormal or we need to change your treatment, we will call you to review the results.  Testing/Procedures: An EKG was performed today.   You will be coming in for a REPEAT EKG please bring hydoxyzine dose to be taken in waiting area prior to visit for evaluation.   Your physician has requested that you have an echocardiogram. Echocardiography is a painless test that uses sound waves to create images of your heart. It provides your doctor with information about the size and shape of your heart and how well your heart's chambers and valves are working. This procedure takes approximately one hour. There are no restrictions for this procedures  FOR your info:   Echocardiogram An echocardiogram is a procedure that uses painless sound waves (ultrasound) to produce an image of the heart. Images from an echocardiogram can provide important information about:  Signs of coronary artery disease (CAD).  Aneurysm detection. An aneurysm is a weak or damaged part of an artery wall that bulges out from the normal force of blood pumping through the body.  Heart size and shape. Changes in the size or shape of the heart can be associated with certain conditions, including heart failure, aneurysm, and CAD.  Heart muscle function.  Heart valve function.  Signs of a past heart attack.  Fluid buildup around the  heart.  Thickening of the heart muscle.  A tumor or infectious growth around the heart valves. Tell a health care provider about:  Any allergies you have.  All medicines you are taking, including vitamins, herbs, eye drops, creams, and over-the-counter medicines.  Any blood disorders you have.  Any surgeries you have had.  Any medical conditions you have.  Whether you are pregnant or may be pregnant. What are the risks? Generally, this is a safe procedure. However, problems may occur, including:  Allergic reaction to dye (contrast) that may be used during the procedure. What happens before the procedure? No specific preparation is needed. You may eat and drink normally. What happens during the procedure?   An IV tube may be inserted into one of your veins.  You may receive contrast through this tube. A contrast is an injection that improves the quality of the pictures from your heart.  A gel will be applied to your chest.  A wand-like tool (transducer) will be moved over your chest. The gel will help to transmit the sound waves from the transducer.  The sound waves will harmlessly bounce off of your heart to allow the heart images to be captured in real-time motion. The images will be recorded on a computer. The procedure may vary among health care providers and hospitals. What happens after the procedure?  You may return to your normal, everyday life, including diet, activities, and medicines, unless your health care provider tells you not to do that. Summary  An echocardiogram is a procedure that uses painless sound  waves (ultrasound) to produce an image of the heart.  Images from an echocardiogram can provide important information about the size and shape of your heart, heart muscle function, heart valve function, and fluid buildup around your heart.  You do not need to do anything to prepare before this procedure. You may eat and drink normally.  After the  echocardiogram is completed, you may return to your normal, everyday life, unless your health care provider tells you not to do that. This information is not intended to replace advice given to you by your health care provider. Make sure you discuss any questions you have with your health care provider. Document Released: 11/23/2000 Document Revised: 12/29/2016 Document Reviewed: 12/29/2016 Elsevier Interactive Patient Education  2019 [employer].     Follow-Up: At [facility], you and your health needs are our priority.  As part of our continuing mission to provide you with exceptional heart care, we have created designated Provider Care Teams.  These Care Teams include your primary Cardiologist (physician) and Advanced Practice Providers (APPs -  Physician Assistants and Nurse Practitioners) who all work together to provide you with the care you need, when you need it. You will need a follow up appointment in 2 days.

## 2019-02-04 NOTE — Telephone Encounter (Signed)
Pt advised that she would like to see an allergist with [facility] instead of [facility]. Dr. Delorse Lek may route you a note related to this as well. Thanks.

## 2019-02-04 NOTE — Telephone Encounter (Signed)
-----   Message from Baldo Daub, MD sent at 02/04/2019  3:45 PM EST ----- Normal or stable result  Please call her this is normal she is concerned

## 2019-02-04 NOTE — Telephone Encounter (Signed)
Called patient to relay results mailbox is full. DPR states no one else is allowed to receive medical updates on patient.

## 2019-02-04 NOTE — Addendum Note (Signed)
Addended by: LOCKHART, CATHERINE P on: 02/04/2019 03:22 PM   Modules accepted: Orders  

## 2019-02-04 NOTE — Patient Instructions (Addendum)
Allergic reaction    - Continue medications below:                A.  Flonase 1-2 sprays each nostril daily for nasal congestion/drainage               B.  Flovent 110 -2 inhalations daily as needed for cough, wheeze or shortness of breath               C.  Zyrtec 10mg  at bedtime and as needed during the day               D.  Claritin 10 mg as needed (prefer Zyrtec)               E.  Famotidine 20 mg  as needed    F.  Hydroxyzine 10 mg - 1-2 tablets up to 3 times a day as needed for allergic reaction  - Continue to keep track of reactions and note any triggers.  - Xolair monthly injections started to help decrease allergic reaction episodes as well as provide itch control with reactions   -Will plan to start allergen immunotherapy once we have been on Xolair for at least 3-4 months.   -We have also discussed food immunotherapy previously however would need to be at maintenance of her allergen immunotherapy before we will consider starting on food immunotherapy. -Will refer to [facility] for second opinion regarding these episodes - Will refer to GI for evaluation to rule-out EOE.  I am concerned with her continued episodes of dysphagia and throat tightness that it could be GI in nature and not true IgE mediated food reactions as she is having symptoms after ingestion of foods she has been tolerating.    Follow-up 3 months

## 2019-02-04 NOTE — Telephone Encounter (Signed)
-----   Message from Brian J Munley, MD sent at 02/04/2019  3:45 PM EST ----- Normal or stable result  Please call her this is normal she is concerned 

## 2019-02-06 ENCOUNTER — Telehealth: Payer: Self-pay

## 2019-02-06 ENCOUNTER — Ambulatory Visit: Payer: No Typology Code available for payment source | Admitting: Cardiology

## 2019-02-06 NOTE — Telephone Encounter (Signed)
Perfect

## 2019-02-06 NOTE — Telephone Encounter (Signed)
I have also sent her a long detailed message via MyChart.

## 2019-02-06 NOTE — Telephone Encounter (Signed)
Relayed information that Dr. Dulce Sellar wants patient to take hydroxyzine at 0930 to evaluate effects. Patient in agreement.

## 2019-02-06 NOTE — Telephone Encounter (Signed)
Referral placed as urgent to [facility]. Patients has contacted me twice today. Will work on her [facility] Referral next. They do not allow me to schedule as I have told the patient this. They will review her note and contact the patient.  Thanks

## 2019-02-06 NOTE — Telephone Encounter (Signed)
All of her notes in the past month with labs back to 2018 have been sent to [facility]. Fax# [phone]  [address] [place], [place] [address]  Adult Appointments Phone: [phone]   I placed the referral with Dr Renee Harder, she had really good ratings.   Thanks

## 2019-02-06 NOTE — Telephone Encounter (Signed)
Ok thanks [place] for you work on this.

## 2019-02-09 ENCOUNTER — Encounter: Payer: Self-pay | Admitting: Gastroenterology

## 2019-02-09 ENCOUNTER — Ambulatory Visit (INDEPENDENT_AMBULATORY_CARE_PROVIDER_SITE_OTHER): Payer: No Typology Code available for payment source | Admitting: Gastroenterology

## 2019-02-09 VITALS — BP 118/74 | HR 89 | Ht 62.0 in | Wt 136.0 lb

## 2019-02-09 DIAGNOSIS — R0989 Other specified symptoms and signs involving the circulatory and respiratory systems: Secondary | ICD-10-CM | POA: Diagnosis not present

## 2019-02-09 NOTE — Patient Instructions (Signed)
You have been scheduled for an endoscopy. Please follow written instructions given to you at your visit today. If you use inhalers (even only as needed), please bring them with you on the day of your procedure.   Continue Pepcid and Omeprazole   Gastroesophageal Reflux Disease, Adult Gastroesophageal reflux (GER) happens when acid from the stomach flows up into the tube that connects the mouth and the stomach (esophagus). Normally, food travels down the esophagus and stays in the stomach to be digested. However, when a person has GER, food and stomach acid sometimes move back up into the esophagus. If this becomes a more serious problem, the person may be diagnosed with a disease called gastroesophageal reflux disease (GERD). GERD occurs when the reflux:  Happens often.  Causes frequent or severe symptoms.  Causes problems such as damage to the esophagus. When stomach acid comes in contact with the esophagus, the acid may cause soreness (inflammation) in the esophagus. Over time, GERD may create small holes (ulcers) in the lining of the esophagus. What are the causes? This condition is caused by a problem with the muscle between the esophagus and the stomach (lower esophageal sphincter, or LES). Normally, the LES muscle closes after food passes through the esophagus to the stomach. When the LES is weakened or abnormal, it does not close properly, and that allows food and stomach acid to go back up into the esophagus. The LES can be weakened by certain dietary substances, medicines, and medical conditions, including:  Tobacco use.  Pregnancy.  Having a hiatal hernia.  Alcohol use.  Certain foods and beverages, such as coffee, chocolate, onions, and peppermint. What increases the risk? You are more likely to develop this condition if you:  Have an increased body weight.  Have a connective tissue disorder.  Use NSAID medicines. What are the signs or symptoms? Symptoms of this  condition include:  Heartburn.  Difficult or painful swallowing.  The feeling of having a lump in the throat.  Abitter taste in the mouth.  Bad breath.  Having a large amount of saliva.  Having an upset or bloated stomach.  Belching.  Chest pain. Different conditions can cause chest pain. Make sure you see your health care provider if you experience chest pain.  Shortness of breath or wheezing.  Ongoing (chronic) cough or a night-time cough.  Wearing away of tooth enamel.  Weight loss. How is this diagnosed? Your health care provider will take a medical history and perform a physical exam. To determine if you have mild or severe GERD, your health care provider may also monitor how you respond to treatment. You may also have tests, including:  A test to examine your stomach and esophagus with a small camera (endoscopy).  A test thatmeasures the acidity level in your esophagus.  A test thatmeasures how much pressure is on your esophagus.  A barium swallow or modified barium swallow test to show the shape, size, and functioning of your esophagus. How is this treated? The goal of treatment is to help relieve your symptoms and to prevent complications. Treatment for this condition may vary depending on how severe your symptoms are. Your health care provider may recommend:  Changes to your diet.  Medicine.  Surgery. Follow these instructions at home: Eating and drinking   Follow a diet as recommended by your health care provider. This may involve avoiding foods and drinks such as: ? Coffee and tea (with or without caffeine). ? Drinks that containalcohol. ? Energy drinks and  sports drinks. ? Carbonated drinks or sodas. ? Chocolate and cocoa. ? Peppermint and mint flavorings. ? Garlic and onions. ? Horseradish. ? Spicy and acidic foods, including peppers, chili powder, curry powder, vinegar, hot sauces, and barbecue sauce. ? Citrus fruit juices and citrus  fruits, such as oranges, lemons, and limes. ? Tomato-based foods, such as red sauce, chili, salsa, and pizza with red sauce. ? Fried and fatty foods, such as donuts, french fries, potato chips, and high-fat dressings. ? High-fat meats, such as hot dogs and fatty cuts of red and white meats, such as rib eye steak, sausage, ham, and bacon. ? High-fat dairy items, such as whole milk, butter, and cream cheese.  Eat small, frequent meals instead of large meals.  Avoid drinking large amounts of liquid with your meals.  Avoid eating meals during the 2-3 hours before bedtime.  Avoid lying down right after you eat.  Do not exercise right after you eat. Lifestyle   Do not use any products that contain nicotine or tobacco, such as cigarettes, e-cigarettes, and chewing tobacco. If you need help quitting, ask your health care provider.  Try to reduce your stress by using methods such as yoga or meditation. If you need help reducing stress, ask your health care provider.  If you are overweight, reduce your weight to an amount that is healthy for you. Ask your health care provider for guidance about a safe weight loss goal. General instructions  Pay attention to any changes in your symptoms.  Take over-the-counter and prescription medicines only as told by your health care provider. Do not take aspirin, ibuprofen, or other NSAIDs unless your health care provider told you to do so.  Wear loose-fitting clothing. Do not wear anything tight around your waist that causes pressure on your abdomen.  Raise (elevate) the head of your bed about 6 inches (15 cm).  Avoid bending over if this makes your symptoms worse.  Keep all follow-up visits as told by your health care provider. This is important. Contact a health care provider if:  You have: ? New symptoms. ? Unexplained weight loss. ? Difficulty swallowing or it hurts to swallow. ? Wheezing or a persistent cough. ? A hoarse voice.  Your  symptoms do not improve with treatment. Get help right away if you:  Have pain in your arms, neck, jaw, teeth, or back.  Feel sweaty, dizzy, or light-headed.  Have chest pain or shortness of breath.  Vomit and your vomit looks like blood or coffee grounds.  Faint.  Have stool that is bloody or black.  Cannot swallow, drink, or eat. Summary  Gastroesophageal reflux happens when acid from the stomach flows up into the esophagus. GERD is a disease in which the reflux happens often, causes frequent or severe symptoms, or causes problems such as damage to the esophagus.  Treatment for this condition may vary depending on how severe your symptoms are. Your health care provider may recommend diet and lifestyle changes, medicine, or surgery.  Contact a health care provider if you have new or worsening symptoms.  Take over-the-counter and prescription medicines only as told by your health care provider. Do not take aspirin, ibuprofen, or other NSAIDs unless your health care provider told you to do so.  Keep all follow-up visits as told by your health care provider. This is important. This information is not intended to replace advice given to you by your health care provider. Make sure you discuss any questions you have with your health  care provider. Document Released: 09/05/2005 Document Revised: 06/04/2018 Document Reviewed: 06/04/2018 Elsevier Interactive Patient Education  2019 [employer].   Food Choices for Gastroesophageal Reflux Disease, Adult When you have gastroesophageal reflux disease (GERD), the foods you eat and your eating habits are very important. Choosing the right foods can help ease your discomfort. Think about working with a nutrition specialist (dietitian) to help you make good choices. What are tips for following this plan?  Meals  Choose healthy foods that are low in fat, such as fruits, vegetables, whole grains, low-fat dairy products, and lean meat, fish, and  poultry.  Eat small meals often instead of 3 large meals a day. Eat your meals slowly, and in a place where you are relaxed. Avoid bending over or lying down until 2-3 hours after eating.  Avoid eating meals 2-3 hours before bed.  Avoid drinking a lot of liquid with meals.  Cook foods using methods other than frying. Bake, grill, or broil food instead.  Avoid or limit: ? Chocolate. ? Peppermint or spearmint. ? Alcohol. ? Pepper. ? Black and decaffeinated coffee. ? Black and decaffeinated tea. ? Bubbly (carbonated) soft drinks. ? Caffeinated energy drinks and soft drinks.  Limit high-fat foods such as: ? Fatty meat or fried foods. ? Whole milk, cream, butter, or ice cream. ? Nuts and nut butters. ? Pastries, donuts, and sweets made with butter or shortening.  Avoid foods that cause symptoms. These foods may be different for everyone. Common foods that cause symptoms include: ? Tomatoes. ? Oranges, lemons, and limes. ? Peppers. ? Spicy food. ? Onions and garlic. ? Vinegar. Lifestyle  Maintain a healthy weight. Ask your doctor what weight is healthy for you. If you need to lose weight, work with your doctor to do so safely.  Exercise for at least 30 minutes for 5 or more days each week, or as told by your doctor.  Wear loose-fitting clothes.  Do not smoke. If you need help quitting, ask your doctor.  Sleep with the head of your bed higher than your feet. Use a wedge under the mattress or blocks under the bed frame to raise the head of the bed. Summary  When you have gastroesophageal reflux disease (GERD), food and lifestyle choices are very important in easing your symptoms.  Eat small meals often instead of 3 large meals a day. Eat your meals slowly, and in a place where you are relaxed.  Limit high-fat foods such as fatty meat or fried foods.  Avoid bending over or lying down until 2-3 hours after eating.  Avoid peppermint and spearmint, caffeine, alcohol, and  chocolate. This information is not intended to replace advice given to you by your health care provider. Make sure you discuss any questions you have with your health care provider. Document Released: 05/27/2012 Document Revised: 01/01/2017 Document Reviewed: 01/01/2017 Elsevier Interactive Patient Education  [employer].  If you are age 73 or older, your body mass index should be between 23-30. Your Body mass index is 24.87 kg/m. If this is out of the aforementioned range listed, please consider follow up with your Primary Care Provider.  If you are age 40 or younger, your body mass index should be between 19-25. Your Body mass index is 24.87 kg/m. If this is out of the aformentioned range listed, please consider follow up with your Primary Care Provider.

## 2019-02-09 NOTE — Progress Notes (Signed)
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 She is unaccompanied. She works for [employer]. She has had worsening problems with allergies, reactions to several foods over the past 3 years.  Her symptoms have substantially worsened over the past few months with several ED visits. She is being actively treated for allergic reactions and seasonal allergies. She has intermittent throat tightness, globus senation and difficulty swallowing. These symptoms typically are accompanied by facial, throat and tongue swelling. ENT evaluation by Dr. Jenne Pane on February 6 felt it was globus sensation.  She relates occasional heartburn.  She has been treated with famotidine both for allergies and GERD. She does not feel her occasional heartburn has been helped by famotidine.  She localizes her difficulty swallowing to her neck, throat and not her chest.  She relates a 12 pound weight loss over the past few months as she is afraid to eat due to concerns about triggering a reaction from multiple foods. Weight on 10/21/2018 was 148 and today is 136. She was started on omeprazole 3 days ago.  Evaluation by Dr. Dulce Sellar, Cardiology, on February 26 felt chest pain/tightness was related to allergic reactions and an echo is planned. Denies abdominal pain, constipation, diarrhea, change in stool caliber, melena, hematochezia, nausea, vomiting, chest pain.     Allergies  Allergen Reactions  . Benadryl [Diphenhydramine]     Chest Pain  . Other     Tree nuts   . Peanut-Containing Drug Products Anaphylaxis  . Corn-Containing Products   . Soy Allergy   . Tobramycin Other (See Comments)    Caused dizziness  . Ketoconazole Rash   Outpatient Medications Prior to Visit  Medication Sig Dispense Refill  . albuterol (PROAIR HFA) 108 (90 Base) MCG/ACT inhaler Inhale 2 puffs into the lungs  every 6 (six) hours as needed for wheezing or shortness of breath.    . diphenhydrAMINE (BENADRYL) 25 mg capsule Take 1-2 capsules (25-50 mg total) by mouth every 6 (six) hours as needed. 60 capsule 1  . EPINEPHrine 0.3 mg/0.3 mL IJ SOAJ injection Inject 0.3 mLs (0.3 mg total) into the muscle as needed for anaphylaxis. 1 Device 2  . famotidine (PEPCID) 40 MG tablet Take 1 tablet (40 mg total) by mouth 2 (two) times daily. 60 tablet 5  . fluticasone (FLONASE) 50 MCG/ACT nasal spray Place 1 spray into both nostrils as needed for allergies or rhinitis. 16 g 5  . hydrOXYzine (ATARAX) 10 MG/5ML syrup Take 5 mLs (10 mg total) by mouth 3 (three) times daily as needed. 240 mL 0  . loratadine (CLARITIN) 10 MG tablet Take 1 tablet (10 mg total) by mouth daily. 30 tablet 5  . omalizumab (XOLAIR) 150 MG injection Inject 300 mg into the skin every 28 (twenty-eight) days.    Marland Kitchen omeprazole (PRILOSEC) 20 MG capsule Take 20 mg by mouth daily.    . ondansetron (ZOFRAN ODT) 8 MG disintegrating tablet Take 1 tablet (8 mg total) by mouth every 8 (eight) hours as needed for nausea or vomiting. 10 tablet 0  . ondansetron (ZOFRAN-ODT) 4 MG disintegrating tablet Take 4 mg by mouth every 8 (eight) hours as needed for nausea or vomiting (headaches).      Facility-Administered Medications Prior to Visit  Medication Dose Route Frequency Provider Last Rate Last Dose  . omalizumab Geoffry Paradise) injection 300 mg  300 mg Subcutaneous Q28  days Marcelyn Bruins, MD   300 mg at 01/27/19 1020   Past Medical History:  Diagnosis Date  . ADHD   . Angio-edema   . Asthma   . UTI (urinary tract infection)    hx of freq in 2018   Past Surgical History:  Procedure Laterality Date  . LEEP  2010  . WISDOM TOOTH EXTRACTION  2008   Social History   Socioeconomic History  . Marital status: Married    Spouse name: Not on file  . Number of children: 2  . Years of education: doctorate DNP  . Highest education level: Not on file    Occupational History    Comment: [employer], NP  Social Needs  . Financial resource strain: Not on file  . Food insecurity:    Worry: Not on file    Inability: Not on file  . Transportation needs:    Medical: Not on file    Non-medical: Not on file  Tobacco Use  . Smoking status: Never Smoker  . Smokeless tobacco: Never Used  Substance and Sexual Activity  . Alcohol use: Yes    Comment: 1-2 glasses wine/weekly  . Drug use: No  . Sexual activity: Not on file  Lifestyle  . Physical activity:    Days per week: Not on file    Minutes per session: Not on file  . Stress: Not on file  Relationships  . Social connections:    Talks on phone: Not on file    Gets together: Not on file    Attends religious service: Not on file    Active member of club or organization: Not on file    Attends meetings of clubs or organizations: Not on file    Relationship status: Not on file  Other Topics Concern  . Not on file  Social History Narrative   Lives with husband, children.  Works at [employer].  Education: doctorate level.    Tea/coffee 2-5 x week   Family History  Problem Relation Age of Onset  . Diabetes Mother   . Hyperthyroidism Mother   . Diabetes Father        type 2  . Heart disease Father   . Cancer Father        prostate  . Eczema Sister   . Diabetes Maternal Grandmother        type 2  . Allergic rhinitis Neg Hx   . Asthma Neg Hx   . Urticaria Neg Hx        Review of Systems: Pertinent positive and negative review of systems were noted in the above HPI section. All other review of systems were otherwise negative.   Physical Exam: General: Well developed, well nourished, no acute distress Head: Normocephalic and atraumatic Eyes:  sclerae anicteric, EOMI Ears: Normal auditory acuity Mouth: No deformity or lesions Neck: Supple, no masses or thyromegaly Lungs: Clear throughout to auscultation Heart: Regular rate and rhythm; no murmurs, rubs or bruits Abdomen:  Soft, non tender and non distended. No masses, hepatosplenomegaly or hernias noted. Normal Bowel sounds Rectal: Not done Musculoskeletal: Symmetrical with no gross deformities  Skin: No lesions on visible extremities Pulses:  Normal pulses noted Extremities: No clubbing, cyanosis, edema or deformities noted Neurological: Alert oriented x 4, grossly nonfocal Cervical Nodes:  No significant cervical adenopathy Inguinal Nodes: No significant inguinal adenopathy Psychological:  Alert and cooperative. Anxious.   Assessment and Recommendations:  1.  Globus sensation, multiple allergic reactions, possible GERD, possible  dysphagia . Her swallowing symptoms are typical for globus sensation and not for esophageal dysphagia however esophagitis, esophageal stricture, eosinophilic esophagitis, etc. should be excluded.    Follow standard antireflux measures.   Continue omeprazole 20 mg qam and famotidine 40 mg qpm.   Continue close follow up with her allergist.  Referral to [facility] is planned by her allergist.  Discussed EGD with possible biopsy, possible dilation and barium esophagram with a tablet to further evaluate her symptoms.  She relates a severe allergy to soy and is concerned about propofol sedation. Offered EGD with fentanyl and Versed sedation and she agrees to proceed. She declines barium esophagram for now. The risks (including bleeding, perforation, infection, missed lesions, medication reactions and possible hospitalization or surgery if complications occur), benefits, and alternatives to endoscopy with possible biopsy and possible dilation were discussed with the patient and they consent to proceed.    cc: Marcelyn Bruins, MD [address] [place], [place] [address]

## 2019-02-11 ENCOUNTER — Telehealth: Payer: Self-pay | Admitting: *Deleted

## 2019-02-11 ENCOUNTER — Ambulatory Visit (AMBULATORY_SURGERY_CENTER): Payer: No Typology Code available for payment source | Admitting: Gastroenterology

## 2019-02-11 ENCOUNTER — Encounter: Payer: Self-pay | Admitting: Gastroenterology

## 2019-02-11 VITALS — BP 133/89 | HR 91 | Temp 99.1°F | Resp 12 | Ht 62.0 in | Wt 133.0 lb

## 2019-02-11 DIAGNOSIS — R0989 Other specified symptoms and signs involving the circulatory and respiratory systems: Secondary | ICD-10-CM

## 2019-02-11 DIAGNOSIS — R1310 Dysphagia, unspecified: Secondary | ICD-10-CM | POA: Diagnosis not present

## 2019-02-11 MED ORDER — SODIUM CHLORIDE 0.9 % IV SOLN: 500.0000 mL | Freq: Once | INTRAVENOUS | Status: DC

## 2019-02-11 NOTE — Progress Notes (Signed)
Called to room to assist during endoscopic procedure.  Patient ID and intended procedure confirmed with present staff. Received instructions for my participation in the procedure from the performing physician.  

## 2019-02-11 NOTE — Telephone Encounter (Signed)
Patient called this am to the office to speak with [facility] about a procedure she is having a procedure this am..

## 2019-02-11 NOTE — Telephone Encounter (Signed)
Spoke with Dr. Delorse Lek and advise patient via phone when patient called and advised patient per Dr. Delorse Lek request that if GI is okay with alternative she is in agreeable with that since patient has allergies to soy.

## 2019-02-11 NOTE — Progress Notes (Signed)
Pt's states no medical or surgical changes since previsit or office visit. 

## 2019-02-11 NOTE — Telephone Encounter (Signed)
FMLA forms completed and faxed to [employer] [phone]. Forms placed to be scanned. Could not call patient due to her having a procedure today. Can someone call patient on Friday to advised forms were faxed and she can pick up copy (placed up front) also there is a $25 charge for forms.

## 2019-02-11 NOTE — Progress Notes (Signed)
PT taken to PACU. Monitors in place. VSS. Report given to RN. 

## 2019-02-11 NOTE — Op Note (Signed)
[facility] Patient Name: [name] [name] Procedure Date: 02/11/2019 3:35 PM MRN: [id number] Endoscopist: Meryl Dare , MD Age: 33 Referring MD:  Date of Birth: March 02, 1986 Gender: Female Account #: [id number] Procedure:                Upper GI endoscopy Indications:              Suspected gastroesophageal reflux disease,                            Dysphagia, Globus sensation Medicines:                Fentanyl 100 micrograms IV, Midazolam 7 mg IV Procedure:                Pre-Anesthesia Assessment:                           - Prior to the procedure, a History and Physical                            was performed, and patient medications and                            allergies were reviewed. The patient's tolerance of                            previous anesthesia was also reviewed. The risks                            and benefits of the procedure and the sedation                            options and risks were discussed with the patient.                            All questions were answered, and informed consent                            was obtained. Prior Anticoagulants: The patient has                            taken no previous anticoagulant or antiplatelet                            agents. ASA Grade Assessment: II - A patient with                            mild systemic disease. After reviewing the risks                            and benefits, the patient was deemed in                            satisfactory condition to undergo the procedure.  After obtaining informed consent, the endoscope was                            passed under direct vision. Throughout the                            procedure, the patient's blood pressure, pulse, and                            oxygen saturations were monitored continuously. The                            Endoscope was introduced through the mouth, and                            advanced to the  second part of duodenum. The upper                            GI endoscopy was accomplished without difficulty.                            The patient tolerated the procedure well. Scope In: Scope Out: Findings:                 Localized mild erythema was found in the distal                            esophagus with z-line at 35 m. No finding to                            explain dysphagia. Biopsies were taken with a cold                            forceps for histology.                           The exam of the esophagus was otherwise normal. No                            findings to explain dysphagia. Biopsies were taken                            with a cold forceps for histology.                           The entire examined stomach was normal.                           The duodenal bulb and second portion of the                            duodenum were normal. Complications:            No immediate complications. Estimated Blood Loss:     Estimated blood loss was minimal. Impression:               -  Erythema in the distal esophagus. Biopsied.                           - Normal stomach.                           - Normal duodenal bulb and second portion of the                            duodenum. Recommendation:           - Patient has a contact number available for                            emergencies. The signs and symptoms of potential                            delayed complications were discussed with the                            patient. Return to normal activities tomorrow.                            Written discharge instructions were provided to the                            patient.                           - Resume previous diet.                           - Continue present medications.                           - Await pathology results. Meryl Dare, MD 02/11/2019 4:01:24 PM This report has been signed electronically.

## 2019-02-11 NOTE — Patient Instructions (Signed)
   Thank you for allowing [place] to care for you today!  Await pathology results by mail, approximately 2 weeks.    Resume previous diet and medications.  Return to your normal activities tomorrow.     YOU HAD AN ENDOSCOPIC PROCEDURE TODAY AT THE [facility]:   Refer to the procedure report that was given to you for any specific questions about what was found during the examination.  If the procedure report does not answer your questions, please call your gastroenterologist to clarify.  If you requested that your care partner not be given the details of your procedure findings, then the procedure report has been included in a sealed envelope for you to review at your convenience later.  YOU SHOULD EXPECT: Some feelings of bloating in the abdomen. Passage of more gas than usual.  Walking can help get rid of the air that was put into your GI tract during the procedure and reduce the bloating. If you had a lower endoscopy (such as a colonoscopy or flexible sigmoidoscopy) you may notice spotting of blood in your stool or on the toilet paper. If you underwent a bowel prep for your procedure, you may not have a normal bowel movement for a few days.  Please Note:  You might notice some irritation and congestion in your nose or some drainage.  This is from the oxygen used during your procedure.  There is no need for concern and it should clear up in a day or so.  SYMPTOMS TO REPORT IMMEDIATELY:    Following upper endoscopy (EGD)  Vomiting of blood or coffee ground material  New chest pain or pain under the shoulder blades  Painful or persistently difficult swallowing  New shortness of breath  Fever of 100F or higher  Black, tarry-looking stools  For urgent or emergent issues, a gastroenterologist can be reached at any hour by calling [phone].   DIET:  We do recommend a small meal at first, but then you may proceed to your regular diet.  Drink plenty of fluids but you should  avoid alcoholic beverages for 24 hours.  ACTIVITY:  You should plan to take it easy for the rest of today and you should NOT DRIVE or use heavy machinery until tomorrow (because of the sedation medicines used during the test).    FOLLOW UP: Our staff will call the number listed on your records the next business day following your procedure to check on you and address any questions or concerns that you may have regarding the information given to you following your procedure. If we do not reach you, we will leave a message.  However, if you are feeling well and you are not experiencing any problems, there is no need to return our call.  We will assume that you have returned to your regular daily activities without incident.  If any biopsies were taken you will be contacted by phone or by letter within the next 1-3 weeks.  Please call us at [phone] if you have not heard about the biopsies in 3 weeks.    SIGNATURES/CONFIDENTIALITY: You and/or your care partner have signed paperwork which will be entered into your electronic medical record.  These signatures attest to the fact that that the information above on your After Visit Summary has been reviewed and is understood.  Full responsibility of the confidentiality of this discharge information lies with you and/or your care-partner.

## 2019-02-12 ENCOUNTER — Telehealth: Payer: Self-pay | Admitting: *Deleted

## 2019-02-12 NOTE — Telephone Encounter (Signed)
Second attempt, left VM.  

## 2019-02-12 NOTE — Telephone Encounter (Signed)
No answer, number identifier. Message left to call if questions or concerns and we will attempt to call a little later in the day.

## 2019-02-13 NOTE — Telephone Encounter (Signed)
Patient informed that forms are ready to be picked up at her convenience. I did let her know that there would be a 25.00 charge for the completion of those forms.

## 2019-02-15 ENCOUNTER — Telehealth: Payer: Self-pay | Admitting: Allergy

## 2019-02-15 NOTE — Telephone Encounter (Signed)
Called by pt due to having increased SOB.  She states she had EGD Wed and feels like she has been having more GI distress.  She went to work Thursday at the [employer] and had a pt come in who had been in [place] with concern for possible Coronavirus.  States pt was having flu like symptoms and was flu swab neg.  Friday she developed body aches and chills. Saturday she states she had a spell of SOB.  She states her husband cleaned the house and there was a lot of dust however this would not explain body aches/chills.  She took Singulair Saturday night and prednisone 55m.  Today she is reporting more SOB and chest tightness.  She she did use albuterol which she had temporary relief.    She did call the health department today and actually had them call back while I was on the phone with her and she reports that they told her the epidemiologist is going to be calling her for further next steps.  I advised that her symptoms currently did not warrant an ED visit (pt talking in full sentences and does not appear to be in distress) and she should remain home at this time until further notice from epidemiologist.  She states her husband was getting her a flu test kit.     I advised that she do albuterol q4hr while awake today and to take prednisone 120mBID today and tomorrow.   Also advised she use the flovent but she states that it makes her feel worse when she has used it in the past.   She will continue supportive care measures.    She also requested a CXR which I do not feel is warranted today however if symptoms persist into tomorrow (and after instructions provided by epidemiologist as to not further spread potential exposure) please order CXR 2 view for SOB and chest tightness.  Place order for tomorrow Mon 3/9.  I did advise she should be appropriately masked if leaving the home.    She also wanted to move up her appt with me from Thursday to Wednesday of this week.     Someone pllease move her to the  Wed 3/11 in the 10:40 slot.    She also feels like she has been having more respiratory issues since starting Xolair.  She has had 1 dose.  I advised we can skip the March dose and see if she feels improved or not without it.     **whoever calls her back Monday 3/9 to confirm appt move and advise where to have CXR done (if still symptomatic) please ASK WHAT SHE WAS ADVISED TO DO REGARDING ?CORONAVIRUS EXPOSURE FROM THE EPIDEMIOLOGIST.   Her symptoms currently are manageable at home at this time.  Unless this changes then I would not recommend she come into office unless we know she either has been cleared from the health dept and does not need to be quarantined.**

## 2019-02-16 NOTE — Telephone Encounter (Signed)
Appointment moved to instructed date/time.

## 2019-02-16 NOTE — Telephone Encounter (Signed)
I spoke with patient this morning. She informed me that the epidemiologist did not recommend further testing or quarantine measures. She says that the shortness of breath and chest tightness have gotten better with the prednisone and scheduled albuterol use. Body aches and chills stopped yesterday. She denied fever. She does not feel great, but is better. She informed me that she will continue albuterol and prednisone as instructed. Appointment time/date change has been confirmed and acknowledged by patient. Patient informed me that she bought a shake that she wants to challenge to on Wednesday. I told her that unfortunately we do not have her scheduled for this type of visit. We only have her scheduled for a 20 minute visit (follow up). She acknowledged understanding of this information. Patient expressed concern about her only being able to consume 5 foods. I let her know that I would send a message to Dr. Delorse Lek expressing concerns about this. She told me that she would just try the shake at home. I advised that if she was going to do this at home she read the ingredient list thoroughly as to compare to her food allergies. Epi and benadryl should be kept on hand as well. Dr. Delorse Lek please advise on further instructions and thank you.

## 2019-02-17 NOTE — Telephone Encounter (Signed)
Thanks Kayla.  Agree with what you recommended to patient.

## 2019-02-18 ENCOUNTER — Other Ambulatory Visit: Payer: Self-pay

## 2019-02-18 ENCOUNTER — Encounter: Payer: Self-pay | Admitting: Allergy

## 2019-02-18 ENCOUNTER — Ambulatory Visit (INDEPENDENT_AMBULATORY_CARE_PROVIDER_SITE_OTHER): Payer: No Typology Code available for payment source | Admitting: Allergy

## 2019-02-18 VITALS — BP 110/78 | HR 80 | Resp 18

## 2019-02-18 DIAGNOSIS — T7840XD Allergy, unspecified, subsequent encounter: Secondary | ICD-10-CM

## 2019-02-18 MED ORDER — CETIRIZINE HCL 10 MG PO TABS: 10.0000 mg | ORAL_TABLET | Freq: Every day | ORAL | 3 refills | Status: DC

## 2019-02-18 MED ORDER — FLUTICASONE PROPIONATE HFA 110 MCG/ACT IN AERO: 2.0000 | INHALATION_SPRAY | Freq: Two times a day (BID) | RESPIRATORY_TRACT | 12 refills | Status: DC | PRN

## 2019-02-18 NOTE — Progress Notes (Signed)
Follow-up Note  RE: [name] [name] MRN: [id number] DOB: [birth year] Date of Office Visit: 02/18/2019   History of present illness: [name] [name] is a 33 y.o. female presenting today for follow-up of allergic reactions.  She was last seen in the office on 02/04/2019 by myself.  Over the past weekend she developed    Late last week she states she had increased SOB.  She  had EGD with GI last Wed and feels like she has been having more GI distress since then.  She went to work Thursday at the [employer] and had a pt come in who had been in [place] with concern for possible Coronavirus (she states [facility] recommend pt self-quarantine but has not been tested).  She states pt was having flu like symptoms and was flu swab neg.  Friday she developed body aches and chills. Saturday she states she had a spell of SOB.  She states her husband cleaned the house and there was a lot of dust.  She took Singulair Saturday night and prednisone 10mg .  Sunday she reported more SOB and chest tightness.  She she did use albuterol which she had temporary relief.  She called on-call allergist which happened to be me and I advised that she take albuterol 2 puffs every 4 hours and prednisone 10mg  BID for next 2 days which she did.  She did call the health department for her self and states that advised that she didn't need to quarantine or warrant testing.   Today she states she is feeling better.  She does feel like the singulair helps with both the respiratory and allergies but she feels it is causing palpitations at night.  She would like to perform holter monitoring with cardiology.  She stated she didn't want to stop singulair as she does have improvement with use.  She also states she is willing to try the Flovent again.  She states she has gain 2lbs this weekend and is very happy about this.   She has not had any reactions in the past 3 days she reports she is also happy about.   She states she is going to try a new protein  shake.  She also wants to try to eat more fruits and vegetables but wants to make sure she has not IgE signal before she feels comfortable resuming in diet.   She also states she feels she has had more respiratory symptoms since getting the dose of Xolair in February.  She has been taking zyrtec daily and flonase daily to help with environmental allergies.    Review of systems: Review of Systems  Constitutional: Positive for weight loss. Negative for chills, fever and malaise/fatigue.  HENT: Positive for congestion. Negative for ear discharge, nosebleeds and sore throat.   Eyes: Negative for pain, discharge and redness.  Respiratory: Positive for cough and shortness of breath.   Cardiovascular: Negative for chest pain.  Gastrointestinal: Negative for abdominal pain, constipation, diarrhea, heartburn, nausea and vomiting.  Musculoskeletal: Negative for joint pain.  Skin: Negative for itching and rash.  Neurological: Negative for headaches.    All other systems negative unless noted above in HPI  Past medical/social/surgical/family history have been reviewed and are unchanged unless specifically indicated below.  No changes  Medication List: Allergies as of 02/18/2019      Reactions   Benadryl [diphenhydramine]    Chest Pain   Other    Tree nuts    Peanut-containing Drug Products Anaphylaxis   Corn-containing Products  Soy Allergy    Tobramycin Other (See Comments)   Caused dizziness   Ketoconazole Rash      Medication List       Accurate as of February 18, 2019  3:58 PM. Always use your most recent med list.        cetirizine 10 MG tablet Commonly known as:  ZyrTEC Allergy Take 1 tablet (10 mg total) by mouth daily.   diphenhydrAMINE 25 mg capsule Commonly known as:  BENADRYL Take 1-2 capsules (25-50 mg total) by mouth every 6 (six) hours as needed.   EPINEPHrine 0.3 mg/0.3 mL Soaj injection Commonly known as:  EPI-PEN Inject 0.3 mLs (0.3 mg total) into the muscle  as needed for anaphylaxis.   famotidine 40 MG tablet Commonly known as:  PEPCID Take 1 tablet (40 mg total) by mouth 2 (two) times daily.   fluticasone 110 MCG/ACT inhaler Commonly known as:  Flovent HFA Inhale 2 puffs into the lungs 2 (two) times daily as needed.   fluticasone 50 MCG/ACT nasal spray Commonly known as:  FLONASE Place 1 spray into both nostrils as needed for allergies or rhinitis.   hydrOXYzine 10 MG/5ML syrup Commonly known as:  ATARAX Take 5 mLs (10 mg total) by mouth 3 (three) times daily as needed.   loratadine 10 MG tablet Commonly known as:  CLARITIN Take 1 tablet (10 mg total) by mouth daily.   omalizumab 150 MG injection Commonly known as:  XOLAIR Inject 300 mg into the skin every 28 (twenty-eight) days.   omeprazole 20 MG capsule Commonly known as:  PRILOSEC Take 20 mg by mouth daily.   omeprazole 20 MG tablet Commonly known as:  PRILOSEC OTC Take 20 mg by mouth daily.   ondansetron 8 MG disintegrating tablet Commonly known as:  Zofran ODT Take 1 tablet (8 mg total) by mouth every 8 (eight) hours as needed for nausea or vomiting.   ProAir HFA 108 (90 Base) MCG/ACT inhaler Generic drug:  albuterol Inhale 2 puffs into the lungs every 6 (six) hours as needed for wheezing or shortness of breath.       Known medication allergies: Allergies  Allergen Reactions  . Benadryl [Diphenhydramine]     Chest Pain  . Other     Tree nuts   . Peanut-Containing Drug Products Anaphylaxis  . Corn-Containing Products   . Soy Allergy   . Tobramycin Other (See Comments)    Caused dizziness  . Ketoconazole Rash     Physical examination: Blood pressure 110/78, pulse 80, resp. rate 18, SpO2 98 %.  General: Alert, interactive, in no acute distress. HEENT: PERRLA, TMs pearly gray, turbinates moderately edematous with clear discharge, post-pharynx non erythematous. Neck: Supple without lymphadenopathy. Lungs: Clear to auscultation without wheezing,  rhonchi or rales. {no increased work of breathing. CV: Normal S1, S2 without murmurs. Abdomen: Nondistended, nontender. Skin: Warm and dry, without lesions or rashes. Extremities:  No clubbing, cyanosis or edema. Neuro:   Grossly intact.  Diagnositics/Labs: None today  Assessment and plan:   Allergic reaction   - Continue medications below:                A.    Dymista 1 spray each nostril twice a day.  This is a combination nasal spray with Flonase + Astelin (nasal antihistamine).  This helps with both nasal congestion and drainage.  Hold your Flonase while using Dymista.  Flonase 1-2 sprays each nostril daily for nasal congestion/drainage  B.    Albuterol inhaler 2 puffs every 4 hours as needed for cough, wheeze or shortness of breath      C.  Flovent 2 puffs twice a day as needed               D.  Zyrtec 10mg  at bedtime and as needed during the day               E.  Famotidine 20 mg  as needed      F.  Hydroxyzine 10 mg - 1-2 tablets up to 3 times a day as needed for allergic reaction     G.  Singulair 10mg  at bedtime - check with cardiology regarding palpitations     H. Have access to your Epipen     I. Use prednisone 10mg  -20mg  as needed for allergic reaction management   - will obtain serum IgE to fruit and vegetable panel as well as coconut and chia/sage - Continue to keep track of reactions and note any triggers.  - Xolair monthly injections started to help decrease allergic reaction episodes as well as provide itch control with reactions however plan to hold March dose to determine if improved symptoms off Xolair - Referral placed for Nutrition appointment - Referral placed to [facility] for second opinion regarding reactions - ENT, GI and cardiology evaluations has been reassuring to-date   I appreciate the opportunity to take part in Dennison's care. Please do not hesitate to contact me with questions.  Sincerely,   Margo Aye, MD  Allergy/Immunology [facility]

## 2019-02-18 NOTE — Patient Instructions (Addendum)
Allergic reaction   - Continue medications below:                A.  Dymista 1 spray each nostril twice a day.  This is a combination nasal spray with Flonase + Astelin (nasal antihistamine).  This helps with both nasal congestion and drainage.  Hold your Flonase while using Dymista.  Flonase 1-2 sprays each nostril daily for nasal congestion/drainage               B.  Albuterol inhaler 2 puffs every 4 hours as needed for cough, wheeze or shortness of breath    C.  Flovent 2 puffs twice a day as needed               C.  Zyrtec 10mg  at bedtime and as needed during the day               E.  Famotidine 20 mg  as needed    F.  Hydroxyzine 10 mg - 1-2 tablets up to 3 times a day as needed for allergic reaction    G.  Singulair 10mg  at bedtime - check with cardiology regarding palpitations    H. Have access to your Epipen    I. Use prednisone 10mg  -20mg  as needed for allergic reaction management   - will obtain serum IgE to fruit and vegetable panel as well as coconut and chia/sage - Continue to keep track of reactions and note any triggers.  - Xolair monthly injections started to help decrease allergic reaction episodes as well as provide itch control with reactions however plan to hold March dose to determine if improved symptoms off Xolair - Referral placed for Nutrition appointment - Referral placed to [facility] for second opinion regarding reactions - ENT, GI and cardiology evaluations has been reassuring to-date

## 2019-02-19 ENCOUNTER — Ambulatory Visit: Payer: No Typology Code available for payment source | Admitting: Allergy

## 2019-02-19 ENCOUNTER — Ambulatory Visit (HOSPITAL_BASED_OUTPATIENT_CLINIC_OR_DEPARTMENT_OTHER)
Admission: RE | Admit: 2019-02-19 | Discharge: 2019-02-19 | Disposition: A | Discharge status: Home / Self Care | Payer: No Typology Code available for payment source | Source: Ambulatory Visit | Attending: Cardiology | Admitting: Cardiology

## 2019-02-19 DIAGNOSIS — R079 Chest pain, unspecified: Secondary | ICD-10-CM | POA: Diagnosis present

## 2019-02-19 DIAGNOSIS — Z87440 Personal history of urinary (tract) infections: Secondary | ICD-10-CM | POA: Insufficient documentation

## 2019-02-19 NOTE — Progress Notes (Signed)
  Echocardiogram 2D Echocardiogram has been performed.  [name] [name] [name] 02/19/2019, 1:47 PM

## 2019-02-20 ENCOUNTER — Ambulatory Visit: Payer: No Typology Code available for payment source | Admitting: Dietician

## 2019-02-21 LAB — ALLERGEN PROFILE, VEGETABLE I
Allergen Broccoli: <0.1 kU/L
Allergen Cabbage IgE: <0.1 kU/L
Allergen Cauliflower IgE: <0.1 kU/L
Allergen Celery IgE: <0.1 kU/L
Allergen Lettuce IgE: <0.1 kU/L
F214-IgE Spinach: <0.1 kU/L

## 2019-02-21 LAB — ALLERGEN PROFILE, FOOD-FRUIT
Allergen Apple, IgE: 2.16 kU/L — AB
Allergen Pear IgE: 5.46 kU/L — AB
Allergen, Peach f95: 9.84 kU/L — AB

## 2019-02-21 LAB — ALLERGEN PROFILE, VEGETABLE II
Allergen Carrot IgE: <0.1 kU/L
Allergen Green Bean IgE: <0.1 kU/L
Allergen Green Pea IgE: <0.1 kU/L
Allergen Onion IgE: <0.1 kU/L
Allergen Potato, White IgE: <0.1 kU/L
Allergen Tomato, IgE: <0.1 kU/L
Kidney Bean IgE: <0.1 kU/L
Pumpkin IgE: <0.1 kU/L
SOYBEAN IGE: 0.28 kU/L — AB

## 2019-02-21 LAB — ALLERGEN COCONUT IGE

## 2019-02-21 LAB — ALLERGEN, BLUEBERRY, RF288: Allergen Blueberry IgE: <0.1 kU/L

## 2019-02-21 LAB — F344-IGE SAGE, SALVIA: F344-IgE Sage, Salvia: <0.1 kU/L

## 2019-02-22 ENCOUNTER — Encounter: Payer: Self-pay | Admitting: Gastroenterology

## 2019-03-02 ENCOUNTER — Telehealth: Payer: Self-pay | Admitting: Allergy

## 2019-03-02 NOTE — Telephone Encounter (Signed)
Patient called to let us know she had FMLA pw sent to [place]. She can now only have 4 foods in her diet. Cabbage and sweet potato, that she previously could eat, she can no longer eat. She is on prednisone for reflux. She is also having right upper quadrant pain. Her PCP is working her up for gallstones. 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 Her start date for FMLA will be next week when she is to be at work through May 20th when she goes to [place].

## 2019-03-02 NOTE — Telephone Encounter (Signed)
FYI

## 2019-03-02 NOTE — Telephone Encounter (Signed)
She sent a Mychart mssg that I will respond to.  In regards to her FMLA paperwork we can just copy from what I initially filled out onto this new paperwork.

## 2019-03-03 ENCOUNTER — Other Ambulatory Visit: Payer: Self-pay

## 2019-03-03 ENCOUNTER — Ambulatory Visit
Admission: RE | Admit: 2019-03-03 | Discharge: 2019-03-03 | Disposition: A | Discharge status: Home / Self Care | Payer: No Typology Code available for payment source | Source: Ambulatory Visit | Attending: Family Medicine | Admitting: Family Medicine

## 2019-03-03 ENCOUNTER — Other Ambulatory Visit: Payer: Self-pay | Admitting: Family Medicine

## 2019-03-03 DIAGNOSIS — R1011 Right upper quadrant pain: Secondary | ICD-10-CM

## 2019-03-03 NOTE — Telephone Encounter (Signed)
Ok.... will let her know tomorrow we have not received the new forms.

## 2019-03-03 NOTE — Telephone Encounter (Signed)
Ok thanks [place] for the update.  I also relayed this info to [place] via mychart.

## 2019-03-03 NOTE — Telephone Encounter (Signed)
I still have not seen the FMLA forms for this patient.

## 2019-03-03 NOTE — Telephone Encounter (Signed)
Noted. Thank you. Please let me know if you need anything else.

## 2019-03-03 NOTE — Telephone Encounter (Signed)
Hey Dr Delorse Lek   I called [facility] and was told that I had to leave a message with a nurse to see if they can get her in sooner as they are having to move patients out to may and further per their COVID policy. I have left a message on the nurse line letting her know that the patient is unable to eat anything but two items without having anaphylaxis and that she is losing weight fast.   Thanks   Phone: [phone]

## 2019-03-04 ENCOUNTER — Encounter: Payer: No Typology Code available for payment source | Attending: Allergy | Admitting: Registered"

## 2019-03-04 NOTE — Progress Notes (Unsigned)
Medical Nutrition Therapy:  Appt start time: 2:10 end time:  2:45.  Allergies: peanuts, tree nuts, corn, soy, apple, peach, pear  Assessment:  Primary concerns today: Pt arrives with husband stating she wants a meal plan that can include the food she is able to tolerate. Pt reports wanting to have a virtual/over-the-phone visit due to recent pandemic and preferring not to have in-office visit.   Pt states has only been able to tolerate chicken and green beans lately. Pt states she recently learned she is uanble to tolerate cabbage, sweet potatoes which has been discovered recently.   Pt reports recently losing about 20 lbs in one month and has not been taking a multivitamin in about 18 months. Pt states she is scheduled to have more allergy testing/getting a second opinion in May at [place]. Pt states she is unable to eat carbohydrates-bread, wheat, grits, fruit, potatoes, and corn. Pt states does not like green peas but has tolerated black-eyed peas. Pt states she is allergic to fruit but willing to try certain fruit options such as plantains. Pt states she is also lactose-intolerance.  Pt states she works 8am-8pm, 7 days/week and has not been able to try good options because she wants to be at home just in case she has an allergic reaction. Pt states she will be taking FMLA soon and will have more free time to try food to see what she can tolerate and what she cannot. Pt states she is trying a paleo protein powder (dairy-free, gluten-free) today. Pt states   Has OAS to birch pollen  Preferred Learning Style:   No preference indicated   Learning Readiness:   Ready  Change in progress   MEDICATIONS: See list   DIETARY INTAKE:  Estimated energy needs: 1800-2000 calories 200-225 g carbohydrates 135-150 g protein 50-56 g fat  Progress Towards Goal(s):  In progress.   Nutritional Diagnosis:  NB-1.1 Food and nutrition-related knowledge deficit As related to allergies.  As evidenced by pt  verbalizes incomplete knowledge.    Intervention:  Nutrition education and counseling.  Teaching Method Utilized:  Visual Auditory Hands on  Handouts given during visit include:  none  Barriers to learning/adherence to lifestyle change: work-life balance  Demonstrated degree of understanding via:  Teach Back   Monitoring/Evaluation:  Dietary intake, exercise, and body weight prn.

## 2019-03-04 NOTE — Patient Instructions (Addendum)
-   Continue to try new things slowly. Keep a list.   - Be open.  - Send me email of list of food items tried that cause allergic reactions.

## 2019-03-06 ENCOUNTER — Ambulatory Visit: Payer: Self-pay | Admitting: Allergy

## 2019-03-11 MED ORDER — ALBUTEROL SULFATE HFA 108 (90 BASE) MCG/ACT IN AERS: 2.0000 | INHALATION_SPRAY | Freq: Four times a day (QID) | RESPIRATORY_TRACT | 1 refills | Status: DC | PRN

## 2019-03-11 MED ORDER — OMEPRAZOLE 20 MG PO CPDR: 20.0000 mg | DELAYED_RELEASE_CAPSULE | Freq: Every day | ORAL | 5 refills | Status: DC

## 2019-03-11 MED ORDER — ONDANSETRON 8 MG PO TBDP: 8.0000 mg | ORAL_TABLET | Freq: Three times a day (TID) | ORAL | 2 refills | Status: DC | PRN

## 2019-03-11 MED ORDER — SPACER/AERO-HOLDING CHAMBERS DEVI: 0 refills | Status: AC

## 2019-03-12 ENCOUNTER — Ambulatory Visit (INDEPENDENT_AMBULATORY_CARE_PROVIDER_SITE_OTHER): Payer: No Typology Code available for payment source | Admitting: Allergy

## 2019-03-12 ENCOUNTER — Encounter: Payer: Self-pay | Admitting: Allergy

## 2019-03-12 ENCOUNTER — Other Ambulatory Visit: Payer: Self-pay

## 2019-03-12 VITALS — Ht 62.0 in | Wt 128.0 lb

## 2019-03-12 DIAGNOSIS — J3089 Other allergic rhinitis: Secondary | ICD-10-CM

## 2019-03-12 DIAGNOSIS — T7840XD Allergy, unspecified, subsequent encounter: Secondary | ICD-10-CM

## 2019-03-12 DIAGNOSIS — J4540 Moderate persistent asthma, uncomplicated: Secondary | ICD-10-CM | POA: Diagnosis not present

## 2019-03-12 NOTE — Progress Notes (Signed)
RE: [name] [name] MRN: [id number] DOB: [birth year] Date of Telemedicine Visit: 03/12/2019  Referring provider: [name], MD Primary care provider: [name], MD  Chief Complaint: allergic reactions   Telemedicine Follow Up Visit via Telephone: I connected with [name] [name] for a follow up on 03/12/19 by telephone and verified that I am speaking with the correct person using two identifiers.   I discussed the limitations, risks, security and privacy concerns of performing an evaluation and management service by telephone and the availability of in person appointments. I also discussed with the patient that there may be a patient responsible charge related to this service. The patient expressed understanding and agreed to proceed.  Patient is at home.  Provider is at the office.  Visit start time: 1327 Visit end time: 67 Insurance consent/check in by: Desma Paganini C  Medical consent and medical assistant/nurse: Tempie Donning  History of Present Illness: She is a 33 y.o. female, who is being followed for allergic reactions with history of food allergy. Her previous allergy office visit was on 02/18/2019 with Dr. Delorse Lek.  She states she has been doing "ok" since last visit but states she has had to cut out processed dairy, eggs and bacon from her diet now as well.  She did meet with nutrition however the nutritionist she met with per patient reported she was not well versed in food allergy.  Thus she states she was going to discuss with the other nutritionist who does handle food allergy patients.  She would like to find a "safe" MVI she can take.   She states her weight is holding steady at about 128lbs.   Her big concern today is with the onset of pollen she has been having more respiratory issues with SOB and chest tightness/burning.  She states it is triggered when she has to go inside or when her husband leaves the house and comes back in "bringing in the pollen".   She is using  albuterol multiple times a day (about 3) "50% of the week" and she does get relief.  She is using flovent 2 puffs twice a day a now as well.   She is also using flonase consistently to help with congestion as well as zyrtec.  She will use hydroxyzine, pepcid as needed during the reactions she has been having.  She has access to epipen but has not required further use.    She has an appt on 5/20 with Dr. Ellie Lunch at [facility] for 2nd opinion regarding her reactions.   Assessment and Plan: [name] [name] is a 33 y.o. female with: Allergic reaction/Food allergy  Allergic rhinitis with OAS Reactive airway   - Continue medications below:                A.  Dymista 1 spray each nostril twice a day or Flonase (if out of dymisata) 1-2 sprays each nostril daily for nasal congestion/drainage               B.  Albuterol inhaler 2 puffs every 4 hours as needed for cough, wheeze or shortness of breath    C.  Flovent 2 puffs twice a day                C.  Zyrtec 10mg  at bedtime and as needed during the day               E.  Famotidine 20 mg  as needed    F.  Hydroxyzine 10  mg - 1-2 tablets up to 3 times a day as needed for allergic reaction    H. Have access to your Epipen    I. Use prednisone 10mg  -20mg  as needed for allergic reaction management   - serum IgE to foods recently tested for showed detectable IgE levels to apple, peach, pear, soybean. Blueberry, grape, banana, spinach, cauliflower, celery, lettuce, broccoli, cabbage, cucumber, green pea, tomato, carrot, white potato, green bean, onion, bean, pumpkin, coconut, sage IgE (chia seed representative) are negative - Continue to keep track of reactions and note any triggers.  - will hold off on Xolair for now as appeared to worsen respiratory symptoms - currently working with nutrition on diet plan and appropriate MVI - second-opinion allergy appt scheduled for 04/29/19 with Dr. Ellie Lunch and working to see if appt can be moved up however in midst of Covid-19  issuesthis is likely possible - ENT, GI and cardiology evaluations has been reassuring to-date.  She has had reassuring labs and imaging to rule-out pancreatitis or gallbladder disease - I did discuss possible option of trying a hypo-allergenic formula/supplement like Elecare which would have very limit potential to cause reaction.  She will think about this option especially if weight continues to drop.  We both agree that she should continue to eat regular/solid foods - discussed plan to initiate allergen immunotherapy (allergy shots) after appt at [facility] and once tree pollen season has lessened and concern surrounding Covid-19 has improved.  - also discussed increasing to Symbicort due to increased respiratory symptoms with increased albuterol need however she would like to not make changes and continue with flovent at this time.   Diagnostics: None.  Medication List:  Current Outpatient Medications  Medication Sig Dispense Refill  . albuterol (PROAIR HFA) 108 (90 Base) MCG/ACT inhaler Inhale 2 puffs into the lungs every 6 (six) hours as needed for wheezing or shortness of breath. With spacer 18 g 1  . cetirizine (ZYRTEC ALLERGY) 10 MG tablet Take 1 tablet (10 mg total) by mouth daily. 30 tablet 3  . EPINEPHrine 0.3 mg/0.3 mL IJ SOAJ injection Inject 0.3 mLs (0.3 mg total) into the muscle as needed for anaphylaxis. 1 Device 2  . famotidine (PEPCID) 40 MG tablet Take 1 tablet (40 mg total) by mouth 2 (two) times daily. (Patient taking differently: Take 40 mg by mouth as needed. ) 60 tablet 5  . Fluocinolone Acetonide Scalp 0.01 % OIL APP TO THE SCALP ONCE D    . fluticasone (FLONASE) 50 MCG/ACT nasal spray Place 1 spray into both nostrils as needed for allergies or rhinitis. 16 g 5  . fluticasone (FLOVENT HFA) 110 MCG/ACT inhaler Inhale 2 puffs into the lungs 2 (two) times daily as needed. 1 Inhaler 12  . hydrOXYzine (ATARAX) 10 MG/5ML syrup Take 5 mLs (10 mg total) by mouth 3 (three) times daily  as needed. 240 mL 0  . nitrofurantoin, macrocrystal-monohydrate, (MACROBID) 100 MG capsule Take 1 tablet after intercourse    . omalizumab (XOLAIR) 150 MG injection Inject 300 mg into the skin every 28 (twenty-eight) days.    Marland Kitchen omeprazole (PRILOSEC) 20 MG capsule Take 1 capsule (20 mg total) by mouth daily. 30 capsule 5  . ondansetron (ZOFRAN ODT) 8 MG disintegrating tablet Take 1 tablet (8 mg total) by mouth every 8 (eight) hours as needed for nausea or vomiting. 10 tablet 2  . Selenium Sulfide 2.3 % SHAM 1 APPLICATION TO SCALP APPLY D PRN FOR DRANDUFF    . Spacer/Aero-Holding [employer]  DEVI Use as directed with albuterol hfa inhaler. 1 each 0   Current Facility-Administered Medications  Medication Dose Route Frequency Provider Last Rate Last Dose  . omalizumab Geoffry Paradise) injection 300 mg  300 mg Subcutaneous Q28 days Marcelyn Bruins, MD   300 mg at 01/27/19 1020   Allergies: Allergies  Allergen Reactions  . Benadryl [Diphenhydramine]     Chest Pain  . Other     Tree nuts   . Peanut-Containing Drug Products Anaphylaxis  . Corn-Containing Products   . Soy Allergy   . Tobramycin Other (See Comments)    Caused dizziness  . Ketoconazole Rash   I reviewed her past medical history, social history, family history, and environmental history and no significant changes have been reported from previous visit on 02/18/2019.  Review of Systems  Constitutional: Positive for unexpected weight change. Negative for chills and fever.  HENT: Positive for congestion and rhinorrhea. Negative for sinus pressure and sore throat.   Eyes: Negative for discharge and itching.  Respiratory: Positive for cough, chest tightness and shortness of breath.   Cardiovascular: Positive for chest pain.  Gastrointestinal: Positive for abdominal pain. Negative for constipation, diarrhea and vomiting.  Musculoskeletal: Negative for myalgias.  Skin: Negative for rash.  Neurological: Negative for dizziness and  headaches.   Objective: Physical Exam Not obtained as encounter was done via telephone.   Previous notes and tests were reviewed.  I discussed the assessment and treatment plan with the patient. The patient was provided an opportunity to ask questions and all were answered. The patient agreed with the plan and demonstrated an understanding of the instructions.   The patient was advised to call back or seek an in-person evaluation if the symptoms worsen or if the condition fails to improve as anticipated.  I provided 43 minutes of non-face-to-face time during this encounter.  It was my pleasure to participate in [place] care today. Please feel free to contact me with any questions or concerns.   Sincerely,  Jaquel Coomer Larose Hires, MD [facility]

## 2019-03-12 NOTE — Patient Instructions (Addendum)
Allergic reaction/Food allergy  Allergic rhinitis with OAS Reactive airway   - Continue medications below:                A.  Dymista 1 spray each nostril twice a day or Flonas (if out of dymisata) 1-2 sprays each nostril daily for nasal congestion/drainage               B.  Albuterol inhaler 2 puffs every 4 hours as needed for cough, wheeze or shortness of breath    C.  Flovent 2 puffs twice a day                C.  Zyrtec 10mg  at bedtime and as needed during the day               E.  Famotidine 20 mg  as needed    F.  Hydroxyzine 10 mg - 1-2 tablets up to 3 times a day as needed for allergic reaction    H. Have access to your Epipen    I. Use prednisone 10mg  -20mg  as needed for allergic reaction management   - serum IgE to foods recently tested for showed detectable IgE levels to apple, peach, pear, soybean. Blueberry, grape, banana, spinach, cauliflower, celery, lettuce, broccoli, cabbage, cucumber, green pea, tomato, carrot, white potato, green bean, onion, bean, pumpkin, coconut, sage IgE (chia seed representative) are negative - Continue to keep track of reactions and note any triggers.  - will hold off on Xolair for now as appeared to worsen respiratory symptoms - currently working with nutrition on diet plan and appropriate multivitamin - second-opinion allergy appt scheduled for 04/29/19 with Dr. Ellie Lunch and working to see if appt can be moved up however in midst of Covid-19 issues this is likely possible - ENT, GI and cardiology evaluations has been reassuring to-date.  She has had reassuring labs and imaging to rule-out pancreatitis or gallbladder disease - I did discuss possible option of trying a hypo-allergenic formula/supplement like Elecare which would have very limit potential to cause reaction.  She will think about this option especially if weight continues to drop.  We both agree that she should continue to eat regular/solid foods - discussed plan to initiate allergen  immunotherapy (allergy shots) after appt at [facility] and once tree pollen season has lessened and concern surrounding Covid-19 has improved.

## 2019-03-18 ENCOUNTER — Other Ambulatory Visit: Payer: Self-pay

## 2019-03-18 ENCOUNTER — Encounter: Payer: Self-pay | Admitting: Allergy

## 2019-03-18 ENCOUNTER — Ambulatory Visit (INDEPENDENT_AMBULATORY_CARE_PROVIDER_SITE_OTHER): Payer: No Typology Code available for payment source | Admitting: Allergy

## 2019-03-18 DIAGNOSIS — T7819XD Other adverse food reactions, not elsewhere classified, subsequent encounter: Secondary | ICD-10-CM

## 2019-03-18 DIAGNOSIS — J3089 Other allergic rhinitis: Secondary | ICD-10-CM

## 2019-03-18 DIAGNOSIS — K219 Gastro-esophageal reflux disease without esophagitis: Secondary | ICD-10-CM

## 2019-03-18 DIAGNOSIS — T7840XD Allergy, unspecified, subsequent encounter: Secondary | ICD-10-CM

## 2019-03-18 DIAGNOSIS — T781XXD Other adverse food reactions, not elsewhere classified, subsequent encounter: Secondary | ICD-10-CM

## 2019-03-18 DIAGNOSIS — J4540 Moderate persistent asthma, uncomplicated: Secondary | ICD-10-CM | POA: Diagnosis not present

## 2019-03-18 NOTE — Progress Notes (Signed)
RE: [name] [name] MRN: [id number] DOB: 29-Jun-1986 Date of Telemedicine Visit: 03/18/2019  Referring provider: [name], MD Primary care provider: [name], MD  Chief Complaint: Food Intolerance   Telemedicine Follow Up Visit via Telephone: I connected with [name] [name] for a follow up on 03/18/19 by telephone and verified that I am speaking with the correct person using two identifiers.   I discussed the limitations, risks, security and privacy concerns of performing an evaluation and management service by telephone and the availability of in person appointments. I also discussed with the patient that there may be a patient responsible charge related to this service. The patient expressed understanding and agreed to proceed.  Patient is at home.  Provider is at the office.  Visit start time: 1555 Visit end time: 1626 Insurance consent/check in by: Marlene Bast Medical consent and medical assistant/nurse:Trina S  History of Present Illness: She is a 33 y.o. female, who is being followed for recurrent allergic reactions. Her previous allergy office virutal visit was on 03/12/2019 with Dr. Delorse Lek.   She states she had a decent week up until today when she tried sweet potatoes again after about 3 weeks.  She states she cooked the sweet potato and was basically mushy and she had about 1/8th of the food. After 2nd bite she felt like it wouldn't go down her throat.  She became mildly itchy and felt like a "puffiness" in her throat.  She took 2 more bites and then about 8 minute later with persistent symptoms she took zyrtec. After about 10 minutes she took pepcid.  Symptoms still remained and she took a prednisone dose and after 30 minutes symptoms resolved.  She does have access to epipen.   Otherwise she states she was able to tolerate blueberries, blackberries and grapes as newly added back in foods to diet.  She also states she can eat chicken and salmon and sausage without issues.    She states she has lost more weight is now 125lb.  She states she loast 3 lbs in past week.   She has not tried the protein powder shake she has to add into diet as a supplement yet.    Assessment and Plan: [name] is a 33 y.o. female with: Allergic reaction/Food allergy  Allergic rhinitis with OAS Reactive airway GER   - Continue medications below:                A.  Dymista 1 spray each nostril twice a day or Flonase (if out of dymisata) 1-2 sprays each nostril daily for nasal congestion/drainage               B.  Albuterol inhaler 2 puffs every 4 hours as needed for cough, wheeze or shortness of breath    C.  Flovent 2 puffs twice a day                C.  Zyrtec 10mg  at bedtime and as needed during the day               E.  Famotidine 20 mg  as needed    F.  Hydroxyzine 10 mg - 1-2 tablets up to 3 times a day as needed for allergic reaction    H. Have access to your Epipen    I. Use prednisone 10mg  -20mg  as needed for allergic reaction management   - serum IgE to foods recently tested for showed detectable IgE levels to apple, peach,  pear, soybean. Blueberry, grape, banana, spinach, cauliflower, celery, lettuce, broccoli, cabbage, cucumber, green pea, tomato, carrot, white potato, green bean, onion, bean, pumpkin, coconut, sage IgE (chia seed representative) are negative - Continue to keep track of reactions and note any triggers.  - will hold off on Xolair for now as appeared to worsen respiratory symptoms - currently working with nutrition on diet plan and appropriate multivitamin - second-opinion allergy appt scheduled for 04/29/19 with Dr. Ellie Lunch and working to see if appt can be moved up however in midst of Covid-19 issues this is likely possible - ENT, GI and cardiology evaluations has been reassuring to-date.  She has had reassuring labs and imaging to rule-out pancreatitis or gallbladder disease - I did discuss possible option of trying a hypo-allergenic formula/supplement  like Elecare which would have very limit potential to cause reaction.  She will continue to think about this option especially if weight continues to drop.  We both agree that she should continue to eat regular/solid foods as much as possible - discussed plan to initiate allergen immunotherapy (allergy shots) after appt at [facility] and once tree pollen season has lessened and concern surrounding Covid-19 has improved.   Diagnostics: None.  Medication List:  Current Outpatient Medications  Medication Sig Dispense Refill  . albuterol (PROAIR HFA) 108 (90 Base) MCG/ACT inhaler Inhale 2 puffs into the lungs every 6 (six) hours as needed for wheezing or shortness of breath. With spacer 18 g 1  . cetirizine (ZYRTEC ALLERGY) 10 MG tablet Take 1 tablet (10 mg total) by mouth daily. 30 tablet 3  . EPINEPHrine 0.3 mg/0.3 mL IJ SOAJ injection Inject 0.3 mLs (0.3 mg total) into the muscle as needed for anaphylaxis. 1 Device 2  . famotidine (PEPCID) 40 MG tablet Take 1 tablet (40 mg total) by mouth 2 (two) times daily. (Patient taking differently: Take 40 mg by mouth as needed. ) 60 tablet 5  . Fluocinolone Acetonide Scalp 0.01 % OIL APP TO THE SCALP ONCE D    . fluticasone (FLONASE) 50 MCG/ACT nasal spray Place 1 spray into both nostrils as needed for allergies or rhinitis. 16 g 5  . fluticasone (FLOVENT HFA) 110 MCG/ACT inhaler Inhale 2 puffs into the lungs 2 (two) times daily as needed. 1 Inhaler 12  . hydrOXYzine (ATARAX) 10 MG/5ML syrup Take 5 mLs (10 mg total) by mouth 3 (three) times daily as needed. 240 mL 0  . nitrofurantoin, macrocrystal-monohydrate, (MACROBID) 100 MG capsule Take 1 tablet after intercourse    . omalizumab (XOLAIR) 150 MG injection Inject 300 mg into the skin every 28 (twenty-eight) days.    Marland Kitchen omeprazole (PRILOSEC) 20 MG capsule Take 1 capsule (20 mg total) by mouth daily. 30 capsule 5  . ondansetron (ZOFRAN ODT) 8 MG disintegrating tablet Take 1 tablet (8 mg total) by mouth every 8  (eight) hours as needed for nausea or vomiting. 10 tablet 2  . Selenium Sulfide 2.3 % SHAM 1 APPLICATION TO SCALP APPLY D PRN FOR DRANDUFF    . Spacer/Aero-Holding Rudean Curt Use as directed with albuterol hfa inhaler. 1 each 0   Current Facility-Administered Medications  Medication Dose Route Frequency Provider Last Rate Last Dose  . omalizumab Geoffry Paradise) injection 300 mg  300 mg Subcutaneous Q28 days Marcelyn Bruins, MD   300 mg at 01/27/19 1020   Allergies: Allergies  Allergen Reactions  . Benadryl [Diphenhydramine]     Chest Pain  . Other     Tree nuts   .  Peanut-Containing Drug Products Anaphylaxis  . Corn-Containing Products   . Soy Allergy   . Tobramycin Other (See Comments)    Caused dizziness  . Ketoconazole Rash   I reviewed her past medical history, social history, family history, and environmental history and no significant changes have been reported from previous visit on 03/12/2019.  Review of Systems  Constitutional: Negative for chills and fever.  HENT: Positive for congestion and trouble swallowing. Negative for postnasal drip, rhinorrhea, sinus pressure, sinus pain, sneezing, sore throat and voice change.   Eyes: Negative for pain, discharge and itching.  Respiratory: Positive for cough and shortness of breath.   Cardiovascular: Negative.   Gastrointestinal: Positive for abdominal pain and nausea. Negative for vomiting.  Musculoskeletal: Negative for myalgias.  Skin: Negative for rash.  Neurological: Negative for headaches.   Objective: Physical Exam Not obtained as encounter was done via telephone.   Previous notes and tests were reviewed.  I discussed the assessment and treatment plan with the patient. The patient was provided an opportunity to ask questions and all were answered. The patient agreed with the plan and demonstrated an understanding of the instructions.   The patient was advised to call back or seek an in-person evaluation if the  symptoms worsen or if the condition fails to improve as anticipated.  I provided 31 minutes of non-face-to-face time during this encounter.  It was my pleasure to participate in [place] care today. Please feel free to contact me with any questions or concerns.   Sincerely,  Rosiland Sen Larose Hires, MD

## 2019-03-18 NOTE — Progress Notes (Signed)
Start time:  1555 Finish Time:  1626 Where are you located:  Home Do we have permission to bill your insurance:  yes Are you signed up for my chart:  yes  What medications have you taken this week?  Flonase, hydroxyzine, famotidine, Flovent and albuterol.  Feels like the meds are working, the biggest challenge has been weight loss and having allergic reactions to food. Having trouble tolerating the same brand twice.  Had issues today with a food and said she had anaphylaxis. She said no she didn't she used prednisone.

## 2019-03-19 NOTE — Patient Instructions (Addendum)
Allergic reaction/Food allergy  Allergic rhinitis with OAS Reactive airway GER   - Continue medications below:                A.  Dymista 1 spray each nostril twice a day or Flonase (if out of dymisata) 1-2 sprays each nostril daily for nasal congestion/drainage               B.  Albuterol inhaler 2 puffs every 4 hours as needed for cough, wheeze or shortness of breath    C.  Flovent 2 puffs twice a day                C.  Zyrtec 10mg  at bedtime and as needed during the day               E.  Famotidine 20 mg  as needed    F.  Hydroxyzine 10 mg - 1-2 tablets up to 3 times a day as needed for allergic reaction    H. Have access to your Epipen    I. Use prednisone 10mg  -20mg  as needed for allergic reaction management   - serum IgE to foods recently tested for showed detectable IgE levels to apple, peach, pear, soybean. Blueberry, grape, banana, spinach, cauliflower, celery, lettuce, broccoli, cabbage, cucumber, green pea, tomato, carrot, white potato, green bean, onion, bean, pumpkin, coconut, sage IgE (chia seed representative) are negative - Continue to keep track of reactions and note any triggers.  - will hold off on Xolair for now as appeared to worsen respiratory symptoms - currently working with nutrition on diet plan and appropriate multivitamin - second-opinion allergy appt scheduled for 04/29/19 with Dr. Ellie Lunch and working to see if appt can be moved up however in midst of Covid-19 issues this is likely possible - ENT, GI and cardiology evaluations has been reassuring to-date.  She has had reassuring labs and imaging to rule-out pancreatitis or gallbladder disease - I did discuss possible option of trying a hypo-allergenic formula/supplement like Elecare which would have very limit potential to cause reaction.  She will continue to think about this option especially if weight continues to drop.  We both agree that she should continue to eat regular/solid foods as much as possible -  discussed plan to initiate allergen immunotherapy (allergy shots) after appt at [facility] and once tree pollen season has lessened and concern surrounding Covid-19 has improved.

## 2019-03-24 ENCOUNTER — Telehealth: Payer: Self-pay | Admitting: *Deleted

## 2019-03-24 NOTE — Telephone Encounter (Signed)
Rolan Lipa with [employer] called and would like to talk to Dr. Delorse Lek. She needs a clear understanding of why patient is not able to do her job. Needs a clear picture of what the true diagnosis. Please call her back [phone]. Claim # [id number].

## 2019-03-25 ENCOUNTER — Other Ambulatory Visit: Payer: Self-pay

## 2019-03-25 ENCOUNTER — Ambulatory Visit (INDEPENDENT_AMBULATORY_CARE_PROVIDER_SITE_OTHER): Payer: No Typology Code available for payment source | Admitting: Allergy

## 2019-03-25 ENCOUNTER — Telehealth: Payer: Self-pay | Admitting: *Deleted

## 2019-03-25 ENCOUNTER — Encounter: Payer: Self-pay | Admitting: Allergy

## 2019-03-25 ENCOUNTER — Encounter: Payer: Self-pay | Admitting: *Deleted

## 2019-03-25 DIAGNOSIS — J3089 Other allergic rhinitis: Secondary | ICD-10-CM | POA: Diagnosis not present

## 2019-03-25 DIAGNOSIS — T781XXD Other adverse food reactions, not elsewhere classified, subsequent encounter: Secondary | ICD-10-CM

## 2019-03-25 DIAGNOSIS — T7840XD Allergy, unspecified, subsequent encounter: Secondary | ICD-10-CM

## 2019-03-25 DIAGNOSIS — J4540 Moderate persistent asthma, uncomplicated: Secondary | ICD-10-CM

## 2019-03-25 DIAGNOSIS — K219 Gastro-esophageal reflux disease without esophagitis: Secondary | ICD-10-CM

## 2019-03-25 NOTE — Telephone Encounter (Signed)
Discussed at televisit today.

## 2019-03-25 NOTE — Progress Notes (Signed)
RE: [name] [name] MRN: [id number] DOB: 1986/08/23 Date of Telemedicine Visit: 03/25/2019  Referring provider: [name], MD Primary care provider: [name], MD  Chief Complaint: Food Intolerance   Telemedicine Follow Up Visit via Telephone: I connected with [name] [name] for a follow up on 03/25/19 by telephone and verified that I am speaking with the correct person using two identifiers.   I discussed the limitations, risks, security and privacy concerns of performing an evaluation and management service by telephone and the availability of in person appointments. I also discussed with the patient that there may be a patient responsible charge related to this service. The patient expressed understanding and agreed to proceed.  Patient is at home.   Provider is at the office.  Visit start time: 10:58  - pt was on hold for MD 15 minutes after check-in start time.  Total visit time 36 minutes Visit end time: 11:49 Insurance consent/check in by: Marlene Bast Medical consent and medical assistant/nurse: Darreld Mclean S  History of Present Illness: She is a 33 y.o. female, who is being followed for food allergy with recurrent allergic reactions. Her previous allergy office visit was on 03/18/19 with Dr. Delorse Lek.   She states she tried the protein shake in the past week and states she developed the usual lump in the her throat sensation that continued to feel like it was getting bigger after ingestion.  She took her antihistamine based medications to help with the symptoms.  That she does not want to continue to take this protein shake.  She also states that she had trouble with green beans as well and believes it is due to it being a legume and she does have a peanut allergy. She states she is ready to start allergen immunotherapy now versus waiting until after her [facility] allergy appointment.  She states she would like to have some shots and prior to her visits that she can discuss potentially a  modified rush protocol with them to allow for her to reach maintenance quicker and get to symptom improvement faster.  Her biggest concern right now is that with the tree pollen out that she is having a lot more issues surrounding food ingestion due to her oral allergy syndrome. She also is hoping to have a multivitamin formulated by her specialty pharmacy as well as is wondering if there is a protein supplement that she can take that only contains protein and no additional food products. She states that she is taking measures to decrease her pollen exposure as she has not been leaving the house.  She states her kids also have not been leaving the house.  The only person that leaves the house at this time is her husband and he does take off his clothes when he comes in from being outside.  She also has several air purifiers and has been diligently making sure her house is clean and clear of dust and other allergens.   Assessment and Plan: [name] [name] is a 33 y.o. female with:  Allergic reaction/Food allergy  Allergic rhinitis with OAS Reactive airway GER   - Continue medications below:                A.  Dymista 1 spray each nostril twice a day or Flonase (if dymisata unavailable) 1-2 sprays each nostril daily for nasal congestion/drainage               B.  Albuterol inhaler 2 puffs every 4 hours as needed  for cough, wheeze or shortness of breath    C.  Flovent 2 puffs twice a day                C.  Zyrtec 10mg  at bedtime and as needed during the day               E.  Famotidine 20 mg  as needed    F.  Hydroxyzine 10 mg - 1-2 tablets up to 3 times a day as needed for allergic reaction    H. Have access to your Epipen    I. Use prednisone 10mg  -20mg  as needed for allergic reaction management   - serum IgE to foods recently tested for showed detectable IgE levels to apple, peach, pear, soybean. Blueberry, grape, banana, spinach, cauliflower, celery, lettuce, broccoli, cabbage, cucumber, green  pea, tomato, carrot, white potato, green bean, onion, bean, pumpkin, coconut, sage IgE (chia seed representative) are negative - Continue to keep track of reactions and note any triggers.  - will hold off on Xolair for now as appeared to worsen respiratory symptoms - currently working with nutrition on diet plan and appropriate multivitamin - second-opinion allergy appt scheduled for 04/29/19 with Dr. Ellie Lunch  - ENT, GI and cardiology evaluations has been reassuring to-date.  She has had reassuring labs and imaging to rule-out pancreatitis or gallbladder disease -We have previously discussed possible option of trying a hypo-allergenic formula/supplement like Elecare which would have very limit potential to cause reaction.  She will continue to think about this option especially if weight continues to drop.  We both agree that she should continue to eat regular/solid foods as much as possible -After discussion today she would like to go ahead and initiate allergy immunotherapy now before her [facility] appointment.  She has a start injection appointment scheduled for April 29.  She has been advised to make sure she takes her antihistamines prior to her injection and to have bring her EpiPen on days of her injections.  Will have weekly injections at this time.  We unfortunately are not not able to accommodate a cluster or modified rash immunotherapy protocol and she would like to discuss this with Dr. Ellie Lunch at her upcoming appointment.  Diagnostics: None.  Medication List:  Current Outpatient Medications  Medication Sig Dispense Refill  . albuterol (PROAIR HFA) 108 (90 Base) MCG/ACT inhaler Inhale 2 puffs into the lungs every 6 (six) hours as needed for wheezing or shortness of breath. With spacer 18 g 1  . cetirizine (ZYRTEC ALLERGY) 10 MG tablet Take 1 tablet (10 mg total) by mouth daily. 30 tablet 3  . EPINEPHrine 0.3 mg/0.3 mL IJ SOAJ injection Inject 0.3 mLs (0.3 mg total) into the muscle as needed for  anaphylaxis. 1 Device 2  . famotidine (PEPCID) 40 MG tablet Take 1 tablet (40 mg total) by mouth 2 (two) times daily. (Patient taking differently: Take 40 mg by mouth as needed. ) 60 tablet 5  . Fluocinolone Acetonide Scalp 0.01 % OIL APP TO THE SCALP ONCE D    . fluticasone (FLONASE) 50 MCG/ACT nasal spray Place 1 spray into both nostrils as needed for allergies or rhinitis. 16 g 5  . fluticasone (FLOVENT HFA) 110 MCG/ACT inhaler Inhale 2 puffs into the lungs 2 (two) times daily as needed. 1 Inhaler 12  . hydrOXYzine (ATARAX) 10 MG/5ML syrup Take 5 mLs (10 mg total) by mouth 3 (three) times daily as needed. 240 mL 0  . nitrofurantoin, macrocrystal-monohydrate, (MACROBID) 100  MG capsule Take 1 tablet after intercourse    . omalizumab (XOLAIR) 150 MG injection Inject 300 mg into the skin every 28 (twenty-eight) days.    Marland Kitchen omeprazole (PRILOSEC) 20 MG capsule Take 1 capsule (20 mg total) by mouth daily. 30 capsule 5  . ondansetron (ZOFRAN ODT) 8 MG disintegrating tablet Take 1 tablet (8 mg total) by mouth every 8 (eight) hours as needed for nausea or vomiting. 10 tablet 2  . Selenium Sulfide 2.3 % SHAM 1 APPLICATION TO SCALP APPLY D PRN FOR DRANDUFF    . Spacer/Aero-Holding Rudean Curt Use as directed with albuterol hfa inhaler. 1 each 0   Allergies: Allergies  Allergen Reactions  . Benadryl [Diphenhydramine]     Chest Pain  . Other     Tree nuts   . Peanut-Containing Drug Products Anaphylaxis  . Corn-Containing Products   . Soy Allergy   . Tobramycin Other (See Comments)    Caused dizziness  . Ketoconazole Rash   I reviewed her past medical history, social history, family history, and environmental history and no significant changes have been reported from previous visit on 03/18/19.  Review of Systems  Constitutional: Positive for fatigue and unexpected weight change. Negative for appetite change and fever.  HENT: Positive for congestion and rhinorrhea. Negative for nosebleeds, sinus  pressure and sinus pain.   Eyes: Negative for discharge and itching.  Respiratory: Negative for cough, chest tightness, shortness of breath and wheezing.   Cardiovascular: Negative for chest pain.  Gastrointestinal: Negative for abdominal pain, constipation, diarrhea, nausea and vomiting.  Musculoskeletal: Negative for myalgias.  Skin: Negative for rash.  Allergic/Immunologic: Positive for environmental allergies and food allergies.  Neurological: Negative for headaches.   Objective: Physical Exam Not obtained as encounter was done via telephone.   Previous notes and tests were reviewed.  I discussed the assessment and treatment plan with the patient. The patient was provided an opportunity to ask questions and all were answered. The patient agreed with the plan and demonstrated an understanding of the instructions.   The patient was advised to call back or seek an in-person evaluation if the symptoms worsen or if the condition fails to improve as anticipated.  I provided 36 minutes of non-face-to-face time during this encounter.  It was my pleasure to participate in [place] care today. Please feel free to contact me with any questions or concerns.   Sincerely,  Danica Camarena Larose Hires, MD

## 2019-03-25 NOTE — Telephone Encounter (Signed)
Called Ms. [name] [name] back and answered her questions regarding the claim.

## 2019-03-25 NOTE — Progress Notes (Signed)
VIALS EXP 03-24-2020 

## 2019-03-25 NOTE — Telephone Encounter (Signed)
Called patient on and advised her due to current COVID 19 pandemic, our office is severely reducing in person visits in order to minimize the risk to our patients and healthcare providers. We recommend to convert your appointment to a video visit.  She stated she only wanted to do a tele visit. Confirmed this #  [phone] to use, rescheduled her for next Mon and updated EMR. Patient had no questions, verbalized understanding, appreciation.

## 2019-03-25 NOTE — Progress Notes (Signed)
Start time:  64 Finish Time:  1149 Where are you located:  home Do you give [place] permission to bill your insurance:  yes Are you signed up for my chart:  Yes  No new concerns today

## 2019-03-26 DIAGNOSIS — J301 Allergic rhinitis due to pollen: Secondary | ICD-10-CM | POA: Diagnosis not present

## 2019-03-26 NOTE — Patient Instructions (Addendum)
Allergic reaction/Food allergy  Allergic rhinitis with OAS Reactive airway GER   - Continue medications below:                A.  Dymista 1 spray each nostril twice a day or Flonase (if dymisata unavailable) 1-2 sprays each nostril daily for nasal congestion/drainage               B.  Albuterol inhaler 2 puffs every 4 hours as needed for cough, wheeze or shortness of breath    C.  Flovent 2 puffs twice a day                C.  Zyrtec 10mg  at bedtime and as needed during the day               E.  Famotidine 20 mg  as needed    F.  Hydroxyzine 10 mg - 1-2 tablets up to 3 times a day as needed for allergic reaction    H. Have access to your Epipen    I. Use prednisone 10mg  -20mg  as needed for allergic reaction management   - serum IgE to foods recently tested for showed detectable IgE levels to apple, peach, pear, soybean. Blueberry, grape, banana, spinach, cauliflower, celery, lettuce, broccoli, cabbage, cucumber, green pea, tomato, carrot, white potato, green bean, onion, bean, pumpkin, coconut, sage IgE (chia seed representative) are negative - Continue to keep track of reactions and note any triggers.  - will hold off on Xolair for now as appeared to worsen respiratory symptoms - currently working with nutrition on diet plan and appropriate multivitamin - second-opinion allergy appt scheduled for 04/29/19 with Dr. Ellie Lunch  - ENT, GI and cardiology evaluations has been reassuring to-date.  She has had reassuring labs and imaging to rule-out pancreatitis or gallbladder disease -We have previously discussed possible option of trying a hypo-allergenic formula/supplement like Elecare which would have very limit potential to cause reaction.  She will continue to think about this option especially if weight continues to drop.  We both agree that she should continue to eat regular/solid foods as much as possible -After discussion today she would like to go ahead and initiate allergy  immunotherapy now before her [facility] appointment.  She has a start injection appointment scheduled for April 29.  She has been advised to make sure she takes her antihistamines prior to her injection and to have bring her EpiPen on days of her injections.  Will have weekly injections at this time.  We unfortunately are not not able to accommodate a cluster or modified rash immunotherapy protocol and she would like to discuss this with Dr. Ellie Lunch at her upcoming appointment.

## 2019-03-30 ENCOUNTER — Ambulatory Visit (INDEPENDENT_AMBULATORY_CARE_PROVIDER_SITE_OTHER): Payer: No Typology Code available for payment source | Admitting: Diagnostic Neuroimaging

## 2019-03-30 ENCOUNTER — Other Ambulatory Visit: Payer: Self-pay

## 2019-03-30 ENCOUNTER — Encounter: Payer: Self-pay | Admitting: Diagnostic Neuroimaging

## 2019-03-30 DIAGNOSIS — F0781 Postconcussional syndrome: Secondary | ICD-10-CM | POA: Diagnosis not present

## 2019-03-30 DIAGNOSIS — H81399 Other peripheral vertigo, unspecified ear: Secondary | ICD-10-CM

## 2019-03-30 DIAGNOSIS — J3089 Other allergic rhinitis: Secondary | ICD-10-CM | POA: Diagnosis not present

## 2019-03-30 NOTE — Progress Notes (Signed)
     Virtual Visit via Telephone Note  I connected with [name] [name] on 03/30/19 at 11:30 AM EDT by telephone and verified that I am speaking with the correct person using two identifiers.   I discussed the limitations, risks, security and privacy concerns of performing an evaluation and management service by telephone and the availability of in person appointments. I also discussed with the patient that there may be a patient responsible charge related to this service. The patient expressed understanding and agreed to proceed.   History of Present Illness:  - sxs resolved since ~ Feb 2020 - doing well; no more headaches or vertigo - has second opinion for allergy evaluation at [facility] pending    Observations/Objective:  - awake, alert - no dysarthria   Assessment and Plan:  POST-TRAUMATIC HEADACHE / POST-CONCUSSION SYNDROME (headache, neck pain, irritability, photophobia) - optimize nutrition, exercise, rest, sleep - doing well  POSITIONAL VERTIGO (resolved) - monitor symptoms  MIGRAINE  - consider other migraine treatments in the future   Follow Up Instructions:  - Return for return to PCP.   I discussed the assessment and treatment plan with the patient. The patient was provided an opportunity to ask questions and all were answered. The patient agreed with the plan and demonstrated an understanding of the instructions.   The patient was advised to call back or seek an in-person evaluation if the symptoms worsen or if the condition fails to improve as anticipated.  I provided 5 minutes of non-face-to-face time during this encounter.   Suanne Marker, MD 03/30/2019, 12:00 PM Certified in Neurology, Neurophysiology and Neuroimaging  [facility] [address], [address] [place], [place] [address] [phone]

## 2019-03-31 ENCOUNTER — Telehealth: Payer: Self-pay

## 2019-03-31 NOTE — Telephone Encounter (Signed)
Message sent to patient via Mychart.

## 2019-03-31 NOTE — Telephone Encounter (Signed)
-----   Message from [facility] Larose Hires, MD sent at 03/31/2019  9:22 AM EDT ----- Regarding: multivitamin Pt wants a custom made multivitamin (MVI) and has already identified an pharmacy that can make custom MVI.      Please send the following via Mychart to pt:  ------------------  Hello,  I have reached out to the pharmacy and while they are capable of compounding a MVI pharmacist states it is not cost-effective for the patient however she was not able to tell me a ballpark cost.   I have looked into several MVI options that do not contain known foods you are allergic too or any added fruit/vegetable blends.   If you have not looked into please take a look and see if any of these would work for you before we proceed with the custom made MVI:   - One-A-Day women's multivitamin   More specialized MVI:   - NanoVM adult   https://[email or web address].com/product/nanovm-adult/  - Freeda Quintab-M with iron    [email or web address]  - Phlexy Vits  [email or web address].kelley.[email or web address]  Let me know if you still want to proceed with custom MVI and will send in the prescription.    Dr. Delorse Lek

## 2019-04-01 ENCOUNTER — Ambulatory Visit: Payer: No Typology Code available for payment source | Admitting: Allergy

## 2019-04-01 ENCOUNTER — Other Ambulatory Visit: Payer: Self-pay

## 2019-04-01 ENCOUNTER — Ambulatory Visit: Payer: No Typology Code available for payment source | Admitting: Diagnostic Neuroimaging

## 2019-04-01 ENCOUNTER — Ambulatory Visit (INDEPENDENT_AMBULATORY_CARE_PROVIDER_SITE_OTHER): Payer: No Typology Code available for payment source | Admitting: Allergy

## 2019-04-01 DIAGNOSIS — T781XXD Other adverse food reactions, not elsewhere classified, subsequent encounter: Secondary | ICD-10-CM | POA: Diagnosis not present

## 2019-04-01 DIAGNOSIS — J4540 Moderate persistent asthma, uncomplicated: Secondary | ICD-10-CM | POA: Diagnosis not present

## 2019-04-01 DIAGNOSIS — J3089 Other allergic rhinitis: Secondary | ICD-10-CM

## 2019-04-01 DIAGNOSIS — T7840XD Allergy, unspecified, subsequent encounter: Secondary | ICD-10-CM

## 2019-04-02 ENCOUNTER — Encounter: Payer: Self-pay | Admitting: Allergy

## 2019-04-02 NOTE — Progress Notes (Signed)
RE: [name] [name] MRN: [id number] DOB: [birth year] Date of Telemedicine Visit: 04/01/2019  Referring provider: [name], MD Primary care provider: [name], MD  Chief Complaint: Doing okay this week   Telemedicine Follow Up Visit via Telephone: I connected with [name] [name] for a follow up on 04/02/19 by telephone and verified that I am speaking with the correct person using two identifiers.   I discussed the limitations, risks, security and privacy concerns of performing an evaluation and management service by telephone and the availability of in person appointments. I also discussed with the patient that there may be a patient responsible charge related to this service. The patient expressed understanding and agreed to proceed.  Patient is at home.   Provider is at the office.  Visit start time: 1406 Visit end time: 1633 Insurance consent/check in by: Marlene Bast  medical consent and medical assistant/nurse: Lachelle S  History of Present Illness: She is a 33 y.o. female, who is being followed for allergic reaction with food allergy, allergic rhinitis with oral allergy syndrome, reactive airway and reflux. Her previous allergy office visit was on 03/25/2019 with Dr. Delorse Lek.   She states overall she has had an ok week.  She has been holding steady with the foods she has been tolerating and states she was even able to rotate egg and bacon back into the diet.  She also is drinking milk daily.  She has not had to remove any foods from the diet in the past week.  She did state she left the house on Monday while it was raining as she thought the pollen count would be lower due to rain.  She states when she got back home she didn't take her clothes off or get a shower right away and states that was a mistake as the next day she was having symptoms.  Reports symptoms included cough and chest tightness, increased nasal congestion.  She did use her flovent and albuterol and zyrtec.  When  she was still symptomatic she states she took prednisone 20mg  which did help relieve symptoms.    Assessment and Plan: [name] [name] is a 33 y.o. female with: Allergic reaction/Food allergy  Allergic rhinitis with OAS Reactive airway GER   - Continue medications below:                A.  Dymista 1 spray each nostril twice a day or Flonase (if dymisata unavailable) 1-2 sprays each nostril daily for nasal congestion/drainage               B.  Albuterol inhaler 2 puffs every 4 hours as needed for cough, wheeze or shortness of breath    C.  Flovent 2 puffs twice a day                C.  Zyrtec 10mg  at bedtime and as needed during the day               E.  Famotidine 20 mg  as needed    F.  Hydroxyzine 10 mg - 1-2 tablets up to 3 times a day as needed for allergic reaction    H. Have access to your Epipen    I. Use prednisone 10mg  -20mg  as needed for allergic reaction management   - serum IgE to foods recently tested for showed detectable IgE levels to apple, peach, pear, soybean. Blueberry, grape, banana, spinach, cauliflower, celery, lettuce, broccoli, cabbage, cucumber, green pea, tomato, carrot, white potato,  green bean, onion, bean, pumpkin, coconut, sage IgE (chia seed representative) are negative - Continue to keep track of reactions and note any triggers.  - will hold off on Xolair for now as appeared to worsen respiratory symptoms - currently working with nutrition on diet plan  - discussed MVI options that are safe to use in food allergic patients.  If needed [facility]   - second-opinion allergy appt scheduled for 04/29/19 with Dr. Ellie LunchLugar  - ENT, GI and cardiology evaluations has been reassuring to-date.  She has had reassuring labs and imaging to rule-out pancreatitis or gallbladder disease -We have previously discussed possible option of trying a hypo-allergenic formula/supplement like Elecare which would have very limit potential to cause  reaction.  She will continue to think about this option especially if weight continues to drop.  We both agree that she should continue to eat regular/solid foods as much as possible -allergen immunotherapy set to start next Wednesday.  Take antihistamine regimen prior to injections and have epipen with you on days of injections.    Diagnostics: None.  Medication List:  Current Outpatient Medications  Medication Sig Dispense Refill  . albuterol (PROAIR HFA) 108 (90 Base) MCG/ACT inhaler Inhale 2 puffs into the lungs every 6 (six) hours as needed for wheezing or shortness of breath. With spacer 18 g 1  . cetirizine (ZYRTEC ALLERGY) 10 MG tablet Take 1 tablet (10 mg total) by mouth daily. 30 tablet 3  . EPINEPHrine 0.3 mg/0.3 mL IJ SOAJ injection Inject 0.3 mLs (0.3 mg total) into the muscle as needed for anaphylaxis. 1 Device 2  . famotidine (PEPCID) 40 MG tablet Take 1 tablet (40 mg total) by mouth 2 (two) times daily. (Patient taking differently: Take 40 mg by mouth as needed. ) 60 tablet 5  . Fluocinolone Acetonide Scalp 0.01 % OIL APP TO THE SCALP ONCE D    . fluticasone (FLONASE) 50 MCG/ACT nasal spray Place 1 spray into both nostrils as needed for allergies or rhinitis. 16 g 5  . fluticasone (FLOVENT HFA) 110 MCG/ACT inhaler Inhale 2 puffs into the lungs 2 (two) times daily as needed. 1 Inhaler 12  . hydrOXYzine (ATARAX) 10 MG/5ML syrup Take 5 mLs (10 mg total) by mouth 3 (three) times daily as needed. 240 mL 0  . nitrofurantoin, macrocrystal-monohydrate, (MACROBID) 100 MG capsule Take 1 tablet after intercourse    . omalizumab (XOLAIR) 150 MG injection Inject 300 mg into the skin every 28 (twenty-eight) days.    Marland Kitchen. omeprazole (PRILOSEC) 20 MG capsule Take 1 capsule (20 mg total) by mouth daily. 30 capsule 5  . ondansetron (ZOFRAN ODT) 8 MG disintegrating tablet Take 1 tablet (8 mg total) by mouth every 8 (eight) hours as needed for nausea or vomiting. 10 tablet 2  . Selenium Sulfide 2.3 %  SHAM 1 APPLICATION TO SCALP APPLY D PRN FOR DRANDUFF    . Spacer/Aero-Holding Rudean Curthambers DEVI Use as directed with albuterol hfa inhaler. 1 each 0   Current Facility-Administered Medications  Medication Dose Route Frequency Provider Last Rate Last Dose  . omalizumab Geoffry Paradise(XOLAIR) injection 300 mg  300 mg Subcutaneous Q28 days Marcelyn BruinsPadgett, Shaylar Patricia, MD   300 mg at 01/27/19 1020   Allergies: Allergies  Allergen Reactions  . Benadryl [Diphenhydramine]     Chest Pain  . Other     Tree nuts   . Peanut-Containing Drug Products Anaphylaxis  . Corn-Containing Products   . Soy Allergy   .  Tobramycin Other (See Comments)    Caused dizziness  . Ketoconazole Rash   I reviewed her past medical history, social history, family history, and environmental history and no significant changes have been reported from previous visit on 03/25/2019.  Review of Systems  Constitutional: Positive for unexpected weight change. Negative for chills and fever.  HENT: Positive for congestion, rhinorrhea and sneezing.   Eyes: Negative for pain, discharge and itching.  Respiratory: Positive for cough, shortness of breath and wheezing.   Cardiovascular: Negative for chest pain.  Gastrointestinal: Negative for abdominal pain, constipation, diarrhea, nausea and vomiting.  Musculoskeletal: Negative for myalgias.  Skin: Negative for rash.  Neurological: Negative for headaches.   Objective: Physical Exam Not obtained as encounter was done via telephone.   Previous notes and tests were reviewed.  I discussed the assessment and treatment plan with the patient. The patient was provided an opportunity to ask questions and all were answered. The patient agreed with the plan and demonstrated an understanding of the instructions.   The patient was advised to call back or seek an in-person evaluation if the symptoms worsen or if the condition fails to improve as anticipated.  I provided 27 minutes of non-face-to-face time  during this encounter.  It was my pleasure to participate in [place] care today. Please feel free to contact me with any questions or concerns.   Sincerely,  Shaylar Larose Hires, MD

## 2019-04-02 NOTE — Patient Instructions (Addendum)
Allergic reaction/Food allergy  Allergic rhinitis with OAS Reactive airway GER   - Continue medications below:                A.  Dymista 1 spray each nostril twice a day or Flonase (if dymisata unavailable) 1-2 sprays each nostril daily for nasal congestion/drainage               B.  Albuterol inhaler 2 puffs every 4 hours as needed for cough, wheeze or shortness of breath    C.  Flovent 2 puffs twice a day                C.  Zyrtec 10mg  at bedtime and as needed during the day               E.  Famotidine 20 mg  as needed    F.  Hydroxyzine 10 mg - 1-2 tablets up to 3 times a day as needed for allergic reaction    H. Have access to your Epipen    I. Use prednisone 10mg  -20mg  as needed for allergic reaction management   - serum IgE to foods recently tested for showed detectable IgE levels to apple, peach, pear, soybean. Blueberry, grape, banana, spinach, cauliflower, celery, lettuce, broccoli, cabbage, cucumber, green pea, tomato, carrot, white potato, green bean, onion, bean, pumpkin, coconut, sage IgE (chia seed representative) are negative - Continue to keep track of reactions and note any triggers.  - will hold off on Xolair for now as appeared to worsen respiratory symptoms - currently working with nutrition on diet plan  - discussed MVI options that are safe to use in food allergic patients.  If needed we can custom make a [facility].   - second-opinion allergy appt scheduled for 04/29/19 with Dr. Ellie Lunch  - ENT, GI and cardiology evaluations has been reassuring to-date.  She has had reassuring labs and imaging to rule-out pancreatitis or gallbladder disease -We have previously discussed possible option of trying a hypo-allergenic formula/supplement like Elecare which would have very limit potential to cause reaction.  She will continue to think about this option especially if weight continues to drop.  We both agree that she should continue to eat regular/solid  foods as much as possible -allergen immunotherapy set to start next Wednesday.  Take antihistamine regimen prior to injections and have epipen with you on days of injections.

## 2019-04-08 ENCOUNTER — Ambulatory Visit: Payer: No Typology Code available for payment source | Admitting: Allergy

## 2019-04-08 ENCOUNTER — Ambulatory Visit: Payer: No Typology Code available for payment source

## 2019-04-09 ENCOUNTER — Ambulatory Visit: Payer: No Typology Code available for payment source | Admitting: Allergy

## 2019-04-15 ENCOUNTER — Ambulatory Visit: Payer: Self-pay

## 2019-04-15 ENCOUNTER — Ambulatory Visit (INDEPENDENT_AMBULATORY_CARE_PROVIDER_SITE_OTHER): Payer: No Typology Code available for payment source | Admitting: Allergy

## 2019-04-15 ENCOUNTER — Encounter: Payer: Self-pay | Admitting: Allergy

## 2019-04-15 ENCOUNTER — Other Ambulatory Visit: Payer: Self-pay

## 2019-04-15 VITALS — BP 120/74 | HR 95 | Resp 17

## 2019-04-15 DIAGNOSIS — J4540 Moderate persistent asthma, uncomplicated: Secondary | ICD-10-CM | POA: Diagnosis not present

## 2019-04-15 DIAGNOSIS — J3089 Other allergic rhinitis: Secondary | ICD-10-CM

## 2019-04-15 DIAGNOSIS — T781XXD Other adverse food reactions, not elsewhere classified, subsequent encounter: Secondary | ICD-10-CM

## 2019-04-15 DIAGNOSIS — T7840XD Allergy, unspecified, subsequent encounter: Secondary | ICD-10-CM

## 2019-04-15 DIAGNOSIS — J309 Allergic rhinitis, unspecified: Secondary | ICD-10-CM | POA: Diagnosis not present

## 2019-04-15 NOTE — Progress Notes (Signed)
Immunotherapy   Patient Details  Name: [name] [name] MRN: [id number] Date of Birth: 08-05-1986  04/15/2019  [name] [name] started injections for  POLLEN-PET, MITE-MOLD Following schedule: A  Frequency:1 time per week Epi-Pen: Yes Consent signed and patient instructions given. Patient received .69mL of Pollen-Pet in RUA and .66mL of Mite-Mold in LUA. Patient waited 30 minutes and did not experience any issues.    Ashleigh Fernandez-Vernon 04/15/2019, 8:45 AM

## 2019-04-15 NOTE — Progress Notes (Signed)
Follow-up Note  RE: [name] Dauerlysa Casciano MRN: [id number] DOB: 02-07-1986 Date of Office Visit: 04/15/2019   History of present illness: [name] [name] is a 33 y.o. female presenting today for follow-up of allergic reactions, food allergy, OAS, reactive airway.  Her last appointment was on 04/01/19 tele-visit.   She presents today to start on allergy injections.  She states everything has been stable since her tele-visit.  She has continued to eat foods she knows are safe for her.  She had gained 2 lbs which she is happy about.  She states she had not left the house in about 2 weeks and thus has not had any further cough and SOB issues or need to use albuterol.   She took zyrtec and famotidine last night and hydroxyzine this morning and thus is a bit sleepy. She states she has not eaten breakfast today yet.    Review of systems: Review of Systems  Constitutional: Negative for chills, fever and malaise/fatigue.  HENT: Negative for congestion, ear discharge, nosebleeds and sore throat.   Eyes: Negative for pain, discharge and redness.  Respiratory: Negative for cough, shortness of breath and wheezing.   Cardiovascular: Negative for chest pain.  Gastrointestinal: Negative for abdominal pain, constipation, diarrhea, heartburn, nausea and vomiting.  Musculoskeletal: Negative for joint pain.  Skin: Negative for itching and rash.  Neurological: Negative for headaches.    All other systems negative unless noted above in HPI  Past medical/social/surgical/family history have been reviewed and are unchanged unless specifically indicated below.  No changes  Medication List: Allergies as of 04/15/2019      Reactions   Benadryl [diphenhydramine]    Chest Pain   Other    Tree nuts    Peanut-containing Drug Products Anaphylaxis   Corn-containing Products    Soy Allergy    Tobramycin Other (See Comments)   Caused dizziness   Ketoconazole Rash      Medication List       Accurate as of Apr 15, 2019  9:20 AM. Always use your most recent med list.        albuterol 108 (90 Base) MCG/ACT inhaler Commonly known as:  ProAir HFA Inhale 2 puffs into the lungs every 6 (six) hours as needed for wheezing or shortness of breath. With spacer   cetirizine 10 MG tablet Commonly known as:  ZyrTEC Allergy Take 1 tablet (10 mg total) by mouth daily.   EPINEPHrine 0.3 mg/0.3 mL Soaj injection Commonly known as:  EPI-PEN Inject 0.3 mLs (0.3 mg total) into the muscle as needed for anaphylaxis.   famotidine 40 MG tablet Commonly known as:  PEPCID Take 1 tablet (40 mg total) by mouth 2 (two) times daily.   Fluocinolone Acetonide Scalp 0.01 % Oil APP TO THE SCALP ONCE D   fluticasone 110 MCG/ACT inhaler Commonly known as:  Flovent HFA Inhale 2 puffs into the lungs 2 (two) times daily as needed.   fluticasone 50 MCG/ACT nasal spray Commonly known as:  FLONASE Place 1 spray into both nostrils as needed for allergies or rhinitis.   hydrOXYzine 10 MG/5ML syrup Commonly known as:  ATARAX Take 5 mLs (10 mg total) by mouth 3 (three) times daily as needed.   nitrofurantoin (macrocrystal-monohydrate) 100 MG capsule Commonly known as:  MACROBID Take 1 tablet after intercourse   omeprazole 20 MG capsule Commonly known as:  PRILOSEC Take 1 capsule (20 mg total) by mouth daily.   ondansetron 8 MG disintegrating tablet Commonly known as:  Zofran  ODT Take 1 tablet (8 mg total) by mouth every 8 (eight) hours as needed for nausea or vomiting.   Selenium Sulfide 2.3 % Sham 1 APPLICATION TO SCALP APPLY D PRN FOR DRANDUFF   Spacer/Aero-Holding [employer] Use as directed with albuterol hfa inhaler.       Known medication allergies: Allergies  Allergen Reactions  . Benadryl [Diphenhydramine]     Chest Pain  . Other     Tree nuts   . Peanut-Containing Drug Products Anaphylaxis  . Corn-Containing Products   . Soy Allergy   . Tobramycin Other (See Comments)    Caused dizziness  .  Ketoconazole Rash    Physical examination: Blood pressure 120/74, pulse 95, resp. rate 17, SpO2 98 %.  General: Alert, interactive, in no acute distress. HEENT: PERRLA, TMs pearly gray, turbinates moderately edematous without discharge, post-pharynx non erythematous. Neck: Supple without lymphadenopathy. Lungs: Clear to auscultation without wheezing, rhonchi or rales. {no increased work of breathing. CV: Normal S1, S2 without murmurs. Abdomen: Nondistended, nontender. Skin: Warm and dry, without lesions or rashes. Extremities:  No clubbing, cyanosis or edema. Neuro:   Grossly intact.  Diagnositics/Labs: Allergen immunotherapy started today.  About 30 min after injection she did complain about a fleeting abdominal pain with N/V.  By the time of reexam the abdominal pain had resolved.  No treatment was needed.  She was able to d/c home in good condition.  Assessment and plan: Allergic reaction/Food allergy  Allergic rhinitis with OAS Reactive airway, allergen driven   - Continue medications below:                A.  Dymista 1 spray each nostril twice a day or Flonase (if dymisata unavailable) 1-2 sprays each nostril daily for nasal congestion/drainage               B.  Albuterol inhaler 2 puffs every 4 hours as needed for cough, wheeze or shortness of breath    C.  Flovent 2 puffs twice a day                C.  Zyrtec  at bedtime and as needed during the day               E.  Famotidine 20 mg  as needed    F.  Hydroxyzine 10 mg - 1-2 tablets up to 3 times a day as needed for allergic reaction    H. Have access to your Epipen    I. Use prednisone  -  as needed for allergic reaction management   - allergen immunotherapy (allergy shots) started today.  Continue weekly injections during build-up phase and bring your Epipen on days of your injections.   - serum IgE to foods recently tested for showed detectable IgE levels to apple, peach, pear, soybean. Blueberry,  grape, banana, spinach, cauliflower, celery, lettuce, broccoli, cabbage, cucumber, green pea, tomato, carrot, white potato, green bean, onion, bean, pumpkin, coconut, sage IgE (chia seed representative) are negative - Continue to keep track of reactions and note any triggers.  - will hold off on Xolair for now as appeared to worsen respiratory symptoms - continue working with nutrition on diet plan  - discussed MVI options that are safe to use in food allergic patients.  If needed we can custom make a [facility] at [facility].   - second-opinion allergy appt scheduled for 04/29/19 with Dr. Ellie Lunch  - ENT, GI and cardiology evaluations has been reassuring to-date.  She has had reassuring labs and imaging to rule-out pancreatitis or gallbladder disease -We have previously discussed possible option of trying a hypo-allergenic formula/supplement like Elecare which would have very limit potential to cause reaction.  She will continue to think about this option especially if weight continues to drop.  We both agree that she should continue to eat regular/solid foods as much as possible  I appreciate the opportunity to take part in Joyelle's care. Please do not hesitate to contact me with questions.  Sincerely,   Margo Aye, MD Allergy/Immunology [facility]

## 2019-04-15 NOTE — Patient Instructions (Addendum)
Allergic reaction/Food allergy  Allergic rhinitis with OAS Reactive airway, allergen driven   - Continue medications below:                A.  Dymista 1 spray each nostril twice a day or Flonase (if dymisata unavailable) 1-2 sprays each nostril daily for nasal congestion/drainage               B.  Albuterol inhaler 2 puffs every 4 hours as needed for cough, wheeze or shortness of breath    C.  Flovent 2 puffs twice a day                C.  Zyrtec 10mg  at bedtime and as needed during the day               E.  Famotidine 20 mg  as needed    F.  Hydroxyzine 10 mg - 1-2 tablets up to 3 times a day as needed for allergic reaction    H. Have access to your Epipen    I. Use prednisone 10mg  -20mg  as needed for allergic reaction management   - allergen immunotherapy (allergy shots) started today.  Continue weekly injections during build-up phase and bring your Epipen on days of your injections.   Will take zyrtec night prior and famotidine and hydroxyzine day of injection at this time.   - serum IgE to foods recently tested for showed detectable IgE levels to apple, peach, pear, soybean. Blueberry, grape, banana, spinach, cauliflower, celery, lettuce, broccoli, cabbage, cucumber, green pea, tomato, carrot, white potato, green bean, onion, bean, pumpkin, coconut, sage IgE (chia seed representative) are negative - Continue to keep track of reactions and note any triggers.  - will hold off on Xolair for now as appeared to worsen respiratory symptoms - continue working with nutrition on diet plan  - discussed MVI options that are safe to use in food allergic patients.  If needed we can custom make a [facility] at [facility].   - second-opinion allergy appt scheduled for 04/29/19 with Dr. Ellie Lunch  - ENT, GI and cardiology evaluations has been reassuring to-date.  She has had reassuring labs and imaging to rule-out pancreatitis or gallbladder disease -We have previously discussed possible option of  trying a hypo-allergenic formula/supplement like Elecare which would have very limit potential to cause reaction.  She will continue to think about this option especially if weight continues to drop.  We both agree that she should continue to eat regular/solid foods as much as possible

## 2019-04-22 ENCOUNTER — Ambulatory Visit (INDEPENDENT_AMBULATORY_CARE_PROVIDER_SITE_OTHER): Payer: No Typology Code available for payment source | Admitting: *Deleted

## 2019-04-22 DIAGNOSIS — J309 Allergic rhinitis, unspecified: Secondary | ICD-10-CM

## 2019-04-28 ENCOUNTER — Ambulatory Visit (INDEPENDENT_AMBULATORY_CARE_PROVIDER_SITE_OTHER): Payer: No Typology Code available for payment source

## 2019-04-28 DIAGNOSIS — J309 Allergic rhinitis, unspecified: Secondary | ICD-10-CM | POA: Diagnosis not present

## 2019-05-01 ENCOUNTER — Telehealth: Payer: Self-pay | Admitting: Allergy

## 2019-05-01 NOTE — Telephone Encounter (Signed)
Patient is out of work due to [employer] Was having weekly visits until patient was seen at [facility] Patient has now had her consult at [facility] Wants to speak about this and how to continue her FMLA and appts Since she will be required to go back to work soon Please call

## 2019-05-01 NOTE — Telephone Encounter (Signed)
If she would like to come in for OV next Wednesday at 11 or 11:30 can offer and she can get her allergy shot at the visit

## 2019-05-01 NOTE — Telephone Encounter (Signed)
Please advise 

## 2019-05-01 NOTE — Telephone Encounter (Signed)
Spoke with patient, offered her an appointment on Wednesday, May 06, 2019 at 1130.  Pt was okay with this, call ended.

## 2019-05-06 ENCOUNTER — Other Ambulatory Visit: Payer: Self-pay

## 2019-05-06 ENCOUNTER — Ambulatory Visit (INDEPENDENT_AMBULATORY_CARE_PROVIDER_SITE_OTHER): Payer: No Typology Code available for payment source | Admitting: Allergy

## 2019-05-06 ENCOUNTER — Encounter: Payer: Self-pay | Admitting: Allergy

## 2019-05-06 VITALS — BP 110/72 | HR 90 | Resp 16

## 2019-05-06 DIAGNOSIS — K219 Gastro-esophageal reflux disease without esophagitis: Secondary | ICD-10-CM | POA: Diagnosis not present

## 2019-05-06 DIAGNOSIS — J3089 Other allergic rhinitis: Secondary | ICD-10-CM | POA: Diagnosis not present

## 2019-05-06 DIAGNOSIS — T781XXD Other adverse food reactions, not elsewhere classified, subsequent encounter: Secondary | ICD-10-CM

## 2019-05-06 DIAGNOSIS — T7840XD Allergy, unspecified, subsequent encounter: Secondary | ICD-10-CM | POA: Diagnosis not present

## 2019-05-06 MED ORDER — OMEPRAZOLE 40 MG PO CPDR: 40.0000 mg | DELAYED_RELEASE_CAPSULE | Freq: Every day | ORAL | 1 refills | Status: DC

## 2019-05-06 MED FILL — OMEPRAZOLE 40 MG CPDR: 40 | 30 days supply | Qty: 30 | Fill #0

## 2019-05-06 NOTE — Patient Instructions (Addendum)
Allergic reaction/Food allergy  Allergic rhinitis with OAS GERD Reactive airway, allergen driven   - Continue medications below:                A.  Dymista 1 spray each nostril twice a day or Flonase (if dymisata unavailable) 1-2 sprays each nostril daily for nasal congestion/drainage               B.  Albuterol inhaler 2 puffs every 4 hours as needed for cough, wheeze or shortness of breath    C.  Flovent 2 puffs twice a day                C.  Zyrtec 10mg  at bedtime and as needed during the day               E.  Famotidine 20 mg  as needed    F.  Hydroxyzine 10 mg - 1-2 tablets up to 3 times a day as needed for allergic reaction    H. Have access to your Epipen    I. Use prednisone 10mg  -20mg  as needed for allergic reaction management     J. Omeprazole 40mg  daily for next 6 weeks - prescribed corn-free option  - Continue allergen immunotherapy (allergy shots) weekly injections.  Bring your Epipen on days of your injections.   Continue antihistamine pre-medication regimen prior to allergy shot - Continue to keep track of reactions and note any triggers.  - will hold off on Xolair for now and will consider re-try during summer (outside of tree pollen season) - will try to arrange for another nutrition visit - add MVI   - will await results and further recommendations from Dr. Ellie Lunch - ENT, GI and cardiology evaluations has been reassuring to-date.

## 2019-05-06 NOTE — Progress Notes (Signed)
Follow-up Note  RE: [name] [name] MRN: [id number] DOB: Dec 09, 1986 Date of Office Visit: 05/06/2019   History of present illness: [name] [name] is a 33 y.o. female presenting today for follow-up of allergic reactions, food allergy with OAS.  She was last seen in the office on 04/15/19.  Since this visit she did see Dr. Ellie Lunch at [facility] allergy on 04/29/19 and is undergoing further evaluation of food allergy with component testing.  Ivory also states she was advised to continue to expand her diet however she tried to put rice back into the diet and states she ate a bite (~ 1 tablespoon) of white rice cooked with oil and salt and after 5 minutes developed "all over hives".   She also states she tried to drink hot chocolate made with boiled lactose free milk and she immediately had "explosive diarrhea".   She also has had increase in hair shedding.  She also has not started MVI yet but states she is planning to start a  MVI.   She does feel like she has a component of reflux and wants to start omeprazole.  Review of systems: Review of Systems  Constitutional: Positive for malaise/fatigue and weight loss. Negative for chills and fever.  HENT: Negative for congestion, ear discharge, nosebleeds and sore throat.   Eyes: Negative for pain, discharge and redness.  Respiratory: Negative for cough, shortness of breath and wheezing.   Cardiovascular: Negative for chest pain.  Gastrointestinal: Positive for diarrhea and heartburn. Negative for constipation, nausea and vomiting.  Musculoskeletal: Negative for joint pain.  Skin: Positive for itching and rash.  Neurological: Negative for headaches.    All other systems negative unless noted above in HPI  Past medical/social/surgical/family history have been reviewed and are unchanged unless specifically indicated below.  No changes  Medication List: Allergies as of 05/06/2019      Reactions   Benadryl [diphenhydramine]    Chest Pain   Other    Tree  nuts    Peanut-containing Drug Products Anaphylaxis   Corn-containing Products    Soy Allergy    Tobramycin Other (See Comments)   Caused dizziness   Ketoconazole Rash      Medication List       Accurate as of May 06, 2019  1:41 PM. If you have any questions, ask your nurse or doctor.        albuterol 108 (90 Base) MCG/ACT inhaler Commonly known as:  ProAir HFA Inhale 2 puffs into the lungs every 6 (six) hours as needed for wheezing or shortness of breath. With spacer   cetirizine 10 MG tablet Commonly known as:  ZyrTEC Allergy Take 1 tablet (10 mg total) by mouth daily.   EPINEPHrine 0.3 mg/0.3 mL Soaj injection Commonly known as:  EPI-PEN Inject 0.3 mLs (0.3 mg total) into the muscle as needed for anaphylaxis.   famotidine 40 MG tablet Commonly known as:  PEPCID Take 1 tablet (40 mg total) by mouth 2 (two) times daily. What changed:    when to take this  reasons to take this   Fluocinolone Acetonide Scalp 0.01 % Oil APP TO THE SCALP ONCE D   fluticasone 110 MCG/ACT inhaler Commonly known as:  Flovent HFA Inhale 2 puffs into the lungs 2 (two) times daily as needed.   fluticasone 50 MCG/ACT nasal spray Commonly known as:  FLONASE Place 1 spray into both nostrils as needed for allergies or rhinitis.   hydrOXYzine 10 MG/5ML syrup Commonly known as:  ATARAX Take  5 mLs (10 mg total) by mouth 3 (three) times daily as needed.   nitrofurantoin (macrocrystal-monohydrate) 100 MG capsule Commonly known as:  MACROBID Take 1 tablet after intercourse   omeprazole 40 MG capsule Commonly known as:  PRILOSEC Take 1 capsule (40 mg total) by mouth daily. What changed:    medication strength  how much to take Changed by:   Larose Hires, MD   ondansetron 8 MG disintegrating tablet Commonly known as:  Zofran ODT Take 1 tablet (8 mg total) by mouth every 8 (eight) hours as needed for nausea or vomiting.   Selenium Sulfide 2.3 % Sham 1 APPLICATION TO SCALP  APPLY D PRN FOR DRANDUFF   Spacer/Aero-Holding [employer] Use as directed with albuterol hfa inhaler.       Known medication allergies: Allergies  Allergen Reactions  . Benadryl [Diphenhydramine]     Chest Pain  . Other     Tree nuts   . Peanut-Containing Drug Products Anaphylaxis  . Corn-Containing Products   . Soy Allergy   . Tobramycin Other (See Comments)    Caused dizziness  . Ketoconazole Rash    Physical examination: Blood pressure 110/72, pulse 90, resp. rate 16, SpO2 98 %.  General: Alert, interactive, in no acute distress. HEENT: PERRLA, TMs pearly gray, turbinates mildly edematous without discharge, post-pharynx non erythematous. Neck: Supple without lymphadenopathy. Lungs: Clear to auscultation without wheezing, rhonchi or rales. {no increased work of breathing. CV: Normal S1, S2 without murmurs. Abdomen: Nondistended, nontender. Skin: Warm and dry, without lesions or rashes. Extremities:  No clubbing, cyanosis or edema. Neuro:   Grossly intact.  Diagnositics/Labs: None today  Assessment and plan: Patient Instructions  Allergic reaction/Food allergy  Allergic rhinitis with OAS Reactive airway, allergen driven   - Continue medications below:                A.  Dymista 1 spray each nostril twice a day or Flonase (if dymisata unavailable) 1-2 sprays each nostril daily for nasal congestion/drainage               B.  Albuterol inhaler 2 puffs every 4 hours as needed for cough, wheeze or shortness of breath    C.  Flovent 2 puffs twice a day                C.  Zyrtec 10mg  at bedtime and as needed during the day               E.  Famotidine 20 mg  as needed    F.  Hydroxyzine 10 mg - 1-2 tablets up to 3 times a day as needed for allergic reaction    H. Have access to your Epipen    I. Use prednisone 10mg  -20mg  as needed for allergic reaction management     J. Omeprazole 40mg  daily for next 6 weeks - prescribed corn-free option  - Continue  allergen immunotherapy (allergy shots) weekly injections.  Bring your Epipen on days of your injections.   Continue antihistamine pre-medication regimen prior to allergy shot - Continue to keep track of reactions and note any triggers.  - will hold off on Xolair for now and will consider re-try during summer (outside of tree pollen season) - will try to arrange for another nutrition visit - add MVI   - will await results and further recommendations from Dr. Ellie Lunch - ENT, GI and cardiology evaluations has been reassuring to-date.   I appreciate the opportunity to take  part in Katy's care. Please do not hesitate to contact me with questions.  Sincerely,   Margo AyeShaylar , MD Allergy/Immunology [facility]

## 2019-05-07 NOTE — Telephone Encounter (Signed)
I have printed notes for the dates specified and faxed to Dr. Modesto Charon.

## 2019-05-13 ENCOUNTER — Ambulatory Visit (INDEPENDENT_AMBULATORY_CARE_PROVIDER_SITE_OTHER): Payer: No Typology Code available for payment source

## 2019-05-13 DIAGNOSIS — J3089 Other allergic rhinitis: Secondary | ICD-10-CM | POA: Diagnosis not present

## 2019-05-14 ENCOUNTER — Other Ambulatory Visit: Payer: Self-pay | Admitting: Family Medicine

## 2019-05-14 DIAGNOSIS — R0989 Other specified symptoms and signs involving the circulatory and respiratory systems: Secondary | ICD-10-CM

## 2019-05-14 DIAGNOSIS — T887XXA Unspecified adverse effect of drug or medicament, initial encounter: Secondary | ICD-10-CM

## 2019-05-21 ENCOUNTER — Ambulatory Visit (INDEPENDENT_AMBULATORY_CARE_PROVIDER_SITE_OTHER): Payer: No Typology Code available for payment source | Admitting: *Deleted

## 2019-05-21 DIAGNOSIS — J309 Allergic rhinitis, unspecified: Secondary | ICD-10-CM | POA: Diagnosis not present

## 2019-05-22 ENCOUNTER — Encounter: Payer: Self-pay | Admitting: Allergy

## 2019-05-22 ENCOUNTER — Other Ambulatory Visit: Payer: Self-pay

## 2019-05-22 ENCOUNTER — Ambulatory Visit (INDEPENDENT_AMBULATORY_CARE_PROVIDER_SITE_OTHER): Payer: No Typology Code available for payment source | Admitting: Allergy

## 2019-05-22 VITALS — BP 108/70 | HR 90 | Temp 97.5°F | Resp 16

## 2019-05-22 DIAGNOSIS — J3089 Other allergic rhinitis: Secondary | ICD-10-CM

## 2019-05-22 DIAGNOSIS — T7840XD Allergy, unspecified, subsequent encounter: Secondary | ICD-10-CM | POA: Diagnosis not present

## 2019-05-22 DIAGNOSIS — T781XXD Other adverse food reactions, not elsewhere classified, subsequent encounter: Secondary | ICD-10-CM | POA: Diagnosis not present

## 2019-05-22 DIAGNOSIS — K219 Gastro-esophageal reflux disease without esophagitis: Secondary | ICD-10-CM | POA: Diagnosis not present

## 2019-05-22 NOTE — Progress Notes (Signed)
Follow-up Note  RE: [name] [name] MRN: [id number] DOB: Mar 04, 1986 Date of Office Visit: 05/22/2019   History of present illness: [name] [name] is a 33 y.o. female presenting today for follow-up of allergic reaction with allergic rhinitis with OAS.  She was last seen in office on 05/06/2019 by myself.  Since this time she states that white potato caused to have what felt like esophageal spasm.  She denies having the throat globus sensation but instead felt the food was stuck in chest.  She only has select number of foods that she hasn't had any issues eating including salmon, chicken, blueberries.  She states with grapes they also started feeling like they were getting stuck in her chest.   She is going to start taking a allergy safe MVI however it was shipped and left in her mailbox in the heat for several days thus she is trying to return this shipment in exchange for another bottle.  Also thinking of starting cod liver oil.   She is planning to resume work on Monday of next week.  She received her allergen injection yesterday and has been doing well with the injections without any large local or systemic reactions.  Review of systems: Review of Systems  Constitutional: Positive for malaise/fatigue and weight loss. Negative for chills and fever.  HENT: Negative for congestion, ear discharge, nosebleeds and sore throat.   Eyes: Negative for pain, discharge and redness.  Respiratory: Negative for cough, shortness of breath and wheezing.   Cardiovascular: Negative for chest pain.  Gastrointestinal: Positive for heartburn. Negative for abdominal pain, constipation, diarrhea, nausea and vomiting.  Musculoskeletal: Negative for joint pain.  Skin: Negative for itching and rash.  Neurological: Negative for headaches.    All other systems negative unless noted above in HPI  Past medical/social/surgical/family history have been reviewed and are unchanged unless specifically indicated below.   No changes  Medication List: Allergies as of 05/22/2019      Reactions   Benadryl [diphenhydramine]    Chest Pain   Other    Tree nuts    Peanut-containing Drug Products Anaphylaxis   Corn-containing Products    Soy Allergy    Tobramycin Other (See Comments)   Caused dizziness   Ketoconazole Rash      Medication List       Accurate as of May 22, 2019  4:17 PM. If you have any questions, ask your nurse or doctor.        albuterol 108 (90 Base) MCG/ACT inhaler Commonly known as: ProAir HFA Inhale 2 puffs into the lungs every 6 (six) hours as needed for wheezing or shortness of breath. With spacer   cetirizine 10 MG tablet Commonly known as: ZyrTEC Allergy Take 1 tablet (10 mg total) by mouth daily.   EPINEPHrine 0.3 mg/0.3 mL Soaj injection Commonly known as: EPI-PEN Inject 0.3 mLs (0.3 mg total) into the muscle as needed for anaphylaxis.   famotidine 40 MG tablet Commonly known as: PEPCID Take 1 tablet (40 mg total) by mouth 2 (two) times daily. What changed:   when to take this  reasons to take this   Fluocinolone Acetonide Scalp 0.01 % Oil APP TO THE SCALP ONCE D   fluticasone 110 MCG/ACT inhaler Commonly known as: Flovent HFA Inhale 2 puffs into the lungs 2 (two) times daily as needed.   fluticasone 50 MCG/ACT nasal spray Commonly known as: FLONASE Place 1 spray into both nostrils as needed for allergies or rhinitis.   hydrOXYzine 10  MG/5ML syrup Commonly known as: ATARAX Take 5 mLs (10 mg total) by mouth 3 (three) times daily as needed.   nitrofurantoin (macrocrystal-monohydrate) 100 MG capsule Commonly known as: MACROBID Take 1 tablet after intercourse   omeprazole 40 MG capsule Commonly known as: PRILOSEC Take 1 capsule (40 mg total) by mouth daily.   ondansetron 8 MG disintegrating tablet Commonly known as: Zofran ODT Take 1 tablet (8 mg total) by mouth every 8 (eight) hours as needed for nausea or vomiting.   Selenium Sulfide 2.3 % Sham  1 APPLICATION TO SCALP APPLY D PRN FOR DRANDUFF   Spacer/Aero-Holding [employer] Use as directed with albuterol hfa inhaler.       Known medication allergies: Allergies  Allergen Reactions  . Benadryl [Diphenhydramine]     Chest Pain  . Other     Tree nuts   . Peanut-Containing Drug Products Anaphylaxis  . Corn-Containing Products   . Soy Allergy   . Tobramycin Other (See Comments)    Caused dizziness  . Ketoconazole Rash     Physical examination: Blood pressure 108/70, pulse 90, temperature (!) 97.5 F (36.4 C), temperature source Temporal, resp. rate 16, SpO2 98 %.  General: Alert, interactive, in no acute distress. HEENT: PERRLA, TMs pearly gray, turbinates minimally edematous without discharge, post-pharynx non erythematous. Neck: Supple without lymphadenopathy. Lungs: Clear to auscultation without wheezing, rhonchi or rales. {no increased work of breathing. CV: Normal S1, S2 without murmurs. Abdomen: Nondistended, nontender. Skin: Warm and dry, without lesions or rashes. Extremities:  No clubbing, cyanosis or edema. Neuro:   Grossly intact.  Diagnositics/Labs: None today  Assessment and plan: Patient Instructions  Allergic reaction/Food allergy  Allergic rhinitis with OAS GERD Reactive airway, allergen driven   - Continue medications below:                A.  Dymista 1 spray each nostril twice a day or Flonase (if dymisata unavailable) 1-2 sprays each nostril daily for nasal congestion/drainage               B.  Albuterol inhaler 2 puffs every 4 hours as needed for cough, wheeze or shortness of breath    C.  Flovent 2 puffs twice a day                C.  Zyrtec 10mg  at bedtime and as needed during the day               E.  Famotidine 20 mg  as needed    F.  Hydroxyzine 10 mg - 1-2 tablets up to 3 times a day as needed for allergic reaction    H. Have access to your Epipen    I. Use prednisone 10mg  -20mg  as needed for allergic reaction  management     J. Omeprazole 40mg  daily for 6 week duration - prescribed corn-free option  - Continue allergen immunotherapy (allergy shots) weekly injections.  Bring your Epipen on days of your injections.   Continue antihistamine pre-medication regimen prior to allergy shot - Continue to keep track of reactions and note any triggers.  - will hold off on Xolair for now and will consider re-try during summer (outside of tree pollen season) - can look into local [place] registered dietician, Gae Bon, RD     [email or web address] - add MVI   - will await results and further recommendations from Dr. Ellie Lunch - ENT, GI and cardiology evaluations has been reassuring to-date.   I appreciate the  opportunity to take part in [place] care. Please do not hesitate to contact me with questions.  Sincerely,   Margo Aye, MD Allergy/Immunology [facility]

## 2019-05-22 NOTE — Patient Instructions (Addendum)
Allergic reaction/Food allergy  Allergic rhinitis with OAS GERD Reactive airway, allergen driven   - Continue medications below:                A.  Dymista 1 spray each nostril twice a day or Flonase (if dymisata unavailable) 1-2 sprays each nostril daily for nasal congestion/drainage               B.  Albuterol inhaler 2 puffs every 4 hours as needed for cough, wheeze or shortness of breath    C.  Flovent 194mcg 2 puffs twice a day                C.  Zyrtec 10mg  at bedtime and as needed during the day               E.  Famotidine 20 mg  as needed    F.  Hydroxyzine 10 mg - 1-2 tablets up to 3 times a day as needed for allergic reaction    H. Have access to your Epipen    I. Use prednisone 10mg  -20mg  as needed for allergic reaction management     J. Omeprazole 40mg  daily for 6 week duration - prescribed corn-free option  - Continue allergen immunotherapy (allergy shots) weekly injections.  Bring your Epipen on days of your injections.   Continue antihistamine pre-medication regimen prior to allergy shot - Continue to keep track of reactions and note any triggers.  - will hold off on Xolair for now and will consider re-try during summer (outside of tree pollen season) - can look into local [place] registered dietician, Yvonna Alanis, RD     [email or web address] - add MVI   - will await results and further recommendations from Dr. Lloyd Huger - ENT, GI and cardiology evaluations has been reassuring to-date.

## 2019-06-01 ENCOUNTER — Ambulatory Visit (INDEPENDENT_AMBULATORY_CARE_PROVIDER_SITE_OTHER): Payer: No Typology Code available for payment source

## 2019-06-01 DIAGNOSIS — J309 Allergic rhinitis, unspecified: Secondary | ICD-10-CM | POA: Diagnosis not present

## 2019-06-02 ENCOUNTER — Encounter: Payer: Self-pay | Admitting: Allergy

## 2019-06-02 ENCOUNTER — Other Ambulatory Visit: Payer: Self-pay

## 2019-06-02 ENCOUNTER — Ambulatory Visit (INDEPENDENT_AMBULATORY_CARE_PROVIDER_SITE_OTHER): Payer: No Typology Code available for payment source | Admitting: Allergy

## 2019-06-02 ENCOUNTER — Ambulatory Visit: Payer: No Typology Code available for payment source

## 2019-06-02 VITALS — BP 104/68 | HR 92 | Temp 98.3°F | Resp 16 | Wt 121.0 lb

## 2019-06-02 DIAGNOSIS — K219 Gastro-esophageal reflux disease without esophagitis: Secondary | ICD-10-CM | POA: Diagnosis not present

## 2019-06-02 DIAGNOSIS — J3089 Other allergic rhinitis: Secondary | ICD-10-CM | POA: Diagnosis not present

## 2019-06-02 DIAGNOSIS — J4540 Moderate persistent asthma, uncomplicated: Secondary | ICD-10-CM

## 2019-06-02 DIAGNOSIS — T781XXD Other adverse food reactions, not elsewhere classified, subsequent encounter: Secondary | ICD-10-CM

## 2019-06-02 DIAGNOSIS — T7840XD Allergy, unspecified, subsequent encounter: Secondary | ICD-10-CM

## 2019-06-02 NOTE — Patient Instructions (Signed)
Allergic reaction/Food allergy  Allergic rhinitis with OAS GERD Reactive airway, allergen driven   - Continue medications below:                A.  Dymista 1 spray each nostril twice a day or Flonase (if dymisata unavailable) 1-2 sprays each nostril daily for nasal congestion/drainage               B.  Albuterol inhaler 2 puffs every 4 hours as needed for cough, wheeze or shortness of breath    C.  Flovent 125mcg 2 puffs twice a day                C.  Zyrtec 10mg  at bedtime and as needed during the day               E.  Famotidine 20 mg  as needed    F.  Hydroxyzine 10 mg - 1-2 tablets up to 3 times a day as needed for allergic reaction    H. Have access to your Epipen    I. Use prednisone 10mg  -20mg  as needed for allergic reaction management     J. Omeprazole 40mg  daily for 6 week duration - prescribed corn-free option  - Continue allergen immunotherapy (allergy shots) weekly injections.  Bring your Epipen on days of your injections.   Continue antihistamine pre-medication regimen prior to allergy shot - Continue to keep track of reactions and note any triggers.  - will hold off on Xolair for now and will consider re-try during summer (outside of tree pollen season) - look into local [place] registered dietician, Yvonna Alanis, RD     [email or web address]  - will await results and further recommendations from Dr. Lloyd Huger - ENT, GI and cardiology evaluations has been reassuring to-date.   - will perform drug observation challenge with MVI in-office tomorrow, 06/03/19

## 2019-06-02 NOTE — Progress Notes (Signed)
Follow-up Note  RE: [name] [name] MRN: [id number] DOB: 04-15-1986 Date of Office Visit: 06/02/2019   History of present illness: [name] [name] is a 33 y.o. female presenting today for follow-up of allergic reaction with allergic rhinitis and OAS.  She was last seen in the office on 05/22/2019 by myself.  Since this visit she did return to work last week with reduced schedule of 6hr shift and worked M,T, Th of last week.  She reports last week was very eventful for her as well as stressful.  She reports in the past week she lost 5 more pounds and developed diarrhea over the weekend.   She also reports developed itchy throat after eating.  She does not feel that she can physically work at this time.   She also missed her allergy shot last week as well due to her symptoms.  She states it is very difficult to work as she needs to be able to control when and where she eats and she is not able to do that at work.    Review of systems: Review of Systems  Constitutional: Positive for malaise/fatigue and weight loss. Negative for chills and fever.  HENT: Positive for congestion, nosebleeds and sore throat. Negative for ear discharge and sinus pain.   Eyes: Negative for pain, discharge and redness.  Respiratory: Negative for cough, shortness of breath and wheezing.   Cardiovascular: Negative for chest pain.  Gastrointestinal: Positive for diarrhea. Negative for constipation and vomiting.  Musculoskeletal: Negative for joint pain.  Skin: Negative for itching and rash.  Neurological: Negative for headaches.    All other systems negative unless noted above in HPI  Past medical/social/surgical/family history have been reviewed and are unchanged unless specifically indicated below.  No changes  Medication List: Allergies as of 06/02/2019      Reactions   Benadryl [diphenhydramine]    Chest Pain   Other    Tree nuts    Peanut-containing Drug Products Anaphylaxis   Corn-containing Products    Soy Allergy    Tobramycin Other (See Comments)   Caused dizziness   Ketoconazole Rash      Medication List       Accurate as of June 02, 2019  2:24 PM. If you have any questions, ask your nurse or doctor.        albuterol 108 (90 Base) MCG/ACT inhaler Commonly known as: ProAir HFA Inhale 2 puffs into the lungs every 6 (six) hours as needed for wheezing or shortness of breath. With spacer   cetirizine 10 MG tablet Commonly known as: ZyrTEC Allergy Take 1 tablet (10 mg total) by mouth daily.   EPINEPHrine 0.3 mg/0.3 mL Soaj injection Commonly known as: EPI-PEN Inject 0.3 mLs (0.3 mg total) into the muscle as needed for anaphylaxis.   famotidine 40 MG tablet Commonly known as: PEPCID Take 1 tablet (40 mg total) by mouth 2 (two) times daily. What changed:   when to take this  reasons to take this   Fluocinolone Acetonide Scalp 0.01 % Oil APP TO THE SCALP ONCE D   fluticasone 110 MCG/ACT inhaler Commonly known as: Flovent HFA Inhale 2 puffs into the lungs 2 (two) times daily as needed.   fluticasone 50 MCG/ACT nasal spray Commonly known as: FLONASE Place 1 spray into both nostrils as needed for allergies or rhinitis.   hydrOXYzine 10 MG/5ML syrup Commonly known as: ATARAX Take 5 mLs (10 mg total) by mouth 3 (three) times daily as needed.   nitrofurantoin (  macrocrystal-monohydrate) 100 MG capsule Commonly known as: MACROBID Take 1 tablet after intercourse   omeprazole 40 MG capsule Commonly known as: PRILOSEC Take 1 capsule (40 mg total) by mouth daily.   ondansetron 8 MG disintegrating tablet Commonly known as: Zofran ODT Take 1 tablet (8 mg total) by mouth every 8 (eight) hours as needed for nausea or vomiting.   Selenium Sulfide 2.3 % Sham 1 APPLICATION TO SCALP APPLY D PRN FOR DRANDUFF   Spacer/Aero-Holding [employer] Use as directed with albuterol hfa inhaler.       Known medication allergies: Allergies  Allergen Reactions  . Benadryl  [Diphenhydramine]     Chest Pain  . Other     Tree nuts   . Peanut-Containing Drug Products Anaphylaxis  . Corn-Containing Products   . Soy Allergy   . Tobramycin Other (See Comments)    Caused dizziness  . Ketoconazole Rash     Physical examination: Blood pressure 104/68, pulse 92, temperature 98.3 F (36.8 C), resp. rate 16, weight 121 lb (54.9 kg), SpO2 98 %.  Limited exam -  General: Alert, interactive, in no acute distress, appears thinner Skin: Warm and dry, without lesions or rashes. Extremities:  No clubbing, cyanosis or edema. Neuro:   Grossly intact.  Diagnositics/Labs: None today  Assessment and plan:   Allergic reaction/Food allergy  Allergic rhinitis with OAS GERD Reactive airway, allergen driven   - Continue medications below:                A.  Dymista 1 spray each nostril twice a day or Flonase (if dymisata unavailable) 1-2 sprays each nostril daily for nasal congestion/drainage               B.  Albuterol inhaler 2 puffs every 4 hours as needed for cough, wheeze or shortness of breath    C.  Flovent 2 puffs twice a day                C.  Zyrtec 10mg  at bedtime and as needed during the day               E.  Famotidine 20 mg  as needed    F.  Hydroxyzine 10 mg - 1-2 tablets up to 3 times a day as needed for allergic reaction    H. Have access to your Epipen    I. Use prednisone 10mg  -20mg  as needed for allergic reaction management     J. Omeprazole 40mg  daily for 6 week duration - prescribed corn-free option  - Continue allergen immunotherapy (allergy shots) weekly injections.  Bring your Epipen on days of your injections.   Continue antihistamine pre-medication regimen prior to allergy shot - Continue to keep track of reactions and note any triggers.  - will hold off on Xolair for now and will consider re-try during summer (outside of tree pollen season) - look into local [place] registered dietician, Gae Bon, RD      [email or web address]  - will await results and further recommendations from Dr. Ellie Lunch - ENT, GI and cardiology evaluations has been reassuring to-date.  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  Her degree of weight loss over the past 6 months and poor diet is very concerning to me.  It also is likely  leading to malnourishment.  She will hopefully tolerate the MVI challenge to be able to get daily requirements of vitamins and nutrients.  I have provided with information for a different registered dietician locally so that her diet can be further evaluated and hopefully expanded to incorporate key nutrients and improve weight.  She will continue on allergy shots for long-term control of allergies with hope that her OAS and food reactions will resolve.  She is still in build-up phase of allergy shots at this time.    I appreciate the opportunity to take part in [name] [name]'s care. Please do not hesitate to contact me with questions.  Sincerely,   Margo Aye, MD Allergy/Immunology [facility]

## 2019-06-03 ENCOUNTER — Encounter: Payer: Self-pay | Admitting: Allergy

## 2019-06-03 ENCOUNTER — Ambulatory Visit (INDEPENDENT_AMBULATORY_CARE_PROVIDER_SITE_OTHER): Payer: No Typology Code available for payment source | Admitting: Allergy

## 2019-06-03 ENCOUNTER — Other Ambulatory Visit: Payer: Self-pay

## 2019-06-03 VITALS — BP 108/80 | HR 88 | Temp 98.9°F | Resp 16 | Ht 62.0 in | Wt 124.6 lb

## 2019-06-03 DIAGNOSIS — J3089 Other allergic rhinitis: Secondary | ICD-10-CM

## 2019-06-03 DIAGNOSIS — T7840XD Allergy, unspecified, subsequent encounter: Secondary | ICD-10-CM | POA: Diagnosis not present

## 2019-06-03 DIAGNOSIS — K219 Gastro-esophageal reflux disease without esophagitis: Secondary | ICD-10-CM

## 2019-06-03 DIAGNOSIS — T781XXD Other adverse food reactions, not elsewhere classified, subsequent encounter: Secondary | ICD-10-CM

## 2019-06-03 NOTE — Patient Instructions (Addendum)
Allergic reaction/Food allergy  Allergic rhinitis with OAS GERD Reactive airway, allergen driven   - Continue medications below:                A.  Dymista 1 spray each nostril twice a day or Flonase (if dymisata unavailable) 1-2 sprays each nostril daily for nasal congestion/drainage               B.  Albuterol inhaler 2 puffs every 4 hours as needed for cough, wheeze or shortness of breath    C.  Flovent 124mcg 2 puffs twice a day                C.  Zyrtec 10mg  at bedtime and as needed during the day               E.  Famotidine 20 mg  as needed    F.  Hydroxyzine 10 mg - 1-2 tablets up to 3 times a day as needed for allergic reaction    H. Have access to your Epipen    I. Use prednisone 10mg  -20mg  as needed for allergic reaction management     J. Omeprazole 40mg  daily for 6 week duration - prescribed corn-free option  - Multivitamin observation challenge performed today and did well without any adverse symptoms.  Will continue twice a day dosing at this time.   - Continue allergen immunotherapy (allergy shots) weekly injections.  Bring your Epipen on days of your injections.   Continue antihistamine pre-medication regimen prior to allergy shot - Continue to keep track of reactions and note any triggers.  - will hold off on Xolair for now and will consider re-try during summer (outside of tree pollen season) - look into local [place] registered dietician, Yvonna Alanis, RD     [email or web address]  - will await results and further recommendations from Dr. Lloyd Huger - ENT, GI and cardiology evaluations has been reassuring to-date.

## 2019-06-03 NOTE — Progress Notes (Signed)
Follow-up Note  RE: [name] [name] MRN: [id number] DOB: 1986/10/22 Date of Office Visit: 06/03/2019   History of present illness: [name] [name] is a 33 y.o. female presenting today for medication observation challenge.  I saw her yesterday, 06/03/19 for office visit and she brought in a MVI that she would like to start.  However she is nervous to try new foods/medications at home for fear of possible reaction at home.  Thus offered to provide the medication in office under observation.  She has not had any antihistamines today.  She did bring in coconut yogurt as the MVI is to be taken with food.    Review of systems: Review of Systems  Constitutional: Positive for malaise/fatigue and weight loss. Negative for chills and fever.  HENT: Positive for nosebleeds and sore throat. Negative for congestion and ear discharge.   Eyes: Negative for pain, discharge and redness.  Respiratory: Negative for cough, shortness of breath and wheezing.   Cardiovascular: Negative for chest pain.  Gastrointestinal: Negative for abdominal pain, constipation, diarrhea, heartburn, nausea and vomiting.  Musculoskeletal: Negative for joint pain.  Skin: Negative for itching and rash.  Neurological: Negative for headaches.    All other systems negative unless noted above in HPI  Past medical/social/surgical/family history have been reviewed and are unchanged unless specifically indicated below.  No changes  Medication List: Allergies as of 06/03/2019      Reactions   Benadryl [diphenhydramine]    Chest Pain   Other    Tree nuts    Peanut-containing Drug Products Anaphylaxis   Corn-containing Products    Soy Allergy    Tobramycin Other (See Comments)   Caused dizziness   Ketoconazole Rash      Medication List       Accurate as of June 03, 2019 10:57 AM. If you have any questions, ask your nurse or doctor.        albuterol 108 (90 Base) MCG/ACT inhaler Commonly known as: ProAir HFA Inhale 2  puffs into the lungs every 6 (six) hours as needed for wheezing or shortness of breath. With spacer   cetirizine 10 MG tablet Commonly known as: ZyrTEC Allergy Take 1 tablet (10 mg total) by mouth daily.   EPINEPHrine 0.3 mg/0.3 mL Soaj injection Commonly known as: EPI-PEN Inject 0.3 mLs (0.3 mg total) into the muscle as needed for anaphylaxis.   famotidine 40 MG tablet Commonly known as: PEPCID Take 1 tablet (40 mg total) by mouth 2 (two) times daily. What changed:   when to take this  reasons to take this   Fluocinolone Acetonide Scalp 0.01 % Oil APP TO THE SCALP ONCE D   fluticasone 110 MCG/ACT inhaler Commonly known as: Flovent HFA Inhale 2 puffs into the lungs 2 (two) times daily as needed.   fluticasone 50 MCG/ACT nasal spray Commonly known as: FLONASE Place 1 spray into both nostrils as needed for allergies or rhinitis.   hydrOXYzine 10 MG/5ML syrup Commonly known as: ATARAX Take 5 mLs (10 mg total) by mouth 3 (three) times daily as needed.   nitrofurantoin (macrocrystal-monohydrate) 100 MG capsule Commonly known as: MACROBID Take 1 tablet after intercourse   omeprazole 40 MG capsule Commonly known as: PRILOSEC Take 1 capsule (40 mg total) by mouth daily.   ondansetron 8 MG disintegrating tablet Commonly known as: Zofran ODT Take 1 tablet (8 mg total) by mouth every 8 (eight) hours as needed for nausea or vomiting.   Selenium Sulfide 2.3 % Sham 1 APPLICATION  TO SCALP APPLY D PRN FOR DRANDUFF   Spacer/Aero-Holding [employer] Use as directed with albuterol hfa inhaler.       Known medication allergies: Allergies  Allergen Reactions  . Benadryl [Diphenhydramine]     Chest Pain  . Other     Tree nuts   . Peanut-Containing Drug Products Anaphylaxis  . Corn-Containing Products   . Soy Allergy   . Tobramycin Other (See Comments)    Caused dizziness  . Ketoconazole Rash     Physical examination: Blood pressure 108/80, pulse 88, temperature  98.9 F (37.2 C), resp. rate 16, height 5\' 2"  (1.575 m), weight 124 lb 9.6 oz (56.5 kg), SpO2 98 %.  General: Alert, interactive, in no acute distress. HEENT: PERRLA, TMs pearly gray, turbinates mildly edematous without discharge, post-pharynx non erythematous. Neck: Supple without lymphadenopathy. Lungs: Clear to auscultation without wheezing, rhonchi or rales. {no increased work of breathing. CV: Normal S1, S2 without murmurs. Abdomen: Nondistended, nontender. Skin: Warm and dry, without lesions or rashes. Extremities:  No clubbing, cyanosis or edema. Neuro:   Grossly intact.  Diagnositics/Labs: Multi [employer] brand - 1 capsule was taken and she was observed for 1 hour following ingestion.  She tolerated this well without any adverse symptoms.  Vitals were stable on discharge.     Assessment and plan:   Allergic reaction/Food allergy  Allergic rhinitis with OAS GERD Reactive airway, allergen driven   - Continue medications below:                A.  Dymista 1 spray each nostril twice a day or Flonase (if dymisata unavailable) 1-2 sprays each nostril daily for nasal congestion/drainage               B.  Albuterol inhaler 2 puffs every 4 hours as needed for cough, wheeze or shortness of breath    C.  Flovent 2 puffs twice a day                C.  Zyrtec 10mg  at bedtime and as needed during the day               E.  Famotidine 20 mg  as needed    F.  Hydroxyzine 10 mg - 1-2 tablets up to 3 times a day as needed for allergic reaction    H. Have access to your Epipen    I. Use prednisone 10mg  -20mg  as needed for allergic reaction management     J. Omeprazole 40mg  daily for 6 week duration - prescribed corn-free option  - Multivitamin observation challenge performed today and did well without any adverse symptoms.  Will continue twice a day dosing at this time.   - Continue allergen immunotherapy (allergy shots) weekly injections.  Bring your Epipen on  days of your injections.   Continue antihistamine pre-medication regimen prior to allergy shot - Continue to keep track of reactions and note any triggers.  - will hold off on Xolair for now and will consider re-try during summer (outside of tree pollen season) - look into local [place] registered dietician, Gae Bon, RD     [email or web address]  - will await results and further recommendations from Dr. Ellie Lunch - ENT, GI and cardiology evaluations has been reassuring to-date.   I appreciate the opportunity to take part in Allannah's care. Please do not hesitate to contact me with questions.  Sincerely,   Margo Aye, MD Allergy/Immunology [facility]

## 2019-06-08 ENCOUNTER — Ambulatory Visit (INDEPENDENT_AMBULATORY_CARE_PROVIDER_SITE_OTHER): Payer: No Typology Code available for payment source | Admitting: *Deleted

## 2019-06-08 DIAGNOSIS — J309 Allergic rhinitis, unspecified: Secondary | ICD-10-CM

## 2019-06-17 ENCOUNTER — Ambulatory Visit (INDEPENDENT_AMBULATORY_CARE_PROVIDER_SITE_OTHER): Payer: No Typology Code available for payment source | Admitting: Allergy

## 2019-06-17 ENCOUNTER — Encounter: Payer: Self-pay | Admitting: Allergy

## 2019-06-17 ENCOUNTER — Telehealth: Payer: Self-pay | Admitting: Allergy

## 2019-06-17 ENCOUNTER — Other Ambulatory Visit: Payer: Self-pay

## 2019-06-17 VITALS — BP 108/70 | HR 88 | Temp 97.7°F | Resp 16

## 2019-06-17 DIAGNOSIS — R634 Abnormal weight loss: Secondary | ICD-10-CM

## 2019-06-17 DIAGNOSIS — T7840XD Allergy, unspecified, subsequent encounter: Secondary | ICD-10-CM

## 2019-06-17 DIAGNOSIS — K219 Gastro-esophageal reflux disease without esophagitis: Secondary | ICD-10-CM

## 2019-06-17 DIAGNOSIS — J3089 Other allergic rhinitis: Secondary | ICD-10-CM | POA: Diagnosis not present

## 2019-06-17 DIAGNOSIS — T781XXD Other adverse food reactions, not elsewhere classified, subsequent encounter: Secondary | ICD-10-CM | POA: Diagnosis not present

## 2019-06-17 DIAGNOSIS — R002 Palpitations: Secondary | ICD-10-CM

## 2019-06-17 NOTE — Telephone Encounter (Signed)
A nurse from [employer] called about disability questions on this patient.

## 2019-06-17 NOTE — Progress Notes (Signed)
Follow-up Note  RE: [name] [name] MRN: [id number] DOB: 07-21-1986 Date of Office Visit: 06/17/2019   History of present illness: [name] Dauerlysa Georg is a 33 y.o. female presenting today for follow-up of allergic reactions, allergic rhinitis with oral allergy syndrome, GERD.  She was last seen in the office on June 03, 2019 at which time she tolerated a multivitamin challenge.  She states she has been taking the multivitamin twice a day until 2 to 3 days ago when she stopped.  She states she actually stopped all of her medications at that time as she was having palpitations in the evenings.  She is unsure if it is related to anxiety or if it is related to the medications.  She had been tolerating a multivitamin otherwise.  She states the palpitations even woke her up from her sleep at one point.  She has seen cardiology in the past and had EKG and echo was performed.  She does plan to follow-up with them regarding the palpitations that she currently has been having. She states she has been frustrated this past week as she has lost another 6 pounds.  She states she is eating 3 meals a day and 2 snacks that consist of grapes and blueberries.  She states she started back eating porch with her meals to try to get more carbs in her diet.  She plans to resume string beans in the diet.  She also wants to try putting sweet potato back in the diet. She states she did develop hives within the past week and she did take Zyrtec in May that resolved.  She is unsure as to what the trigger of the hives were. She did look into the local dietitian however she does not take her insurance and would be to costly.   She is concerned that something other than allergy is related to her continued weight loss.  She also is concerned with her caloric intake.   She states she has continued to have hair loss.  She denies diarrhea.  She has had another type of rash that looked like a dry patch however has self-resolved.    Review of  systems: Review of Systems  Constitutional: Negative for chills, fever and malaise/fatigue.  HENT: Negative for congestion, ear discharge, nosebleeds and sore throat.   Eyes: Negative for pain, discharge and redness.  Respiratory: Negative for cough, shortness of breath and wheezing.   Cardiovascular: Negative for chest pain.  Gastrointestinal: Negative for abdominal pain, constipation, diarrhea, heartburn, nausea and vomiting.  Musculoskeletal: Negative for joint pain.  Skin: Positive for itching and rash.  Neurological: Negative for headaches.    All other systems negative unless noted above in HPI  Past medical/social/surgical/family history have been reviewed and are unchanged unless specifically indicated below.  No changes  Medication List: Allergies as of 06/17/2019      Reactions   Benadryl [diphenhydramine]    Chest Pain   Other    Tree nuts    Peanut-containing Drug Products Anaphylaxis   Corn-containing Products    Soy Allergy    Tobramycin Other (See Comments)   Caused dizziness   Ketoconazole Rash      Medication List       Accurate as of June 17, 2019  4:21 PM. If you have any questions, ask your nurse or doctor.        albuterol 108 (90 Base) MCG/ACT inhaler Commonly known as: ProAir HFA Inhale 2 puffs into the lungs every 6 (six)  hours as needed for wheezing or shortness of breath. With spacer   cetirizine 10 MG tablet Commonly known as: ZyrTEC Allergy Take 1 tablet (10 mg total) by mouth daily.   EPINEPHrine 0.3 mg/0.3 mL Soaj injection Commonly known as: EPI-PEN Inject 0.3 mLs (0.3 mg total) into the muscle as needed for anaphylaxis.   famotidine 40 MG tablet Commonly known as: PEPCID Take 1 tablet (40 mg total) by mouth 2 (two) times daily. What changed:   when to take this  reasons to take this   Fluocinolone Acetonide Scalp 0.01 % Oil APP TO THE SCALP ONCE D   fluticasone 110 MCG/ACT inhaler Commonly known as: Flovent HFA Inhale 2  puffs into the lungs 2 (two) times daily as needed.   fluticasone 50 MCG/ACT nasal spray Commonly known as: FLONASE Place 1 spray into both nostrils as needed for allergies or rhinitis.   hydrOXYzine 10 MG/5ML syrup Commonly known as: ATARAX Take 5 mLs (10 mg total) by mouth 3 (three) times daily as needed.   nitrofurantoin (macrocrystal-monohydrate) 100 MG capsule Commonly known as: MACROBID Take 1 tablet after intercourse   omeprazole 40 MG capsule Commonly known as: PRILOSEC Take 1 capsule (40 mg total) by mouth daily.   ondansetron 8 MG disintegrating tablet Commonly known as: Zofran ODT Take 1 tablet (8 mg total) by mouth every 8 (eight) hours as needed for nausea or vomiting.   Selenium Sulfide 2.3 % Sham 1 APPLICATION TO SCALP APPLY D PRN FOR DRANDUFF   Spacer/Aero-Holding [employer] Use as directed with albuterol hfa inhaler.       Known medication allergies: Allergies  Allergen Reactions  . Benadryl [Diphenhydramine]     Chest Pain  . Other     Tree nuts   . Peanut-Containing Drug Products Anaphylaxis  . Corn-Containing Products   . Soy Allergy   . Tobramycin Other (See Comments)    Caused dizziness  . Ketoconazole Rash     Physical examination: Blood pressure 108/70, pulse 88, temperature 97.7 F (36.5 C), temperature source Temporal, resp. rate 16, SpO2 97 %.  General: Alert, interactive, in no acute distress, does appear thinner than prior. HEENT: PERRLA, TMs pearly gray, turbinates non-edematous without discharge, post-pharynx non erythematous. Neck: Supple without lymphadenopathy. Lungs: Clear to auscultation without wheezing, rhonchi or rales. {no increased work of breathing. CV: Normal S1, S2 without murmurs. Abdomen: Nondistended, nontender. Skin: Warm and dry, without lesions or rashes. Extremities:  No clubbing, cyanosis or edema. Neuro:   Grossly intact.  Diagnositics/Labs: None today  Assessment and plan:   Allergic  reaction/Food allergy  Allergic rhinitis with OAS GERD Weight loss Palpitations Reactive airway, allergen driven   - Continue medications below:                A.  Dymista 1 spray each nostril twice a day or Flonase (if dymisata unavailable) 1-2 sprays each nostril daily for nasal congestion/drainage               B.  Albuterol inhaler 2 puffs every 4 hours as needed for cough, wheeze or shortness of breath    C.  Flovent 110mcg 2 puffs twice a day               D.  Zyrtec 10mg  at bedtime and as needed during the day               E.  Famotidine 20 mg  as needed    F.  Hydroxyzine 10  mg - 1-2 tablets up to 3 times a day as needed for allergic reaction    H. Have access to your Epipen    I. Use prednisone 10mg  -20mg  as needed for allergic reaction management     J. Omeprazole 40mg  daily for 6 week duration - prescribed corn-free option  - Resume use of Multivitamin twice a day - agree with returning to GI for work-up for digestive disorders with continued weight loss and malnourished state.  Will need assistance from dietician with meal plan for adequate caloric intake and essential nutrient/vitamins - Continue allergen immunotherapy (allergy shots) weekly injections.  Bring your Epipen on days of your injections.   Continue antihistamine pre-medication regimen prior to allergy shot - Continue to keep track of reactions and note any triggers.  - will hold off on Xolair for now and will consider re-try during summer (outside of tree pollen season) - will await results and further recommendations from Dr. Lloyd Huger - ENT, GI and cardiology evaluations has been reassuring to-date.  Re-visit with cardiology for recurrent palpitations.   I appreciate the opportunity to take part in Elantra's care. Please do not hesitate to contact me with questions.  Sincerely,   Prudy Feeler, MD Allergy/Immunology [facility]

## 2019-06-17 NOTE — Telephone Encounter (Signed)
[name] [name]- Questions regarding patient's disability because food allergies are not necessarily considered a disability, so to speak. She needs you to call her back for further discussion.

## 2019-06-17 NOTE — Patient Instructions (Addendum)
Allergic reaction/Food allergy  Allergic rhinitis with OAS GERD Weight loss Palpitations Reactive airway, allergen driven   - Continue medications below:                A.  Dymista 1 spray each nostril twice a day or Flonase (if dymisata unavailable) 1-2 sprays each nostril daily for nasal congestion/drainage               B.  Albuterol inhaler 2 puffs every 4 hours as needed for cough, wheeze or shortness of breath    C.  Flovent 171mcg 2 puffs twice a day                D.  Zyrtec 10mg  at bedtime and as needed during the day               E.  Famotidine 20 mg  as needed    F.  Hydroxyzine 10 mg - 1-2 tablets up to 3 times a day as needed for allergic reaction    H. Have access to your Epipen    I. Use prednisone 10mg  -20mg  as needed for allergic reaction management     J. Omeprazole 40mg  daily for 6 week duration - prescribed corn-free option  - Resume use of Multivitamin twice a day - agree with returning to GI for work-up for digestive disorders with continued weight loss and malnourished state.  Will need assistance from dietician with meal plan for adequate caloric intake and essential nutrient/vitamins - Continue allergen immunotherapy (allergy shots) weekly injections.  Bring your Epipen on days of your injections.   Continue antihistamine pre-medication regimen prior to allergy shot - Continue to keep track of reactions and note any triggers.  - will hold off on Xolair for now and will consider re-try during summer (outside of tree pollen season) - will await results and further recommendations from Dr. Lloyd Huger - ENT, GI and cardiology evaluations has been reassuring to-date.  Re-visit with cardiology for recurrent palpitations.

## 2019-06-19 ENCOUNTER — Telehealth: Payer: Self-pay

## 2019-06-19 ENCOUNTER — Other Ambulatory Visit: Payer: No Typology Code available for payment source

## 2019-06-19 NOTE — Telephone Encounter (Signed)
FMLA forms have been faxed to [phone].

## 2019-06-22 NOTE — Telephone Encounter (Signed)
Almyra Free from [employer] called again. She sates she really needs to talk to the doctor about the patients food allergies as this is not considered a disability to stay out of work.   Please Advise.   Phone # [phone]

## 2019-06-23 ENCOUNTER — Ambulatory Visit (INDEPENDENT_AMBULATORY_CARE_PROVIDER_SITE_OTHER): Payer: No Typology Code available for payment source | Admitting: *Deleted

## 2019-06-23 ENCOUNTER — Ambulatory Visit
Admission: RE | Admit: 2019-06-23 | Discharge: 2019-06-23 | Disposition: A | Discharge status: Home / Self Care | Payer: No Typology Code available for payment source | Source: Ambulatory Visit | Attending: Family Medicine | Admitting: Family Medicine

## 2019-06-23 DIAGNOSIS — T887XXA Unspecified adverse effect of drug or medicament, initial encounter: Secondary | ICD-10-CM

## 2019-06-23 DIAGNOSIS — J309 Allergic rhinitis, unspecified: Secondary | ICD-10-CM

## 2019-06-23 DIAGNOSIS — R0989 Other specified symptoms and signs involving the circulatory and respiratory systems: Secondary | ICD-10-CM

## 2019-06-23 NOTE — Telephone Encounter (Signed)
Ok I will give a call when I have a break in patients.

## 2019-06-23 NOTE — Telephone Encounter (Signed)
I would hold off on scan as symptoms are improving. -VRP

## 2019-06-23 NOTE — Telephone Encounter (Signed)
Called [name] [name] today at 9:05 and LVM.  Will attempt to reach again later today.

## 2019-06-23 NOTE — Telephone Encounter (Signed)
[name] [name] faxed over notes

## 2019-06-24 DIAGNOSIS — J3089 Other allergic rhinitis: Secondary | ICD-10-CM | POA: Diagnosis not present

## 2019-06-26 ENCOUNTER — Encounter: Payer: Self-pay | Admitting: Allergy

## 2019-06-26 ENCOUNTER — Ambulatory Visit (INDEPENDENT_AMBULATORY_CARE_PROVIDER_SITE_OTHER): Payer: No Typology Code available for payment source | Admitting: Allergy

## 2019-06-26 ENCOUNTER — Other Ambulatory Visit: Payer: Self-pay

## 2019-06-26 VITALS — BP 102/76 | HR 86 | Temp 98.6°F | Resp 16 | Ht 62.0 in | Wt 121.0 lb

## 2019-06-26 DIAGNOSIS — K219 Gastro-esophageal reflux disease without esophagitis: Secondary | ICD-10-CM

## 2019-06-26 DIAGNOSIS — T7840XD Allergy, unspecified, subsequent encounter: Secondary | ICD-10-CM

## 2019-06-26 DIAGNOSIS — R002 Palpitations: Secondary | ICD-10-CM

## 2019-06-26 DIAGNOSIS — T781XXD Other adverse food reactions, not elsewhere classified, subsequent encounter: Secondary | ICD-10-CM

## 2019-06-26 DIAGNOSIS — J3089 Other allergic rhinitis: Secondary | ICD-10-CM | POA: Diagnosis not present

## 2019-06-26 DIAGNOSIS — R634 Abnormal weight loss: Secondary | ICD-10-CM

## 2019-06-26 NOTE — Patient Instructions (Signed)
Allergic reaction/Food allergy  Allergic rhinitis with OAS GERD Weight loss Palpitations Reactive airway, allergen driven   - Continue medications below:                A.  Dymista 1 spray each nostril twice a day or Flonase (if dymisata unavailable) 1-2 sprays each nostril daily for nasal congestion/drainage               B.  Albuterol inhaler 2 puffs every 4 hours as needed for cough, wheeze or shortness of breath    C.  Flovent 161mcg 2 puffs twice a day                D.  Zyrtec 10mg  at bedtime and as needed during the day               E.  Famotidine 20 mg  as needed    F.  Hydroxyzine 10 mg - 1-2 tablets up to 3 times a day as needed for allergic reaction    H. Have access to your Epipen    I. Use prednisone 10mg  -20mg  as needed for allergic reaction management     J. Omeprazole 40mg  daily for 6 week duration - prescribed corn-free option  -Continue multivitamin twice a day - agree with returning to GI for work-up for digestive disorders with continued weight loss and malnourished state.  Will need assistance from dietician with meal plan for adequate caloric intake and essential nutrient/vitamins - Re-visit with cardiology for recurrent palpitations.  - Continue allergen immunotherapy (allergy shots) weekly injections.  Bring your Epipen on days of your injections.   Continue antihistamine pre-medication regimen prior to allergy shot - Continue to keep track of reactions and note any triggers.  - will hold off on Xolair for now and will consider re-try during summer (outside of tree pollen season) - will await results and further recommendations from Dr. Lloyd Huger - ENT, GI and cardiology evaluations has been reassuring to-date.

## 2019-06-26 NOTE — Progress Notes (Signed)
Follow-up Note  RE: [name] Dauerlysa Hockman MRN: [id number] DOB: [birth year] Date of Office Visit: 06/26/2019   History of present illness: [name] [name] is a 33 y.o. female presenting today for follow-up of allergic reaction, allergic rhinitis with pollen food allergy syndrome, GERD.  She was last seen in the office on 06/17/2019 by myself.  She states she has lost further weight as her diet continues to remain quite limited.  She states she has had some sort of reactions over the last 2 to 3 months following trying the following foods, protein powder, bananas, white potatoes, rice, rice flour, quinoa, cassava root, cassava flour, oats, oatmeal, oat flour, bacon.  Thus these foods she has removed from her diet at this time.  She states she recently was able to tolerate cocoa like if she were making chocolate milk recently had a reaction following this as well.  She states that egg she was previously tolerating but this week she developed full body hives and abdominal pain after ingestion. She continues to be quite concerned with her continued weight loss and limited diet.  She is setting up a follow-up with her GI to discuss this weight loss and make sure that she is receiving the appropriate nutrients with the foods that are in her diet.  We also hope that there is a Therapist, musicqualified dietitian that works with the GI that can also assist in meal planning.  She has been to [facility] however per the patient they were not equipped to deal with the magnitude of her food allergy issues. She also has follow-up with her cardiologist due to return of palpitations.  Of good news she continues to tolerate allergen immunotherapy well without any large local or systemic reactions.   Review of systems: Review of Systems  Constitutional: Positive for malaise/fatigue and weight loss. Negative for chills and fever.  HENT: Negative for congestion, ear discharge, nosebleeds, sinus pain and sore  throat.   Eyes: Negative for pain, discharge and redness.  Respiratory: Negative for cough, shortness of breath and wheezing.   Cardiovascular: Negative for chest pain.  Gastrointestinal: Positive for abdominal pain. Negative for constipation, diarrhea and vomiting.  Musculoskeletal: Negative for joint pain.  Skin: Positive for itching and rash.  Neurological: Negative for dizziness, loss of consciousness and headaches.    All other systems negative unless noted above in HPI  Past medical/social/surgical/family history have been reviewed and are unchanged unless specifically indicated below.  No changes  Medication List: Allergies as of 06/26/2019      Reactions   Benadryl [diphenhydramine]    Chest Pain   Other    Tree nuts    Peanut-containing Drug Products Anaphylaxis   Corn-containing Products    Soy Allergy    Tobramycin Other (See Comments)   Caused dizziness   Ketoconazole Rash      Medication List       Accurate as of June 26, 2019  1:13 PM. If you have any questions, ask your nurse or doctor.        albuterol 108 (90 Base) MCG/ACT inhaler Commonly known as: ProAir HFA Inhale 2 puffs into the lungs every 6 (six) hours as needed for wheezing or shortness of breath. With spacer   cetirizine 10 MG tablet Commonly known as: ZyrTEC Allergy Take 1 tablet (10 mg total) by mouth daily.   EPINEPHrine 0.3 mg/0.3 mL Soaj injection Commonly known as: EPI-PEN Inject 0.3 mLs (0.3 mg total) into the muscle as needed  for anaphylaxis.   famotidine 40 MG tablet Commonly known as: PEPCID Take 1 tablet (40 mg total) by mouth 2 (two) times daily. What changed:   when to take this  reasons to take this   Fluocinolone Acetonide Scalp 0.01 % Oil APP TO THE SCALP ONCE D   fluticasone 110 MCG/ACT inhaler Commonly known as: Flovent HFA Inhale 2 puffs into the lungs 2 (two) times daily as needed.   fluticasone 50 MCG/ACT nasal spray Commonly known as: FLONASE Place 1  spray into both nostrils as needed for allergies or rhinitis.   hydrOXYzine 10 MG/5ML syrup Commonly known as: ATARAX Take 5 mLs (10 mg total) by mouth 3 (three) times daily as needed.   nitrofurantoin (macrocrystal-monohydrate) 100 MG capsule Commonly known as: MACROBID Take 1 tablet after intercourse   omeprazole 40 MG capsule Commonly known as: PRILOSEC Take 1 capsule (40 mg total) by mouth daily.   ondansetron 8 MG disintegrating tablet Commonly known as: Zofran ODT Take 1 tablet (8 mg total) by mouth every 8 (eight) hours as needed for nausea or vomiting.   Selenium Sulfide 2.3 % Sham 1 APPLICATION TO SCALP APPLY D PRN FOR DRANDUFF   Spacer/Aero-Holding [employer] Use as directed with albuterol hfa inhaler.       Known medication allergies: Allergies  Allergen Reactions  . Benadryl [Diphenhydramine]     Chest Pain  . Other     Tree nuts   . Peanut-Containing Drug Products Anaphylaxis  . Corn-Containing Products   . Soy Allergy   . Tobramycin Other (See Comments)    Caused dizziness  . Ketoconazole Rash     Physical examination: Blood pressure 102/76, pulse 86, temperature 98.6 F (37 C), temperature source Temporal, resp. rate 16, height 5\' 2"  (1.575 m), weight 121 lb (54.9 kg), SpO2 97 %.  General: Alert, interactive, in no acute distress. HEENT: TMs pearly gray, turbinates minimally edematous without discharge, post-pharynx non erythematous. Neck: Supple without lymphadenopathy. Lungs: Clear to auscultation without wheezing, rhonchi or rales. {no increased work of breathing. CV: Normal S1, S2 without murmurs. Abdomen: Nondistended, nontender. Skin: Warm and dry, without lesions or rashes. Extremities:  No clubbing, cyanosis or edema. Neuro:   Grossly intact.  Diagnositics/Labs: Thyroid ultrasound 06/23/2019-   IMPRESSION: 1. Punctate (sub 4 mm) right-sided thyroid nodules/cysts, neither of which meet imaging criteria to recommend percutaneous  sampling or continued dedicated follow-up. 2. Otherwise, normal thyroid ultrasound. Specifically, no evidence of thyromegaly.  Assessment and plan:   Allergic reaction/Food allergy  Allergic rhinitis with OAS GERD Weight loss Palpitations Reactive airway, allergen driven   - Continue medications below:                A.  Dymista 1 spray each nostril twice a day or Flonase (if dymisata unavailable) 1-2 sprays each nostril daily for nasal congestion/drainage               B.  Albuterol inhaler 2 puffs every 4 hours as needed for cough, wheeze or shortness of breath    C.  Flovent 132mcg 2 puffs twice a day                D.  Zyrtec 10mg  at bedtime and as needed during the day               E.  Famotidine 20 mg  as needed    F.  Hydroxyzine 10 mg - 1-2 tablets up to 3 times a day as needed  for allergic reaction    H. Have access to your Epipen    I. Use prednisone 10mg  -20mg  as needed for allergic reaction management     J. Omeprazole 40mg  daily for 6 week duration - prescribed corn-free option  -Continue multivitamin twice a day - agree with returning to GI for work-up for digestive disorders with continued weight loss and malnourished state.  Will need assistance from dietician with meal plan for adequate caloric intake and essential nutrient/vitamins - Re-visit with cardiology for recurrent palpitations.  - Continue allergen immunotherapy (allergy shots) weekly injections.  Bring your Epipen on days of your injections.   Continue antihistamine pre-medication regimen prior to allergy shot - Continue to keep track of reactions and note any triggers.  - will hold off on Xolair for now and will consider re-try during summer (outside of tree pollen season) - will await results and further recommendations from Dr. Ellie LunchLugar - ENT, GI and cardiology evaluations has been reassuring to-date.     I appreciate the opportunity to take part in Vala's care. Please do not hesitate to contact me  with questions.  Sincerely,   Margo AyeShaylar , MD Allergy/Immunology [facility]

## 2019-06-30 ENCOUNTER — Ambulatory Visit (INDEPENDENT_AMBULATORY_CARE_PROVIDER_SITE_OTHER): Payer: No Typology Code available for payment source | Admitting: *Deleted

## 2019-06-30 DIAGNOSIS — J309 Allergic rhinitis, unspecified: Secondary | ICD-10-CM | POA: Diagnosis not present

## 2019-07-02 ENCOUNTER — Ambulatory Visit: Payer: Self-pay | Admitting: Family Medicine

## 2019-07-06 ENCOUNTER — Other Ambulatory Visit: Payer: Self-pay

## 2019-07-06 ENCOUNTER — Ambulatory Visit: Payer: Self-pay | Admitting: Family Medicine

## 2019-07-06 ENCOUNTER — Encounter: Payer: Self-pay | Admitting: Family Medicine

## 2019-07-06 ENCOUNTER — Ambulatory Visit (INDEPENDENT_AMBULATORY_CARE_PROVIDER_SITE_OTHER): Payer: No Typology Code available for payment source | Admitting: Family Medicine

## 2019-07-06 VITALS — BP 101/73 | HR 89 | Temp 98.6°F | Ht 62.0 in | Wt 125.6 lb

## 2019-07-06 DIAGNOSIS — R51 Headache: Secondary | ICD-10-CM

## 2019-07-06 DIAGNOSIS — H81399 Other peripheral vertigo, unspecified ear: Secondary | ICD-10-CM

## 2019-07-06 DIAGNOSIS — R2689 Other abnormalities of gait and mobility: Secondary | ICD-10-CM | POA: Diagnosis not present

## 2019-07-06 DIAGNOSIS — G44229 Chronic tension-type headache, not intractable: Secondary | ICD-10-CM

## 2019-07-06 DIAGNOSIS — T781XXD Other adverse food reactions, not elsewhere classified, subsequent encounter: Secondary | ICD-10-CM

## 2019-07-06 DIAGNOSIS — R519 Headache, unspecified: Secondary | ICD-10-CM | POA: Insufficient documentation
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
## 2019-07-06 NOTE — Patient Instructions (Signed)
We will order MRI   Stay well hydrated, optimize healthy diet and regular exercise   Continue close follow up with PCP and specialists as directed  We will follow up pending MRI results.   Dizziness Dizziness is a common problem. It makes you feel unsteady or light-headed. You may feel like you are about to pass out (faint). Dizziness can lead to getting hurt if you stumble or fall. Dizziness can be caused by many things, including:  Medicines.  Not having enough water in your body (dehydration).  Illness. Follow these instructions at home: Eating and drinking   Drink enough fluid to keep your pee (urine) clear or pale yellow. This helps to keep you from getting dehydrated. Try to drink more clear fluids, such as water.  Do not drink alcohol.  Limit how much caffeine you drink or eat, if your doctor tells you to do that.  Limit how much salt (sodium) you drink or eat, if your doctor tells you to do that. Activity   Avoid making quick movements. ? When you stand up from sitting in a chair, steady yourself until you feel okay. ? In the morning, first sit up on the side of the bed. When you feel okay, stand slowly while you hold onto something. Do this until you know that your balance is fine.  If you need to stand in one place for a long time, move your legs often. Tighten and relax the muscles in your legs while you are standing.  Do not drive or use heavy machinery if you feel dizzy.  Avoid bending down if you feel dizzy. Place items in your home so you can reach them easily without leaning over. Lifestyle  Do not use any products that contain nicotine or tobacco, such as cigarettes and e-cigarettes. If you need help quitting, ask your doctor.  Try to lower your stress level. You can do this by using methods such as yoga or meditation. Talk with your doctor if you need help. General instructions  Watch your dizziness for any changes.  Take over-the-counter and  prescription medicines only as told by your doctor. Talk with your doctor if you think that you are dizzy because of a medicine that you are taking.  Tell a friend or a family member that you are feeling dizzy. If he or she notices any changes in your behavior, have this person call your doctor.  Keep all follow-up visits as told by your doctor. This is important. Contact a doctor if:  Your dizziness does not go away.  Your dizziness or light-headedness gets worse.  You feel sick to your stomach (nauseous).  You have trouble hearing.  You have new symptoms.  You are unsteady on your feet.  You feel like the room is spinning. Get help right away if:  You throw up (vomit) or have watery poop (diarrhea), and you cannot eat or drink anything.  You have trouble: ? Talking. ? Walking. ? Swallowing. ? Using your arms, hands, or legs.  You feel generally weak.  You are not thinking clearly, or you have trouble forming sentences. A friend or family member may notice this.  You have: ? Chest pain. ? Pain in your belly (abdomen). ? Shortness of breath. ? Sweating.  Your vision changes.  You are bleeding.  You have a very bad headache.  You have neck pain or a stiff neck.  You have a fever. These symptoms may be an emergency. Do not wait to see  if the symptoms will go away. Get medical help right away. Call your local emergency services (911 in the [place] Do not drive yourself to the hospital. Summary  Dizziness makes you feel unsteady or light-headed. You may feel like you are about to pass out (faint).  Drink enough fluid to keep your pee (urine) clear or pale yellow. Do not drink alcohol.  Avoid making quick movements if you feel dizzy.  Watch your dizziness for any changes. This information is not intended to replace advice given to you by your health care provider. Make sure you discuss any questions you have with your health care provider. Document Released:  11/15/2011 Document Revised: 11/29/2017 Document Reviewed: 12/13/2016 Elsevier Patient Education  [place] Headache Without Cause A headache is pain or discomfort that is felt around the head or neck area. There are many causes and types of headaches. In some cases, the cause may not be found. Follow these instructions at home: Watch your condition for any changes. Let your doctor know about them. Take these steps to help with your condition: Managing pain      Take over-the-counter and prescription medicines only as told by your doctor.  Lie down in a dark, quiet room when you have a headache.  If told, put ice on your head and neck area: ? Put ice in a plastic bag. ? Place a towel between your skin and the bag. ? Leave the ice on for 20 minutes, 2-3 times per day.  If told, put heat on the affected area. Use the heat source that your doctor recommends, such as a moist heat pack or a heating pad. ? Place a towel between your skin and the heat source. ? Leave the heat on for 20-30 minutes. ? Remove the heat if your skin turns bright red. This is very important if you are unable to feel pain, heat, or cold. You may have a greater risk of getting burned.  Keep lights dim if bright lights bother you or make your headaches worse. Eating and drinking  Eat meals on a regular schedule.  If you drink alcohol: ? Limit how much you use to:  0-1 drink a day for women.  0-2 drinks a day for men. ? Be aware of how much alcohol is in your drink. In the [place] one drink equals one 12 oz bottle of beer (355 mL), one 5 oz glass of wine (148 mL), or one 1 oz glass of hard liquor (44 mL).  Stop drinking caffeine, or reduce how much caffeine you drink. General instructions   Keep a journal to find out if certain things bring on headaches. For example, write down: ? What you eat and drink. ? How much sleep you get. ? Any change to your diet or medicines.  Get a  massage or try other ways to relax.  Limit stress.  Sit up straight. Do not tighten (tense) your muscles.  Do not use any products that contain nicotine or tobacco. This includes cigarettes, e-cigarettes, and chewing tobacco. If you need help quitting, ask your doctor.  Exercise regularly as told by your doctor.  Get enough sleep. This often means 7-9 hours of sleep each night.  Keep all follow-up visits as told by your doctor. This is important. Contact a doctor if:  Your symptoms are not helped by medicine.  You have a headache that feels different than the other headaches.  You feel sick to your stomach (nauseous) or  you throw up (vomit).  You have a fever. Get help right away if:  Your headache gets very bad quickly.  Your headache gets worse after a lot of physical activity.  You keep throwing up.  You have a stiff neck.  You have trouble seeing.  You have trouble speaking.  You have pain in the eye or ear.  Your muscles are weak or you lose muscle control.  You lose your balance or have trouble walking.  You feel like you will pass out (faint) or you pass out.  You are mixed up (confused).  You have a seizure. Summary  A headache is pain or discomfort that is felt around the head or neck area.  There are many causes and types of headaches. In some cases, the cause may not be found.  Keep a journal to help find out what causes your headaches. Watch your condition for any changes. Let your doctor know about them.  Contact a doctor if you have a headache that is different from usual, or if your headache is not helped by medicine.  Get help right away if your headache gets very bad, you throw up, you have trouble seeing, you lose your balance, or you have a seizure. This information is not intended to replace advice given to you by your health care provider. Make sure you discuss any questions you have with your health care provider. Document Released:  09/04/2008 Document Revised: 06/16/2018 Document Reviewed: 06/16/2018 Elsevier Patient Education  2020 [employer]

## 2019-07-07 ENCOUNTER — Telehealth: Payer: Self-pay | Admitting: Family Medicine

## 2019-07-07 NOTE — Telephone Encounter (Signed)
[facility] [name]: [id number] (exp. 07/10/19 to 08/09/19)/tricare order sent to GI. They will reach out to the patient to schedule.

## 2019-07-07 NOTE — Progress Notes (Signed)
I reviewed note and agree with plan.   Penni Bombard, MD 2/95/6213, 0:86 AM Certified in Neurology, Neurophysiology and Neuroimaging  [facility] [address] [place], [place] [address] [phone]

## 2019-07-09 ENCOUNTER — Ambulatory Visit (INDEPENDENT_AMBULATORY_CARE_PROVIDER_SITE_OTHER): Payer: No Typology Code available for payment source

## 2019-07-09 DIAGNOSIS — J309 Allergic rhinitis, unspecified: Secondary | ICD-10-CM | POA: Diagnosis not present

## 2019-07-13 ENCOUNTER — Other Ambulatory Visit: Payer: Self-pay

## 2019-07-13 ENCOUNTER — Ambulatory Visit: Payer: No Typology Code available for payment source | Attending: Family Medicine

## 2019-07-13 DIAGNOSIS — M6281 Muscle weakness (generalized): Secondary | ICD-10-CM | POA: Diagnosis not present
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
## 2019-07-13 NOTE — Patient Instructions (Signed)
Access Code: [facility]  URL: [email or web address]  Date: 07/13/2019  Prepared by: Sigurd Sos   Exercises Supine Active Straight Leg Raise - 10 reps - 2 sets - 2x daily - 7x weekly Supine Bridge - 10 reps - 2 sets - 5 hold - 7x weekly Seated Long Arc Quad - 10 reps - 2 sets - 5 hold - 2x daily - 7x weekly Seated Shoulder Abduction - Palms Down - 10 reps - 2 sets - 2x daily - 7x weekly Seated Shoulder Flexion - 10 reps - 2 sets - 2x daily - 7x weekly Seated Shoulder Scaption - 10 reps - 2 sets - 2x daily - 7x weekly

## 2019-07-14 ENCOUNTER — Ambulatory Visit (INDEPENDENT_AMBULATORY_CARE_PROVIDER_SITE_OTHER): Payer: No Typology Code available for payment source | Admitting: *Deleted

## 2019-07-14 DIAGNOSIS — J309 Allergic rhinitis, unspecified: Secondary | ICD-10-CM

## 2019-07-14 IMAGING — US ULTRASOUND ABDOMEN LIMITED
1 series · 14 of 25 positions shown · non-contrast
Comparison: None.

CLINICAL DATA: Upper abdominal pain

EXAM:
ULTRASOUND ABDOMEN LIMITED RIGHT UPPER QUADRANT

[Series 1: ultrasound abdomen limited · 0.15mm/px · 14 of 49 slices shown]
[im 1/49]
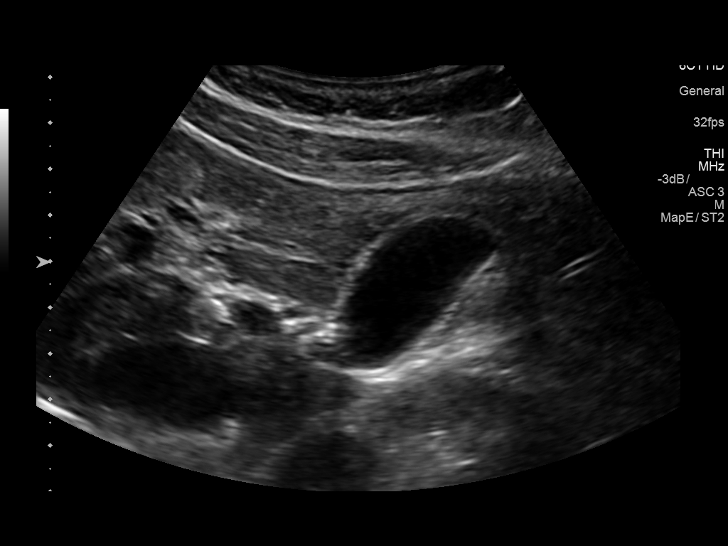
[im 5/49]
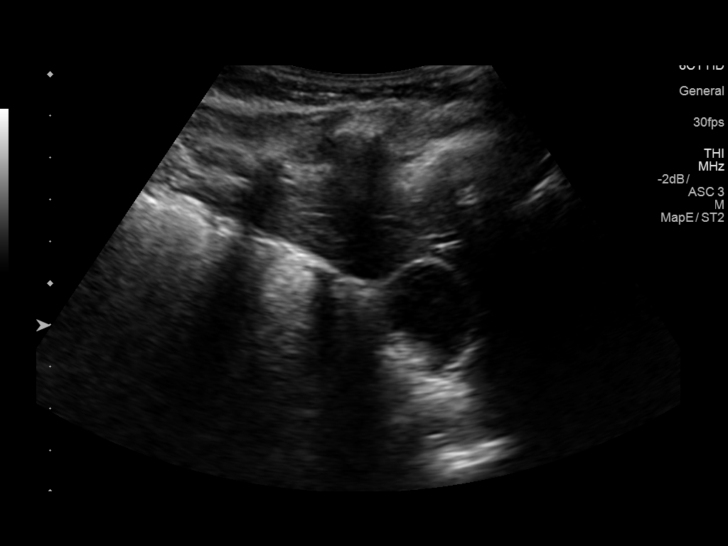
[im 9/49]
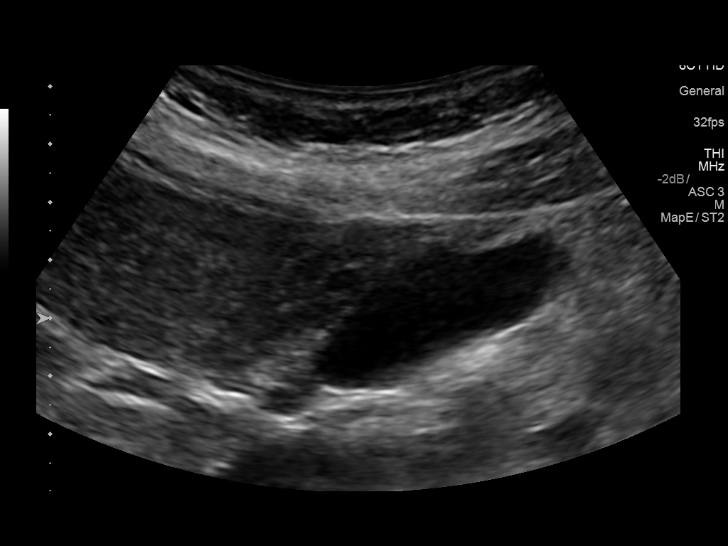
[im 13/49]
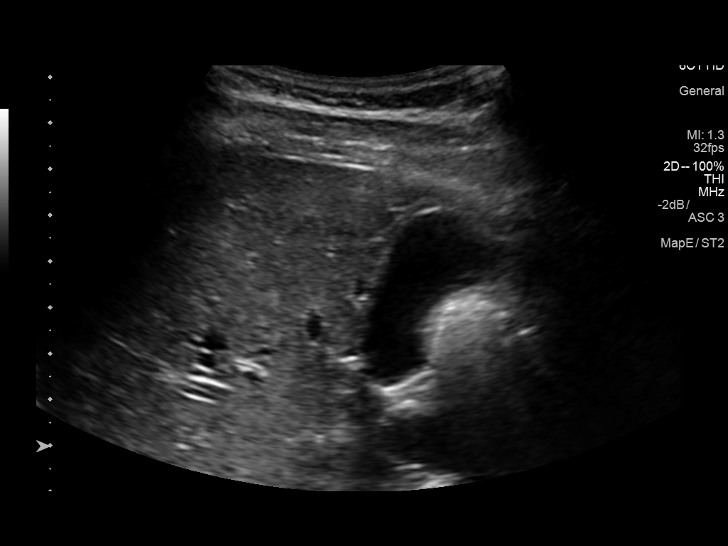
[im 17/49]
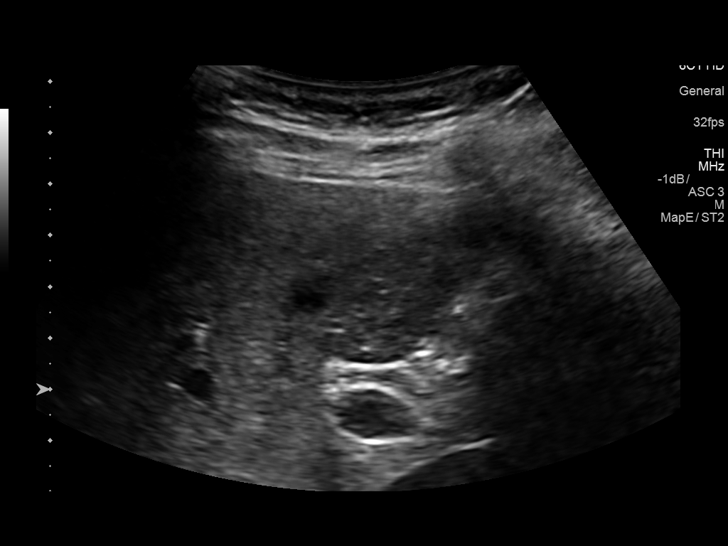
[im 19/49]
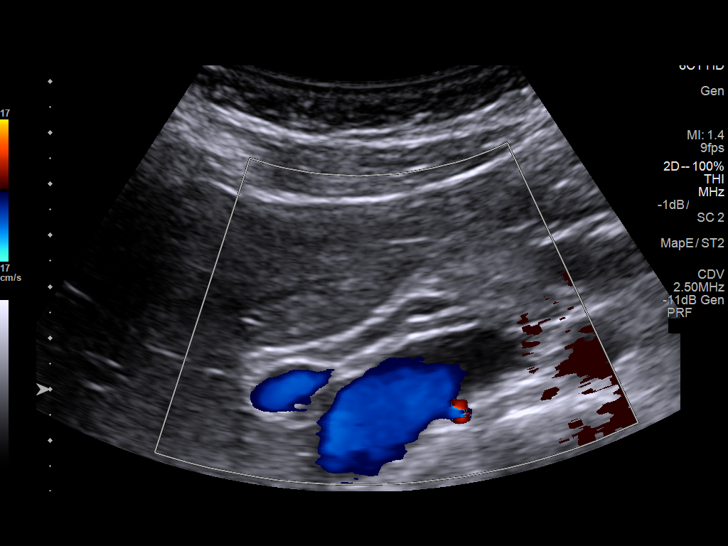
[im 23/49]
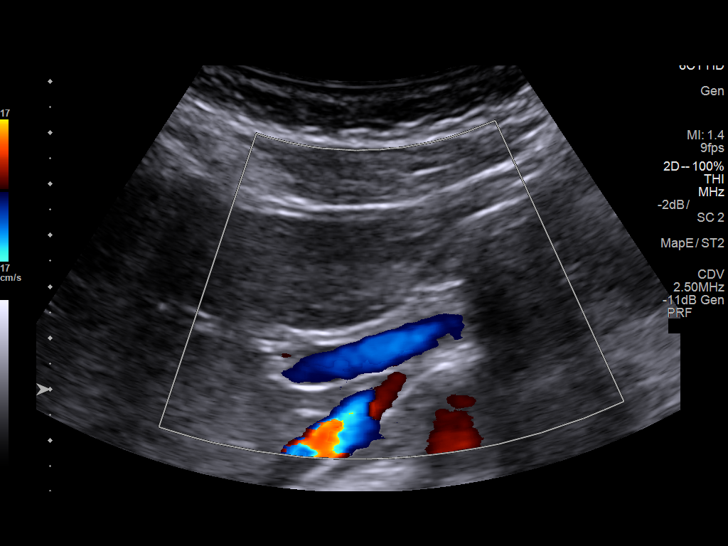
[im 27/49]
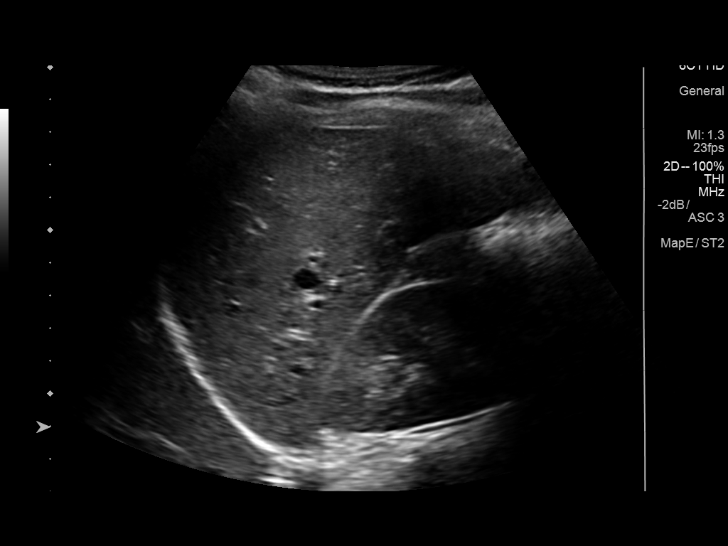
[im 31/49]
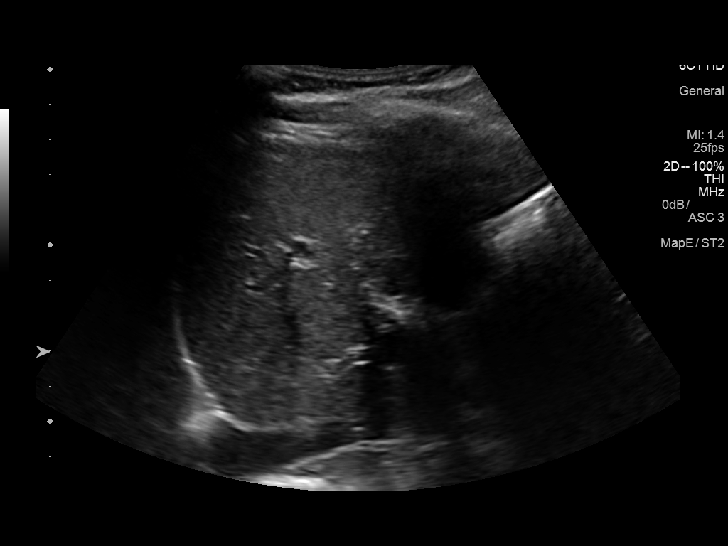
[im 33/49]
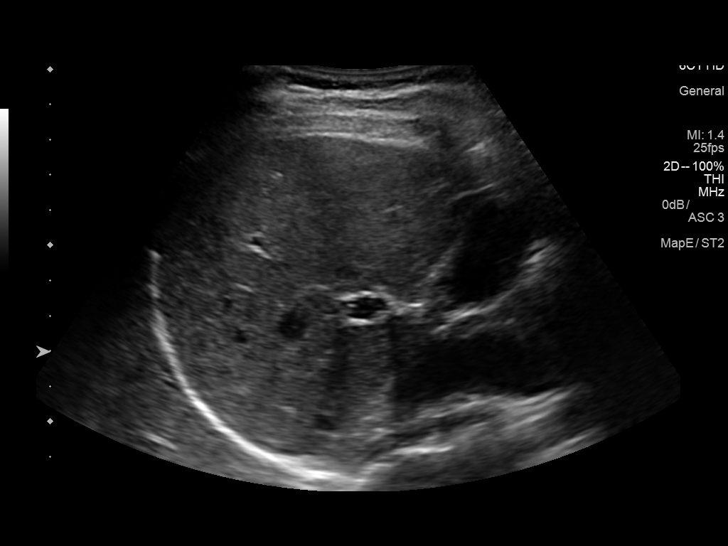
[im 37/49]
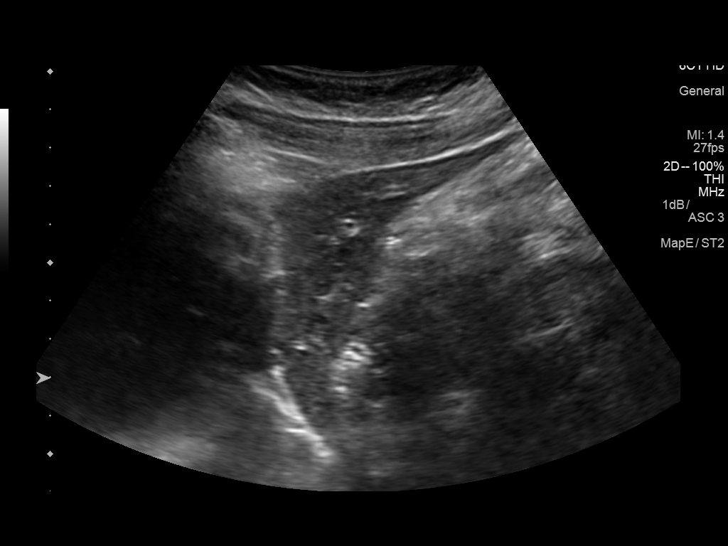
[im 41/49]
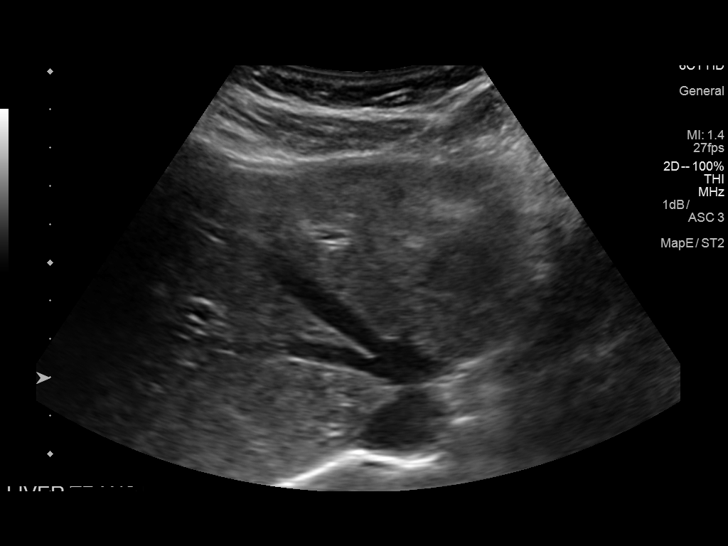
[im 45/49]
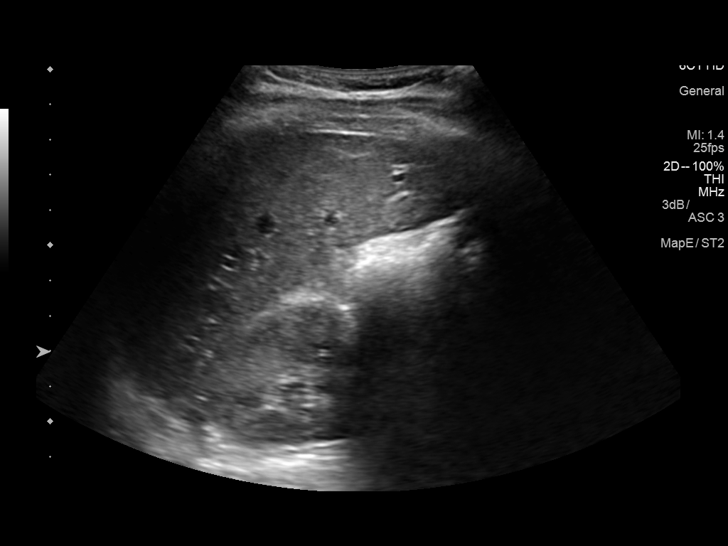
[im 49/49]
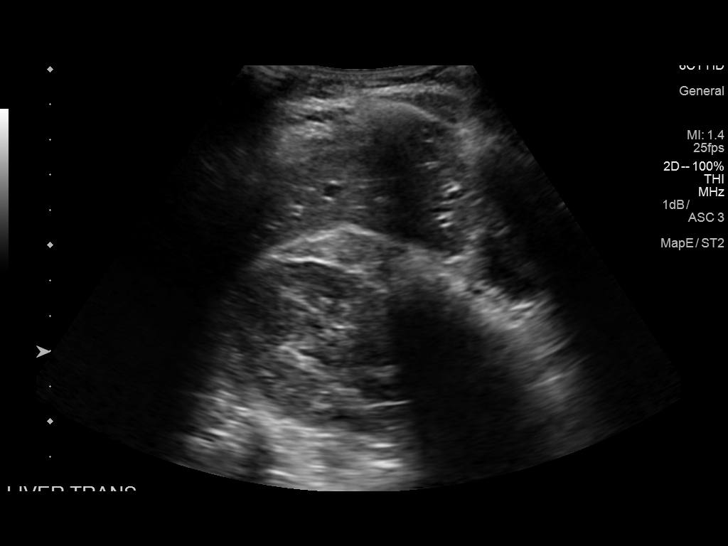

[14 of 25 positions shown; findings below may reference images not displayed]

FINDINGS: Gallbladder:

No gallstones or wall thickening visualized. There is no
pericholecystic fluid. No sonographic Murphy sign noted by
sonographer.

Common bile duct:

Diameter: 3 mm. No intrahepatic or extrahepatic biliary duct
dilatation.

Liver:

No focal lesion identified. Within normal limits in parenchymal
echogenicity. Portal vein is patent on color Doppler imaging with
normal direction of blood flow towards the liver.
IMPRESSION: Study within normal limits.

## 2019-07-16 ENCOUNTER — Ambulatory Visit (INDEPENDENT_AMBULATORY_CARE_PROVIDER_SITE_OTHER): Payer: No Typology Code available for payment source | Admitting: Allergy & Immunology

## 2019-07-16 ENCOUNTER — Other Ambulatory Visit: Payer: Self-pay

## 2019-07-16 ENCOUNTER — Telehealth: Payer: Self-pay | Admitting: Cardiology

## 2019-07-16 ENCOUNTER — Encounter: Payer: Self-pay | Admitting: Allergy & Immunology

## 2019-07-16 VITALS — BP 102/72 | HR 79 | Temp 98.3°F | Resp 16 | Ht 62.0 in

## 2019-07-16 DIAGNOSIS — J3089 Other allergic rhinitis: Secondary | ICD-10-CM | POA: Diagnosis not present

## 2019-07-16 DIAGNOSIS — K219 Gastro-esophageal reflux disease without esophagitis: Secondary | ICD-10-CM

## 2019-07-16 DIAGNOSIS — R002 Palpitations: Secondary | ICD-10-CM | POA: Diagnosis not present

## 2019-07-16 DIAGNOSIS — T7840XD Allergy, unspecified, subsequent encounter: Secondary | ICD-10-CM | POA: Diagnosis not present

## 2019-07-16 DIAGNOSIS — J302 Other seasonal allergic rhinitis: Secondary | ICD-10-CM

## 2019-07-16 NOTE — Telephone Encounter (Signed)
Does she have the iPhone adapter to record her heart rhythm, I thought that she did.  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  I would like to see that to be sure that she is not atrial fibrillation before making arrangements for a 1 week ZIO monitor.  If the answer to the first 2 is no questions bring her to the office tomorrow and force her into the schedule

## 2019-07-16 NOTE — Progress Notes (Signed)
FOLLOW UP  Date of Service/Encounter:  07/16/19   Assessment/Plan:   Allergic reaction - secondary most like to a food rather than immunotherapy  Multiple food allergies   Allergic rhinitis with OAS  GERD   Weight loss   Palpitations - worsened with cetirizine  Reactive airway, allergen driven   [name] [name] is a 33 y.o. female who is well known to our practice. She presents following an allergic reaction that occurred yesterday after eating beef and spaghetti sauce. These are two items that she has previously tolerated. I am unsure of what triggered the reaction, but I do not think that it had anything to do with her allergen immunotherapy given the time course. I do not think that any dosing changes are needed at this time. I also recommended that she change to Xyzal instead of Claritin since it might be a bit stronger and might be able to tamp down her reactions. I recommended not doing the Allegra given the lack of a "clean version". 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    Subjective:   [name] [name] is a 33 y.o. female presenting today for follow up of  Chief Complaint  Patient presents with  . Allergic Reaction    Red pasta sauce and ground beef meal    [name] [name] has a history of the following: Patient Active Problem List   Diagnosis Date Noted  . Generalized headaches 07/06/2019  . Imbalance 07/06/2019  . Asthma, well controlled 01/13/2019  . Allergic reaction 01/12/2019  . Adverse food reaction 01/12/2019  . Pollen-food allergy 01/12/2019  . Other allergic rhinitis 01/12/2019  . Seasonal allergies 05/14/2018    History obtained from: chart review and patient.  [name] [name] is a 33 y.o. female presenting for a sick visit.  She reports that last night around 4:30pm, she developed throat irritation after tasting some of the meal that she was  making. This was beef and a spaghetti sauce brand that she has tolerated in the past on multiple occasions. She did not take anything to treat this and it just resolved over the course of a few hours. She did not use her EpiPen. She did not take any extra antihistamines. She did call the office to see if she could be worked in, but instead we just scheduled her for a visit today.   She has an appointment on August 20th for palpitations with Cardiology. Until that time she is avoiding taking cetirizine since this seemed to make her palpitations worse. She was taking cetirizine but she was having palpitations. She did have Claritin on board but clearly that did not provide any protection at all. She does have Allegra but it has some ingredients that she has reacted to in the past. She is wondering if there is a "cleaner version", although the only other option is a gel capsule.   She does not think that this was related to her allergen immunotherapy since she got her allergy shot last on August 4th and did not have the reaction until the evening of August 5th. She is on a daily antihistamine, but only Claritin at this point.   [name] [name] is on allergen immunotherapy. She receives two injections. Immunotherapy script #1 contains trees, weeds, grasses, cat and dog. She currently receives 0.4640mL of the GOLD vial (1/10,000). Immunotherapy script #2 contains molds and dust  mites. She currently receives 0.2mL of the GOLD vial (1/10,000). She started shots April of 2020 and not yet reached maintenance. She has mostly tolerated these without any problems whatsoever. She has felt that they have provided some improvement in her symptoms, although she tells me that she still cannot eat a lot of foods.   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 She is wondering if she should get a note to try to get her from leaving the area and ensure that she remains safe.    Otherwise, there have been no changes to her past medical history, surgical history, family history, or social history.    Review of Systems  Constitutional: Negative for chills, fever, malaise/fatigue and weight loss.  HENT: Positive for sore throat. Negative for congestion, ear discharge and ear pain.   Eyes: Negative for pain, discharge and redness.  Respiratory: Positive for cough. Negative for sputum production, shortness of breath and wheezing.   Cardiovascular: Negative.  Negative for chest pain and palpitations.  Gastrointestinal: Positive for nausea. Negative for abdominal pain, heartburn and vomiting.  Skin: Negative.  Negative for itching and rash.  Neurological: Negative for dizziness and headaches.  Endo/Heme/Allergies: Positive for environmental allergies. Does not bruise/bleed easily.       Objective:   Blood pressure 102/72, pulse 79, temperature 98.3 F (36.8 C), temperature source Temporal, resp. rate 16, height 5\' 2"  (1.575 m), SpO2 98 %. Body mass index is 22.97 kg/m.   Physical Exam:  Physical Exam  Constitutional: She appears well-developed.  HENT:  Head: Normocephalic and atraumatic.  Right Ear: Tympanic membrane, external ear and ear canal normal.  Left Ear: Tympanic membrane, external ear and ear canal normal.  Nose: Mucosal edema present. No rhinorrhea, nasal deformity or septal deviation. No epistaxis. Right sinus exhibits no maxillary sinus tenderness and no frontal sinus tenderness. Left sinus exhibits no maxillary sinus tenderness and no frontal sinus tenderness.  Mouth/Throat: Uvula is midline and oropharynx is clear and moist. Mucous membranes are not pale and not dry.  Eyes: Pupils are equal, round, and reactive to light. Conjunctivae and EOM are normal. Right eye exhibits no chemosis and no discharge. Left eye exhibits no chemosis and no discharge. Right conjunctiva is not injected. Left conjunctiva is not injected.  Cardiovascular: Normal  rate, regular rhythm and normal heart sounds. Exam reveals no gallop and no friction rub.  No murmur heard. Respiratory: Effort normal and breath sounds normal. No accessory muscle usage. No tachypnea. No respiratory distress. She has no wheezes. She has no rhonchi. She has no rales. She exhibits no tenderness.  Lymphadenopathy:    She has no cervical adenopathy.  Neurological: She is alert.  Skin: No abrasion, no petechiae and no rash noted. Rash is not papular, not vesicular and not urticarial. No erythema. No pallor.  No eczematous or urticarial lesions noted.  Psychiatric: She has a normal mood and affect.     Diagnostic studies: none      Salvatore Marvel, MD  [facility]

## 2019-07-16 NOTE — Telephone Encounter (Signed)
Patient states her allergist prompted her to call regarding palpitation episodes becoming more frequent. 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 Patient notices increased palpitations when she takes zyrtec, hydroxyzine, and omeprazole. She has also been waking up throughout the night with palpitations. She states "it feels like my heart is out of rhythm." During these episodes, she checks her heart rate which is always under 100 beats per minute. Her BP runs within normal limits. Patient is scheduled for a follow up appointment on 07/30/2019 at 4:15 pm.  Please advise if she needs to be sooner or if you have any further recommendations. Thanks!

## 2019-07-16 NOTE — Telephone Encounter (Signed)
Patient does not have the [facility] app or a way to get an EKG done at work, so she has been scheduled for an appointment in [facility] office with Dr. Geraldo Pitter tomorrow, 07/17/2019, at 11:20 am per Dr. Bettina Gavia. Patient is agreeable and verbalized understanding. No further questions.

## 2019-07-17 ENCOUNTER — Other Ambulatory Visit (INDEPENDENT_AMBULATORY_CARE_PROVIDER_SITE_OTHER): Payer: No Typology Code available for payment source

## 2019-07-17 ENCOUNTER — Encounter: Payer: Self-pay | Admitting: Cardiology

## 2019-07-17 ENCOUNTER — Ambulatory Visit (INDEPENDENT_AMBULATORY_CARE_PROVIDER_SITE_OTHER): Payer: No Typology Code available for payment source | Admitting: Cardiology

## 2019-07-17 VITALS — BP 106/70 | HR 84 | Ht 62.0 in | Wt 118.0 lb

## 2019-07-17 DIAGNOSIS — R002 Palpitations: Secondary | ICD-10-CM

## 2019-07-17 NOTE — Patient Instructions (Addendum)
Medication Instructions:  Your physician recommends that you continue on your current medications as directed. Please refer to the Current Medication list given to you today.  If you need a refill on your cardiac medications before your next appointment, please call your pharmacy.   Lab work: NONE If you have labs (blood work) drawn today and your tests are completely normal, you will receive your results only by: Marland Kitchen MyChart Message (if you have MyChart) OR . A paper copy in the mail If you have any lab test that is abnormal or we need to change your treatment, we will call you to review the results.  Testing/Procedures: You had an EKG performed today.  Your physician has recommended that you wear a ZIO monitor.ZIOmonitors are medical devices that record the heart's electrical activity. Doctors most often use these monitors to diagnose arrhythmias. Arrhythmias are problems with the speed or rhythm of the heartbeat. The monitor is a small, portable device. You can wear one while you do your normal daily activities. This is usually used to diagnose what is causing palpitations/syncope (passing out).You will wear this device for 7 days.   Follow-Up: At [facility], you and your health needs are our priority.  As part of our continuing mission to provide you with exceptional heart care, we have created designated Provider Care Teams.  These Care Teams include your primary Cardiologist (physician) and Advanced Practice Providers (APPs -  Physician Assistants and Nurse Practitioners) who all work together to provide you with the care you need, when you need it. You will need to keep your follow up appt on 07/30/19 at 0820 with Dr. Bettina Gavia

## 2019-07-17 NOTE — Progress Notes (Signed)
Patient came in for an EKG to the office.  She is alert awake and oriented.  Her EKG reveals sinus rhythm and nonspecific ST-T changes.  Findings were discussed with the patient and the nurse and questions were answered to her satisfaction.

## 2019-07-17 NOTE — Addendum Note (Signed)
Addended by: Beckey Rutter on: 07/17/2019 09:05 AM   Modules accepted: Orders

## 2019-07-20 ENCOUNTER — Encounter: Payer: Self-pay | Admitting: Gastroenterology

## 2019-07-20 ENCOUNTER — Ambulatory Visit (INDEPENDENT_AMBULATORY_CARE_PROVIDER_SITE_OTHER): Payer: No Typology Code available for payment source | Admitting: Gastroenterology

## 2019-07-20 VITALS — BP 106/66 | HR 84 | Temp 98.2°F | Ht 61.5 in | Wt 120.0 lb

## 2019-07-20 DIAGNOSIS — R634 Abnormal weight loss: Secondary | ICD-10-CM

## 2019-07-20 DIAGNOSIS — R0989 Other specified symptoms and signs involving the circulatory and respiratory systems: Secondary | ICD-10-CM

## 2019-07-20 NOTE — Progress Notes (Signed)
    History of Present Illness: This is a 33 year old female complaining of gradual weight loss. She was evaluated for sensation in March 2020.  EGD with esophageal biopsies unremarkable in March.  She has multiple allergies / reaction and is followed by an allergist at [facility] and an allergist locally, Dr. Nelva Bush.  She relates that due to her multiple food allergies / reactions she has limited food options. She finds that she is frequently hungry but has a difficult time with her limited dietary choices.  She relates a gradual weight loss over the past year.  Her weight is 16 pounds less than at her February 09, 2019 office visit which was 12 pound less than her weight in  Nov 2019.  She clearly feels she is eating less and that she is not eating enough to maintain her weight.  She relates that she is working on disability. She related her last dietitian was not able to assist her with her concerns.   Current Medications, Allergies, Past Medical History, Past Surgical History, Family History and Social History were reviewed in [employer] record.   Physical Exam: General: Well developed, well nourished, no acute distress Head: Normocephalic and atraumatic Eyes:  sclerae anicteric, EOMI Ears: Normal auditory acuity Mouth: No deformity or lesions Lungs: Clear throughout to auscultation Heart: Regular rate and rhythm; no murmurs, rubs or bruits Abdomen: Soft, non tender and non distended. No masses, hepatosplenomegaly or hernias noted. Normal Bowel sounds Rectal: Not done Musculoskeletal: Symmetrical with no gross deformities  Pulses:  Normal pulses noted Extremities: No clubbing, cyanosis, edema or deformities noted Neurological: Alert oriented x 4, grossly nonfocal Psychological:  Alert and cooperative. Normal mood and affect   Assessment and Recommendations:  1.  Weight loss, gradual, secondary to decreased calorie intake. There is no indication of digestive disorders  contributing to weight loss.  She relates her dietary choices are substantially limited per recommendations from her allergists.  She would benefit from working with a dietitian to increase calorie intake.  She did not feel she received adequate support from her previous dietitian to achieve a balanced diet and maintain a stable weight.  She requests referral to a different dietitian and we will help to facilitate.  She requested me to complete disability forms for her and I advised her that I probably will not be able to provide significant input given weight loss due to allergy related dietary restrictions and globus sensation.  Her allergists and PCP will probably have more significant input.  2.  Globus sensation.  She does not have a clear-cut diagnosis of GERD however will continue omeprazole 40 mg daily as it may be providing some benefit if a component of her globus sensation is triggered by GERD.

## 2019-07-20 NOTE — Patient Instructions (Signed)
Ask your primary care physician or allergist physician if they have any recommendations for a new dietician.   Thank you for choosing me and [facility].  Pricilla Riffle. Dagoberto Ligas., MD., Marval Regal

## 2019-07-21 ENCOUNTER — Telehealth: Payer: Self-pay | Admitting: *Deleted

## 2019-07-21 NOTE — Telephone Encounter (Signed)
Patient called and stated she had problems with her last shot and had to use Prednisone. Please see office visit note from 07/16/2019. Patient wants to know if you want to hold off this week on her injections or is it okay to come on in. Please advise.

## 2019-07-21 NOTE — Telephone Encounter (Signed)
Yes saw OV note.  Do not feel the symptoms she had was related to allergy shot.  However would just repeat her last dose and ok for injection this week.

## 2019-07-21 NOTE — Telephone Encounter (Signed)
PT INFORMED. NOTED TO REPEAT IN ALLERGY FLOWSHEET.

## 2019-07-22 ENCOUNTER — Ambulatory Visit: Payer: No Typology Code available for payment source | Admitting: Physical Therapy

## 2019-07-22 ENCOUNTER — Other Ambulatory Visit: Payer: Self-pay

## 2019-07-22 ENCOUNTER — Encounter: Payer: Self-pay | Admitting: Physical Therapy

## 2019-07-22 DIAGNOSIS — M6281 Muscle weakness (generalized): Secondary | ICD-10-CM | POA: Diagnosis not present

## 2019-07-22 NOTE — Therapy (Signed)
[facility] [address] [place], [place], [address] Phone: [phone]   Fax:  [phone]  Physical Therapy Treatment  Patient Details  Name: [name] [name] MRN: [id number] Date of Birth: July 08, 1986 Referring Provider (PT): Leodis Sias, MD   Encounter Date: 07/22/2019  PT End of Session - 07/22/19 1604    Visit Number  2    Date for PT Re-Evaluation  09/07/19    Authorization Type  [facility]    PT Start Time  1531    PT Stop Time  1610    PT Time Calculation (min)  39 min    Activity Tolerance  Patient tolerated treatment well;Patient limited by fatigue    Behavior During Therapy  [facility] for tasks assessed/performed       Past Medical History:  Diagnosis Date  . ADHD   . Angio-edema   . Asthma   . UTI (urinary tract infection)    hx of freq in 2018    Past Surgical History:  Procedure Laterality Date  . ESOPHAGOGASTRODUODENOSCOPY    . LEEP  2010  . WISDOM TOOTH EXTRACTION  2008    There were no vitals filed for this visit.  Subjective Assessment - 07/22/19 1536    Subjective  Doing my HEP, I am able to do them.    Currently in Pain?  No/denies    Multiple Pain Sites  No                       [facility] Adult PT Treatment/Exercise - 07/22/19 0001      Lumbar Exercises: Aerobic   UBE (Upper Arm Bike)  L1 2x2 with postural emphasis    Nustep  L1 x 6 min PTA present to discuss status      Lumbar Exercises: Standing   Other Standing Lumbar Exercises  Counter top pushups 10x VC for > core       Lumbar Exercises: Seated   Long Arc Quad on Chair  Strengthening;Both;1 set;10 reps    Other Seated Lumbar Exercises  Shoulder 3 way raise 10x each       Lumbar Exercises: Supine   Bridge  10 reps    Straight Leg Raise  10 reps      Lumbar Exercises: Sidelying   Hip Abduction  Right;Left;10 reps    Hip Abduction Limitations  Added to HEP             PT Education - 07/22/19 1614     Education Details  Countertop pushups and S/L hip abduction for HEP progresison. Advised pt to ride her bike at home 6 min. She agreed to try.    Person(s) Educated  Patient    Methods  Explanation;Demonstration;Verbal cues;Handout    Comprehension  Returned demonstration;Verbalized understanding       PT Short Term Goals - 07/13/19 1708      PT SHORT TERM GOAL #1   Title  be independent in initial HEP    Time  4    Period  Weeks    Status  New    Target Date  08/10/19      PT SHORT TERM GOAL #2   Title  perform aerobic exercise > or = to 3x/wk without limitation    Time  4    Period  Weeks    Status  New    Target Date  08/10/19      PT SHORT TERM GOAL #3  Title  demonstrate 4/5 bil hip strength to improve endurance for community ambulation    Time  8    Period  Weeks    Status  New    Target Date  09/07/19        PT Long Term Goals - 07/13/19 1710      PT LONG TERM GOAL #1   Title  be independent in advanced HEP    Time  8    Period  Weeks    Status  New    Target Date  09/07/19      PT LONG TERM GOAL #2   Title  demonstrate 4+/5 bil UE and LE strength to improve endurance for home and community function    Time  8    Period  Weeks    Status  New    Target Date  09/07/19      PT LONG TERM GOAL #3   Title  perform regular cardio and strength exercise and verbalize understanding of how to progress    Time  8    Period  Weeks    Status  New    Target Date  09/07/19      PT LONG TERM GOAL #4   Title  improve endurance to walk in the community for > or = to 30 minutes for independence with grocery shopping    Time  8    Period  Weeks    Status  New    Target Date  09/07/19            Plan - 07/22/19 1539    Clinical Impression Statement  Pt compliant with her initial HEP, meeting goal. No pain, energy levels can be a barrier. Today pt as able to begin cardiopulmonary exercises with mod fatigue at the end of her session. Added to HEP today.     Personal Factors and Comorbidities  Comorbidity 1    Comorbidities  oral allergy syndrome    Examination-Activity Limitations  Locomotion Level;Squat;Stairs;Stand    Examination-Participation Restrictions  Community Activity    Stability/Clinical Decision Making  Stable/Uncomplicated    Rehab Potential  Excellent    PT Frequency  2x / week    PT Duration  8 weeks    PT Treatment/Interventions  Moist Heat;Cryotherapy;Neuromuscular re-education;Therapeutic exercise;Therapeutic activities;Functional mobility training;Patient/family education;Manual techniques;Taping;Passive range of motion    PT Next Visit Plan  Cardio; review new exercises, add light weights to LAQ and 3 way raises    PT Home Exercise Plan  Access Code: [facility]    Consulted and Agree with Plan of Care  Patient       Patient will benefit from skilled therapeutic intervention in order to improve the following deficits and impairments:  Decreased activity tolerance, Decreased endurance, Decreased strength, Impaired flexibility  Visit Diagnosis: 1. Muscle weakness (generalized)        Problem List Patient Active Problem List   Diagnosis Date Noted  . Generalized headaches 07/06/2019  . Imbalance 07/06/2019  . Asthma, well controlled 01/13/2019  . Allergic reaction 01/12/2019  . Adverse food reaction 01/12/2019  . Pollen-food allergy 01/12/2019  . Other allergic rhinitis 01/12/2019  . Seasonal allergies 05/14/2018    Otha Monical, PTA 07/22/2019, 4:16 PM  [facility] [address], [address] [place], [place], [address] Phone: [phone]   Fax:  [phone]  Name: Rashea Stellhorn MRN: [id number] Date of Birth: 10-Nov-1986  Access Code: [facility]  URL: [email or web address]  Date: 07/22/2019  Prepared  by: Ane Payment   Exercises  Supine Active Straight Leg Raise - 10 reps - 2 sets - 2x daily - 7x weekly  Supine Bridge - 10 reps - 2  sets - 5 hold - 7x weekly  Seated Long Arc Quad - 10 reps - 2 sets - 5 hold - 2x daily - 7x weekly  Seated Shoulder Abduction - Palms Down - 10 reps - 2 sets - 2x daily - 7x weekly  Seated Shoulder Flexion - 10 reps - 2 sets - 2x daily - 7x weekly  Seated Shoulder Scaption - 10 reps - 2 sets - 2x daily - 7x weekly  Sidelying Hip Abduction - 10 reps - 1 sets - 1x daily - 7x weekly  Push Up on Table - 10 reps - 1 sets - 1x daily - 7x weekly

## 2019-07-23 ENCOUNTER — Ambulatory Visit (INDEPENDENT_AMBULATORY_CARE_PROVIDER_SITE_OTHER): Payer: No Typology Code available for payment source

## 2019-07-23 ENCOUNTER — Telehealth: Payer: Self-pay

## 2019-07-23 DIAGNOSIS — T7840XD Allergy, unspecified, subsequent encounter: Secondary | ICD-10-CM

## 2019-07-23 DIAGNOSIS — J309 Allergic rhinitis, unspecified: Secondary | ICD-10-CM | POA: Diagnosis not present

## 2019-07-23 NOTE — Telephone Encounter (Signed)
-----   Message from Ladene Artist, MD sent at 07/21/2019  5:23 PM EDT ----- She indicated to me that she wanted a different dietitian in the [facility] insurance network. I don't recall her mentioning an allergy dietitian. I have no information about allergy dietitians.   She could contact her [facility] allergist or Dr. Nelva Bush, her local allergist. If she would like [place] to make the [facility] dietitian referral they will call her and she can ask them directly about her specific needs.   Please encourage her to eat more calories each day of the foods she knows she can tolerate.   ----- Message ----- From: Marzella Schlein, CMA Sent: 07/21/2019   4:31 PM EDT To: Ladene Artist, MD   Patient has already seen all the physicians at [facility] health diabetes and nutrition management. Patient states they actually are the ones who told her to come back to see you. She told me she does not want to go back them. Patient told me she needs to see a allergy dietician because [facility] dieticians told her they have nothing left to offer her. I informed patient that I do not not know of a allergy dietician but told her to ask her PCP. Patient told me she already has and they told her the same thing. Do you still want me to make the referral? ----- Message ----- From: Ladene Artist, MD Sent: 07/21/2019   4:15 PM EDT To: Marzella Schlein, CMA  Please place a nutrition referral for this patient. See 8/10 office note. Thx.

## 2019-07-23 NOTE — Telephone Encounter (Signed)
Called patient with no answer and voicemail box is full. °

## 2019-07-27 NOTE — Telephone Encounter (Signed)
Called patient with no answer and voicemail box is full. °

## 2019-07-28 ENCOUNTER — Ambulatory Visit (INDEPENDENT_AMBULATORY_CARE_PROVIDER_SITE_OTHER): Payer: No Typology Code available for payment source | Admitting: *Deleted

## 2019-07-28 DIAGNOSIS — J309 Allergic rhinitis, unspecified: Secondary | ICD-10-CM | POA: Diagnosis not present

## 2019-07-28 NOTE — Telephone Encounter (Signed)
Called patient with no answer and voicemail box is full. °

## 2019-07-29 ENCOUNTER — Ambulatory Visit: Payer: No Typology Code available for payment source | Admitting: Physical Therapy

## 2019-07-30 ENCOUNTER — Encounter: Payer: Self-pay | Admitting: Cardiology

## 2019-07-30 ENCOUNTER — Other Ambulatory Visit: Payer: Self-pay

## 2019-07-30 ENCOUNTER — Ambulatory Visit (INDEPENDENT_AMBULATORY_CARE_PROVIDER_SITE_OTHER): Payer: No Typology Code available for payment source | Admitting: Cardiology

## 2019-07-30 ENCOUNTER — Ambulatory Visit: Payer: No Typology Code available for payment source | Admitting: Allergy

## 2019-07-30 ENCOUNTER — Encounter: Payer: No Typology Code available for payment source | Admitting: Allergy

## 2019-07-30 VITALS — BP 94/60 | HR 80 | Ht 62.0 in | Wt 121.6 lb

## 2019-07-30 DIAGNOSIS — T7800XD Anaphylactic reaction due to unspecified food, subsequent encounter: Secondary | ICD-10-CM

## 2019-07-30 DIAGNOSIS — R002 Palpitations: Secondary | ICD-10-CM | POA: Diagnosis not present

## 2019-07-30 NOTE — Patient Instructions (Signed)
Medication Instructions:  Your physician recommends that you continue on your current medications as directed. Please refer to the Current Medication list given to you today.  If you need a refill on your cardiac medications before your next appointment, please call your pharmacy.   Lab work: None  If you have labs (blood work) drawn today and your tests are completely normal, you will receive your results only by: . MyChart Message (if you have MyChart) OR . A paper copy in the mail If you have any lab test that is abnormal or we need to change your treatment, we will call you to review the results.  Testing/Procedures: None  Follow-Up: At [facility], you and your health needs are our priority.  As part of our continuing mission to provide you with exceptional heart care, we have created designated Provider Care Teams.  These Care Teams include your primary Cardiologist (physician) and Advanced Practice Providers (APPs -  Physician Assistants and Nurse Practitioners) who all work together to provide you with the care you need, when you need it. You will need a follow up appointment in 6 weeks.   

## 2019-07-30 NOTE — Progress Notes (Signed)
Cardiology Office Note:    Date:  07/30/2019   ID:  [name], [name] 04-08-1986, MRN [id number]  PCP:  [name], MD  Cardiologist:  Norman Herrlich, MD    Referring MD: [name], MD    ASSESSMENT:    1. Palpitations    PLAN:    In order of problems listed above:  1. Palpitations -await results ZIO monitor further recommendations based upon burden and type of arrhythmia   Next appointment: 6 weeks   Medication Adjustments/Labs and Tests Ordered: Current medicines are reviewed at length with the patient today.  Concerns regarding medicines are outlined above.  No orders of the defined types were placed in this encounter.  No orders of the defined types were placed in this encounter.   Chief Complaint  Patient presents with  . Follow-up  . Palpitations    History of Present Illness:    [name] [name] is a 33 y.o. female with a hx of allergy, chest pain, and palpitations last seen 02/04/2019. Echo 02/19/19 EF 6065% with no valvular abnormalities. Notified our office of increased palpitations, EKG 07/17/19 was sinus rhythm. A Zio was ordered - it was just received by the company today and a result has not yet been released. Presents today with chief complaint of palpitations.   Her symptoms are typical of PVCs especially at night Szo forceful contraction pause feels irregular but not severe sustained.  The symptoms are worsened with antihistamine and improved since she takes only Vistaril intermittently.  No sustained arrhythmia chest pain shortness of breath or syncope.  I told her I call her when her PDF is available and give her advice about her arrhythmia and particularly avoid beta-blockers with her severe allergic illness.  She is struggling with weakness she has had a marked weight loss of 40 pounds and difficulty with feeding with multiple food intolerances she presently is on disability.  We done an echocardiogram with no evidence of infiltrative cardiomyopathy  Compliance with diet, lifestyle and medications: Yes Past Medical History:  Diagnosis Date  . ADHD   . Angio-edema   . Asthma   . UTI (urinary tract infection)    hx of freq in 2018    Past Surgical History:  Procedure Laterality Date  . ESOPHAGOGASTRODUODENOSCOPY    . LEEP  2010  . WISDOM TOOTH EXTRACTION  2008    Current Medications: Current Meds  Medication Sig  . albuterol (PROAIR HFA) 108 (90 Base) MCG/ACT inhaler Inhale 2 puffs into the lungs every 6 (six) hours as needed for wheezing or shortness of breath. With spacer  . cetirizine (ZYRTEC ALLERGY) 10 MG tablet Take 1 tablet (10 mg total) by mouth daily. (Patient taking differently: Take 10 mg by mouth daily as needed. )  . EPINEPHrine 0.3 mg/0.3 mL IJ SOAJ injection Inject 0.3 mLs (0.3 mg total) into the muscle as needed for anaphylaxis.  . famotidine (PEPCID) 40 MG tablet Take 1 tablet (40 mg total) by mouth 2 (two) times daily. (Patient taking differently: Take 40 mg by mouth as needed. )  . Fluocinolone Acetonide Scalp 0.01 % OIL APP TO THE SCALP ONCE D  . fluticasone (FLONASE) 50 MCG/ACT nasal spray Place 1 spray into both nostrils as needed for allergies or rhinitis.  . fluticasone (FLOVENT HFA) 110 MCG/ACT inhaler Inhale 2 puffs into the lungs 2 (two) times daily as needed.  . hydrOXYzine (ATARAX/VISTARIL) 10 MG tablet Take 20 mg by mouth as directed.   . nitrofurantoin, macrocrystal-monohydrate, (MACROBID)  100 MG capsule Take 1 tablet after intercourse  . omeprazole (PRILOSEC) 40 MG capsule Take 1 capsule (40 mg total) by mouth daily.  . ondansetron (ZOFRAN ODT) 8 MG disintegrating tablet Take 1 tablet (8 mg total) by mouth every 8 (eight) hours as needed for nausea or vomiting.  . Selenium Sulfide 2.3 % SHAM 1 APPLICATION TO SCALP APPLY D PRN FOR DRANDUFF  . Spacer/Aero-Holding Rudean Curt Use as directed with albuterol hfa inhaler.     Allergies:   Benadryl [diphenhydramine], Other, Peanut-containing drug  products, Corn-containing products, Soy allergy, Tobramycin, and Ketoconazole   Social History   Socioeconomic History  . Marital status: Married    Spouse name: Not on file  . Number of children: 2  . Years of education: doctorate DNP  . Highest education level: Not on file  Occupational History    Comment: [employer], NP  Social Needs  . Financial resource strain: Not on file  . Food insecurity    Worry: Not on file    Inability: Not on file  . Transportation needs    Medical: Not on file    Non-medical: Not on file  Tobacco Use  . Smoking status: Never Smoker  . Smokeless tobacco: Never Used  Substance and Sexual Activity  . Alcohol use: Yes    Comment: 1-2 glasses wine/weekly  . Drug use: No  . Sexual activity: Not on file  Lifestyle  . Physical activity    Days per week: Not on file    Minutes per session: Not on file  . Stress: Not on file  Relationships  . Social Musician on phone: Not on file    Gets together: Not on file    Attends religious service: Not on file    Active member of club or organization: Not on file    Attends meetings of clubs or organizations: Not on file    Relationship status: Not on file  Other Topics Concern  . Not on file  Social History Narrative   Lives with husband, children.  Works at [employer].  Education: doctorate level.    Tea/coffee 2-5 x week     Family History: The patient's family history includes Cancer in her father; Diabetes in her father, maternal grandmother, and mother; Eczema in her sister; Heart disease in her father; Hyperthyroidism in her mother. There is no history of Allergic rhinitis, Asthma, Urticaria, Colon cancer, Esophageal cancer, Stomach cancer, or Rectal cancer. ROS:   Please see the history of present illness.    All other systems reviewed and are negative.  EKGs/Labs/Other Studies Reviewed:    The following studies were reviewed today:  Echo 02/19/19  1. The left ventricle has  normal systolic function with an ejection fraction of 60-65%. The cavity size was normal. Left ventricular diastolic parameters were normal.  2. The right ventricle has normal systolic function. The cavity was normal. There is no increase in right ventricular wall thickness.  3. The aortic valve is tricuspid Aortic valve regurgitation was not assessed by color flow Doppler.  EKG:  EKG ordered 07/17/2019 personally reviewed.  The ekg ordered today demonstrates sinus rhythm normal including QT interval  Recent Labs: 01/08/2019: BUN 18; Creatinine, Ser 0.94; Hemoglobin 13.3; Platelets 268; Potassium 3.1; Sodium 136  Recent Lipid Panel No results found for: CHOL, TRIG, HDL, CHOLHDL, VLDL, LDLCALC, LDLDIRECT  Physical Exam:    VS:  BP 94/60 (BP Location: Left Arm, Patient Position: Sitting, Cuff Size: Normal)  Pulse 80   Ht 5\' 2"  (1.575 m)   Wt 121 lb 9.6 oz (55.2 kg)   LMP 07/11/2019   SpO2 98%   BMI 22.24 kg/m     Wt Readings from Last 3 Encounters:  07/30/19 121 lb 9.6 oz (55.2 kg)  07/20/19 120 lb (54.4 kg)  07/17/19 118 lb (53.5 kg)     GEN: She looks debilitated and weakened poor nutrition d in no acute distress HEENT: Normal NECK: No JVD; No carotid bruits LYMPHATICS: No lymphadenopathy CARDIAC: RRR, no murmurs, rubs, gallops RESPIRATORY:  Clear to auscultation without rales, wheezing or rhonchi  ABDOMEN: Soft, non-tender, non-distended MUSCULOSKELETAL:  No edema; No deformity  SKIN: Warm and dry NEUROLOGIC:  Alert and oriented x 3 PSYCHIATRIC:  Normal affect    Signed, Norman Herrlich, MD  07/30/2019 4:34 PM    [facility]

## 2019-07-31 NOTE — Progress Notes (Signed)
Pt appt at 310.  Pt left at 325 as she states had a cardiology appt at 4 she needed to go to do.      This encounter was created in error - please disregard.

## 2019-08-03 ENCOUNTER — Telehealth: Payer: Self-pay

## 2019-08-03 ENCOUNTER — Ambulatory Visit: Payer: No Typology Code available for payment source

## 2019-08-03 ENCOUNTER — Other Ambulatory Visit: Payer: Self-pay

## 2019-08-03 ENCOUNTER — Ambulatory Visit
Admission: RE | Admit: 2019-08-03 | Discharge: 2019-08-03 | Disposition: A | Discharge status: Home / Self Care | Payer: No Typology Code available for payment source | Source: Ambulatory Visit | Attending: Family Medicine | Admitting: Family Medicine

## 2019-08-03 ENCOUNTER — Other Ambulatory Visit: Payer: Self-pay | Admitting: Family Medicine

## 2019-08-03 DIAGNOSIS — M6281 Muscle weakness (generalized): Secondary | ICD-10-CM

## 2019-08-03 DIAGNOSIS — H81399 Other peripheral vertigo, unspecified ear: Secondary | ICD-10-CM

## 2019-08-03 DIAGNOSIS — R519 Headache, unspecified: Secondary | ICD-10-CM

## 2019-08-03 DIAGNOSIS — G44229 Chronic tension-type headache, not intractable: Secondary | ICD-10-CM

## 2019-08-03 DIAGNOSIS — R51 Headache: Secondary | ICD-10-CM | POA: Diagnosis not present

## 2019-08-03 DIAGNOSIS — R2689 Other abnormalities of gait and mobility: Secondary | ICD-10-CM

## 2019-08-03 NOTE — Telephone Encounter (Signed)
Pt calling today asking about a medication that is supposed to be used when she has her MRI, "can I have this medication?"  Pt also states that she left a list with the nurse about some questions she had.  Pt had an appointment last week and she left before being seen.  Explained to patient that because she had so many questions that she needed to actually be seen to have her concerns addressed. Pt hung up and ended call.

## 2019-08-03 NOTE — Telephone Encounter (Signed)
-----   Message from Debbora Presto, NP sent at 08/03/2019  2:11 PM EDT ----- Please let her know that her MRI is normal!

## 2019-08-03 NOTE — Telephone Encounter (Signed)
Left a voicemail on the patient's with the results of her MRI(DPR verified). Office number was provided in case she has any other questions or concerns.

## 2019-08-03 NOTE — Therapy (Addendum)
[facility] [address] [place], [place], [address] Phone: [phone]   Fax:  [phone]  Physical Therapy Treatment  Patient Details  Name: [name] [name] MRN: [id number] Date of Birth: Aug 15, 1986 Referring Provider (PT): Yaakov Guthrie, MD   Encounter Date: 08/03/2019  PT End of Session - 08/03/19 1610    Visit Number  3    Date for PT Re-Evaluation  09/07/19    Authorization Type  [facility]    PT Start Time  1532    PT Stop Time  1611    PT Time Calculation (min)  39 min    Activity Tolerance  Patient tolerated treatment well    Behavior During Therapy  [facility] for tasks assessed/performed       Past Medical History:  Diagnosis Date  . ADHD   . Angio-edema   . Asthma   . UTI (urinary tract infection)    hx of freq in 2018    Past Surgical History:  Procedure Laterality Date  . ESOPHAGOGASTRODUODENOSCOPY    . LEEP  2010  . [place] EXTRACTION  2008    There were no vitals filed for this visit.  Subjective Assessment - 08/03/19 1537    Subjective  I haven't been able to do my exercises.  I need to prioritize better.    Patient Stated Goals  improve overall strength, improve muscle tone to allow for participation in [place] [employer]    Currently in Pain?  No/denies                       [facility] Adult PT Treatment/Exercise - 08/03/19 0001      Exercises   Exercises  Knee/Hip      Lumbar Exercises: Aerobic   UBE (Upper Arm Bike)  L1 2x2 with postural emphasis    Nustep  L1 x 6 min PT present to discuss status      Lumbar Exercises: Standing   Row  Both;20 reps;Theraband    Theraband Level (Row)  Level 2 (Red)    Shoulder Extension  Strengthening;Both;20 reps    Theraband Level (Shoulder Extension)  Level 2 (Red)    Other Standing Lumbar Exercises  Counter top pushups 10x VC for > core       Lumbar Exercises: Seated   Long Arc Quad on Chair  Strengthening;Both;1 set;10 reps     Other Seated Lumbar Exercises  Shoulder 3 way raise 10x each       Lumbar Exercises: Supine   Bridge  10 reps    Straight Leg Raise  10 reps      Lumbar Exercises: Sidelying   Hip Abduction  Right;Left;10 reps    Hip Abduction Limitations  Added to HEP      Knee/Hip Exercises: Standing   Hip Abduction  Stengthening;Both;2 sets;10 reps    Hip Extension  Stengthening;Both;2 sets;10 reps      Knee/Hip Exercises: Seated   Sit to Sand  20 reps;without UE support               PT Short Term Goals - 08/03/19 1540      PT SHORT TERM GOAL #1   Title  be independent in initial HEP    Status  Achieved      PT SHORT TERM GOAL #2   Title  perform aerobic exercise > or = to 3x/wk without limitation    Baseline  pt has not been compliant with HEP  Time  4    Period  Weeks    Status  On-going        PT Long Term Goals - 07/13/19 1710      PT LONG TERM GOAL #1   Title  be independent in advanced HEP    Time  8    Period  Weeks    Status  New    Target Date  09/07/19      PT LONG TERM GOAL #2   Title  demonstrate 4+/5 bil UE and LE strength to improve endurance for home and community function    Time  8    Period  Weeks    Status  New    Target Date  09/07/19      PT LONG TERM GOAL #3   Title  perform regular cardio and strength exercise and verbalize understanding of how to progress    Time  8    Period  Weeks    Status  New    Target Date  09/07/19      PT LONG TERM GOAL #4   Title  improve endurance to walk in the community for > or = to 30 minutes for independence with grocery shopping    Time  8    Period  Weeks    Status  New    Target Date  09/07/19            Plan - 08/03/19 1556    Clinical Impression Statement  Pt has not been consistent in HEP and reports that she has not done much exercise since last session.  PT didn't add additional exercises due to non-compliance.  Pt was able to participate in exercise session in the clinic today  without significant fatigue.  Pt does report that she has been overall more active at home and able to do more with less rest.  Pt will continue to benefit from skilled PT for general strength and conditioning to improve endurance for daily tasks and allow for participation in physical test for [employer].    PT Frequency  2x / week    PT Duration  8 weeks    PT Treatment/Interventions  Moist Heat;Cryotherapy;Neuromuscular re-education;Therapeutic exercise;Therapeutic activities;Functional mobility training;Patient/family education;Manual techniques;Taping;Passive range of motion    PT Next Visit Plan  Cardio; review new exercises, add light weights to LAQ and 3 way raises    PT Home Exercise Plan  Access Code: [facility]    Recommended Other Services  initial certification    Consulted and Agree with Plan of Care  Patient       Patient will benefit from skilled therapeutic intervention in order to improve the following deficits and impairments:  Decreased activity tolerance, Decreased endurance, Decreased strength, Impaired flexibility  Visit Diagnosis: Muscle weakness (generalized)     Problem List Patient Active Problem List   Diagnosis Date Noted  . Generalized headaches 07/06/2019  . Imbalance 07/06/2019  . Asthma, well controlled 01/13/2019  . Allergic reaction 01/12/2019  . Adverse food reaction 01/12/2019  . Pollen-food allergy 01/12/2019  . Other allergic rhinitis 01/12/2019  . Seasonal allergies 05/14/2018    Sigurd Sos, PT 08/03/19 4:11 PM PHYSICAL THERAPY DISCHARGE SUMMARY  Visits from Start of Care: 3  Current functional level related to goals / functional outcomes: Pt attended 3 PT session and didn't return to PT.  See above for current status.     Remaining deficits: Weakness and endurance deficits.     Education /  Equipment: HEP Plan: Patient agrees to discharge.  Patient goals were not met. Patient is being discharged due to not returning since the  last visit.  ?????        Sigurd Sos, PT 10/07/19 10:10 AM   [facility] [address], [place], [place], [address] Phone: [phone]   Fax:  [phone]  Name: [name] [name] MRN: [id number] Date of Birth: Apr 20, 1986

## 2019-08-04 ENCOUNTER — Ambulatory Visit (INDEPENDENT_AMBULATORY_CARE_PROVIDER_SITE_OTHER): Payer: No Typology Code available for payment source | Admitting: *Deleted

## 2019-08-04 ENCOUNTER — Telehealth: Payer: Self-pay | Admitting: Allergy

## 2019-08-04 ENCOUNTER — Encounter: Payer: Self-pay | Admitting: *Deleted

## 2019-08-04 ENCOUNTER — Other Ambulatory Visit: Payer: Self-pay | Admitting: Allergy

## 2019-08-04 DIAGNOSIS — J309 Allergic rhinitis, unspecified: Secondary | ICD-10-CM | POA: Diagnosis not present

## 2019-08-04 NOTE — Telephone Encounter (Signed)
Yes I will the list today and have someone send via [place].   There are a lot of things to address and yes she likely would benefit from a visit but I will address the list as best as I can.

## 2019-08-04 NOTE — Telephone Encounter (Signed)
Note sent to patient via my chart.

## 2019-08-04 NOTE — Telephone Encounter (Signed)
Please send the following via Mychart to pt:   Hello [name] [name],   I received the list of items you wanted to address last week.    1 - Regarding MRI contrast.  I do not have any reason for you to have issues with the contrast use for MRI based on your allergy history.  Have you had MRI contract in the past?    2 - It should be fine to do a virtual visit for your [facility] appt as long as they offer.   I looked into the labs and the lab servicer that we use I could not find a test for soy components.  So if Dr. Lloyd Huger wants this done they will need to order it as I do not have access.   I can however order the tree nut components that they had ordered.    3 - I understand you needed to leave to get to your cardiology appt for the holter monitor which is important to capture palpitations and rule out any abnormal heart rhythms  4 - We can perform a drug challenge to Allegra and would need to bring in the Allegra you want to challenge to the office.    Would want you to be antihistamine free for 3 days prior to doing this.  5 - likewise if wanting an in-office challenge to sunflower butter and pumpkin can schedule days for this and bring in the food to perform challenge with.    6 - I looked for specific IgE testing for yuca (the root) and could not find it however there is a test for IgE for Tapioca which is from [place].   Thus we can check this.    I have ordered the tree nut components as well as tapioca for testing and the order requisition is with Ernst Bowler [facility] phlebotomist at our office.  Thus if you would like to have these drawn you can whenever you come for your allergy shot this week.    Dr. Nelva Bush

## 2019-08-05 ENCOUNTER — Ambulatory Visit: Payer: No Typology Code available for payment source | Admitting: Physical Therapy

## 2019-08-07 LAB — IGE NUT PROF. W/COMPONENT RFLX
F017-IgE Hazelnut (Filbert): 11.4 kU/L — AB
F018-IgE Brazil Nut: <0.1 kU/L
F020-IgE Almond: 0.78 kU/L — AB
F202-IgE Cashew Nut: <0.1 kU/L
F203-IgE Pistachio Nut: 0.13 kU/L — AB
F256-IgE Walnut: <0.1 kU/L
Macadamia Nut, IgE: 0.12 kU/L — AB
Peanut, IgE: 0.98 kU/L — AB
Pecan Nut IgE: <0.1 kU/L

## 2019-08-07 LAB — PEANUT COMPONENTS
F352-IgE Ara h 8: 4.87 kU/L — AB
F422-IgE Ara h 1: <0.1 kU/L
F423-IgE Ara h 2: <0.1 kU/L
F424-IgE Ara h 3: <0.1 kU/L
F427-IgE Ara h 9: <0.1 kU/L
F447-IgE Ara h 6: <0.1 kU/L

## 2019-08-07 LAB — PANEL 604726
Cor A 1 IgE: 16.7 kU/L — AB
Cor A 14 IgE: <0.1 kU/L
Cor A 8 IgE: <0.1 kU/L
Cor A 9 IgE: <0.1 kU/L

## 2019-08-07 LAB — ALLERGEN COMPONENT COMMENTS

## 2019-08-10 ENCOUNTER — Ambulatory Visit: Payer: No Typology Code available for payment source

## 2019-08-11 ENCOUNTER — Encounter: Payer: Self-pay | Admitting: Allergy

## 2019-08-11 ENCOUNTER — Telehealth: Payer: Self-pay | Admitting: *Deleted

## 2019-08-11 ENCOUNTER — Other Ambulatory Visit: Payer: Self-pay

## 2019-08-11 ENCOUNTER — Ambulatory Visit (INDEPENDENT_AMBULATORY_CARE_PROVIDER_SITE_OTHER): Payer: No Typology Code available for payment source | Admitting: Allergy

## 2019-08-11 VITALS — BP 96/64 | HR 84 | Temp 98.0°F | Resp 18 | Wt 121.4 lb

## 2019-08-11 DIAGNOSIS — T7840XD Allergy, unspecified, subsequent encounter: Secondary | ICD-10-CM

## 2019-08-11 DIAGNOSIS — R002 Palpitations: Secondary | ICD-10-CM

## 2019-08-11 DIAGNOSIS — J4540 Moderate persistent asthma, uncomplicated: Secondary | ICD-10-CM | POA: Diagnosis not present

## 2019-08-11 DIAGNOSIS — J3089 Other allergic rhinitis: Secondary | ICD-10-CM | POA: Diagnosis not present

## 2019-08-11 DIAGNOSIS — R634 Abnormal weight loss: Secondary | ICD-10-CM

## 2019-08-11 DIAGNOSIS — T781XXD Other adverse food reactions, not elsewhere classified, subsequent encounter: Secondary | ICD-10-CM | POA: Diagnosis not present

## 2019-08-11 MED ORDER — LORATADINE 10 MG PO TABS: 10.0000 mg | ORAL_TABLET | Freq: Every day | ORAL | 1 refills | Status: DC | PRN

## 2019-08-11 MED ORDER — OMEPRAZOLE 20 MG PO CPDR: 20.0000 mg | DELAYED_RELEASE_CAPSULE | Freq: Every day | ORAL | 1 refills | Status: DC

## 2019-08-11 MED ORDER — CETIRIZINE HCL 5 MG PO TABS: 5.0000 mg | ORAL_TABLET | Freq: Every day | ORAL | 1 refills | Status: DC | PRN

## 2019-08-11 MED FILL — OMEPRAZOLE 20 MG CAPSULE DR: 20 | 90 days supply | Qty: 90 | Fill #0

## 2019-08-11 NOTE — Progress Notes (Signed)
Follow-up Note  RE: [name] [name] MRN: [id number] DOB: 08-Jul-1986 Date of Office Visit: 08/11/2019   History of present illness: [name] [name] is a 33 y.o. female presenting today for follow-up of allergic reaction.  She was last seen in the office on 07/16/2019 by Dr. Ernst Bowler.   She continues to have these allergic reaction type symptoms typically after eating and she states she has continued to remove further foods from her diet.  She states that she has not been able to tolerate drinking coffee or tea or eating the eggs.  She also states that she used to be able to cook with tapioca flour and eat Yevette Edwards however this is also started to cause her to have issues that she has remove this from her diet as well.  She continues to not be able to gain weight due to her very restricted diet. She states that even taking her multivitamin that is a special meat for a food allergy patient's seems to be causing her to have issues as well. The days she has the most issues seem to occur when she has been receiving her allergen immunotherapy injection.  She states she just feels not well especially on most days. She states she has been having a lot of episodes where she feels very flushed and can also accompanied with nausea and diarrhea as well as hives throat swelling sensation.  This is all quite scary for her.  On most days she has needed to take additional doses of her antihistamine as well as prednisone to help relieve the symptoms. She did have a brain MRI earlier this month recommended by her neurologist due to headaches and poor balance.  The MRI brain was normal. She also underwent a Holter monitor as she has been having episodes of palpitations.  She states that the did capture PVCs which seemed to occur on the days where she took Zyrtec.  She has a cardiology follow-up scheduled in October. She has a follow-up with Dr. Lloyd Huger at [facility] allergy tomorrow.  In regards to her allergy immunotherapy she is on  the third of 4 vials.  Review of systems: Review of Systems  Constitutional: Positive for diaphoresis, malaise/fatigue and weight loss. Negative for fever.  HENT: Positive for sore throat. Negative for congestion, ear discharge, nosebleeds and sinus pain.   Eyes: Negative for pain, discharge and redness.  Respiratory: Negative for cough, shortness of breath and wheezing.   Cardiovascular: Positive for palpitations.  Gastrointestinal: Positive for abdominal pain and nausea. Negative for heartburn.  Musculoskeletal: Negative for joint pain.  Skin: Negative for itching and rash.  Neurological: Positive for weakness and headaches.    All other systems negative unless noted above in HPI  Past medical/social/surgical/family history have been reviewed and are unchanged unless specifically indicated below.  No changes  Medication List: Allergies as of 08/11/2019      Reactions   Benadryl [diphenhydramine]    Chest Pain   Other    Tree nuts    Peanut-containing Drug Products Anaphylaxis   Corn-containing Products    Soy Allergy    Tobramycin Other (See Comments)   Caused dizziness   Ketoconazole Rash      Medication List       Accurate as of August 11, 2019  5:31 PM. If you have any questions, ask your nurse or doctor.        STOP taking these medications   cetirizine 10 MG tablet Commonly known as: ZyrTEC Allergy Stopped by:   Larose Hires, MD   omeprazole 40 MG capsule Commonly known as: PRILOSEC Stopped by:  Larose Hires, MD   ondansetron 8 MG disintegrating tablet Commonly known as: Zofran ODT Stopped by:  Larose Hires, MD     TAKE these medications   albuterol 108 (90 Base) MCG/ACT inhaler Commonly known as: ProAir HFA Inhale 2 puffs into the lungs every 6 (six) hours as needed for wheezing or shortness of breath. With spacer   EPINEPHrine 0.3 mg/0.3 mL Soaj injection Commonly known as: EPI-PEN Inject 0.3 mLs (0.3 mg total)  into the muscle as needed for anaphylaxis.   famotidine 40 MG tablet Commonly known as: PEPCID Take 1 tablet (40 mg total) by mouth 2 (two) times daily. What changed:   when to take this  reasons to take this   Fluocinolone Acetonide Scalp 0.01 % Oil APP TO THE SCALP ONCE D   fluticasone 110 MCG/ACT inhaler Commonly known as: Flovent HFA Inhale 2 puffs into the lungs 2 (two) times daily as needed.   fluticasone 50 MCG/ACT nasal spray Commonly known as: FLONASE Place 1 spray into both nostrils as needed for allergies or rhinitis.   hydrOXYzine 10 MG tablet Commonly known as: ATARAX/VISTARIL Take 20 mg by mouth as directed.   loratadine 10 MG tablet Commonly known as: CLARITIN Take 10 mg by mouth daily as needed for allergies.   nitrofurantoin (macrocrystal-monohydrate) 100 MG capsule Commonly known as: MACROBID Take 1 tablet after intercourse   ondansetron 4 MG tablet Commonly known as: ZOFRAN Take 4 mg by mouth every 8 (eight) hours as needed for nausea or vomiting.   Selenium Sulfide 2.3 % Sham 1 APPLICATION TO SCALP APPLY D PRN FOR DRANDUFF   Spacer/Aero-Holding [employer] Use as directed with albuterol hfa inhaler.       Known medication allergies: Allergies  Allergen Reactions  . Benadryl [Diphenhydramine]     Chest Pain  . Other     Tree nuts   . Peanut-Containing Drug Products Anaphylaxis  . Corn-Containing Products   . Soy Allergy   . Tobramycin Other (See Comments)    Caused dizziness  . Ketoconazole Rash     Physical examination (limited): Blood pressure 96/64, pulse 84, temperature 98 F (36.7 C), temperature source Temporal, resp. rate 18, weight 121 lb 6.4 oz (55.1 kg), SpO2 98 %.  General: Alert, interactive, in no acute distress, thin. Skin: Warm and dry, without lesions or rashes. Extremities:  No clubbing, cyanosis or edema. Neuro:   Grossly intact.  Diagnositics/Labs:  Spirometry: FEV1: 2.22L 90%, FVC: 2.45L 84%, ratio  consistent with nonobstructive pattern  Assessment and plan:   Allergic reaction/Food allergy  Allergic rhinitis with OAS GERD Weight loss Palpitations Reactive airway, allergen driven   - Continue medications below:                A.  Dymista 1 spray each nostril twice a day or Flonase (if dymisata unavailable) 1-2 sprays each nostril daily for nasal congestion/drainage               B.  Albuterol inhaler 2 puffs every 4 hours as needed for cough, wheeze or shortness of breath    C.  Flovent 2 puffs twice a day                D.  Zyrtec 5-10mg  at bedtime and as needed during the day               E.  Famotidine 20 mg as needed    F.  Hydroxyzine 10 mg - 1-2 tablets up to 3 times a day as needed for allergic reaction    H. Have access to your Epipen    I. Use prednisone 10mg  -20mg  as needed for allergic reaction management     J. Omeprazole 20mg  daily  - has become symptomatic with use of specialty MVI thus avoiding at this time - Continue allergen immunotherapy (allergy shots) weekly injections.  Bring your Epipen on days of your injections.   Continue antihistamine pre-medication regimen prior to allergy shot - Continue to keep track of reactions and note any triggers.  Will plan to perform challenges to Allegra (for additional antihistamine option), sunbutter and pumpkin to determine if these can be added into diet - will hold off on Xolair until fall and will re-visit as additional method to help with pollen food allergy syndrome as well as to help decrease risk of symptoms - will await results and further recommendations from Dr. Ellie LunchLugar.  Follow-up on 08/12/2019. - holter monitored performed with PVCs related to zyrtec use.  Follow-up with cardiology in Oct 2020 - will continue to look into nutritionist options - ENT, GI and cardiology evaluations has been reassuring to-date.   I appreciate the opportunity to take part in Misa's care. Please do not hesitate to contact me  with questions.  Sincerely,   Margo AyeShaylar , MD Allergy/Immunology [facility]

## 2019-08-11 NOTE — Telephone Encounter (Addendum)
[name] - can you call and refer patient to the pediatric dietician as requested by Dr. Nelva Bush.   2. [name] - can you send out the most recent office visit notes with the below information attached.   3. [name] - will you make a set of maintenance vials. She is currently on [place] so they will not expire before she uses them.

## 2019-08-11 NOTE — Telephone Encounter (Signed)
-----   Message from [place], MD sent at 08/11/2019  5:37 PM EDT ----- Regarding: to send as mychart message Hi whoever works on this several things to address as below:    1) Per pt she reported that GI thought she would benefit from a pediatric nutrition/dietician since they typically are seeing more food allergy pts than an adult nutrition.   Nira Conn made me aware recently there is a pediatric dietician with the ped subspecialty clinic.  Can we see if they are able to see an adult on a special case basis?  2)  The disability forms recently completed were for short-term disability.  She is now working on long-term disability and needs the notes from August and today's visit sent to Attn: Laren Boom,  claim # [id number]  at [phone].      She reported that they have access to her claims/paperwork filed for short-term disability.    3) when able can someone call a pharmacist (can be [facility]) and see if some manufacturer makes a Allegra (fexofenadine) without the starch or any corn or soy based products as ingredients  4) her [facility] insurance is set to run out this fall.  She wanted to know if we could make her maintenance vials (red vial) while she still has her [employer] in preparation for when she runs out of current red vial once she reaches maintenance.  I don't know the answer to this.      5) Can someone please send the following below as mychart message (just send the below only to pt) to pt to access for tomorrow's visit with her [facility] allergist:   Items you can address at appt with Dr Lloyd Huger:  - agree with fall retry of Xolair  - any dietician associated with [facility] that can be helpful in determining appropriate caloric needs and alternative food options  - discuss nut panel and components and if soy and corn (if able) components should be performed or any other foods like chocolate  - could symptoms be related to mast cell activation syndrome or other  syndrome that can cause flushing/anaphylactic like symptoms (ie. Carcinoid or vipoma for example).  Tryptase levels have been normal however has not been checked during an acute episode.   Is there benefit to performing urine studies to rule out these entities that can cause flushing and anaphylactic like symptoms?   I'm trying to think outside the box here to see if there is some underlying issue that could explain constellation of continued symptoms (besides known pollen-food syndrome and food allergy)

## 2019-08-11 NOTE — Patient Instructions (Signed)
Allergic reaction/Food allergy  Allergic rhinitis with OAS GERD Weight loss Palpitations Reactive airway, allergen driven   - Continue medications below:                A.  Dymista 1 spray each nostril twice a day or Flonase (if dymisata unavailable) 1-2 sprays each nostril daily for nasal congestion/drainage               B.  Albuterol inhaler 2 puffs every 4 hours as needed for cough, wheeze or shortness of breath    C.  Flovent 116mcg 2 puffs twice a day                D.  Zyrtec 5-10mg  at bedtime and as needed during the day               E.  Famotidine 20 mg as needed    F.  Hydroxyzine 10 mg - 1-2 tablets up to 3 times a day as needed for allergic reaction    H. Have access to your Epipen    I. Use prednisone 10mg  -20mg  as needed for allergic reaction management     J. Omeprazole 20mg  daily  - has become symptomatic with use of specialty MVI thus avoiding at this time - Continue allergen immunotherapy (allergy shots) weekly injections.  Bring your Epipen on days of your injections.   Continue antihistamine pre-medication regimen prior to allergy shot - Continue to keep track of reactions and note any triggers.  Will plan to perform challenges to Allegra (for additional antihistamine option), sunbutter and pumpkin to determine if these can be added into diet - will hold off on Xolair until fall and will re-visit as additional method to help with pollen food allergy syndrome as well as to help decrease risk of symptoms - will await results and further recommendations from Dr. Lloyd Huger.  Follow-up on 08/12/2019. - holter monitored performed with PVCs related to zyrtec use.  Follow-up with cardiology in Oct 2020 - will continue to look into nutritionist options - ENT, GI and cardiology evaluations has been reassuring to-date.

## 2019-08-11 NOTE — Telephone Encounter (Signed)
Mychart message sent to patient.

## 2019-08-11 NOTE — Addendum Note (Signed)
Addended by: Theresia Lo on: 08/11/2019 05:40 PM   Modules accepted: Level of Service

## 2019-08-12 DIAGNOSIS — J301 Allergic rhinitis due to pollen: Secondary | ICD-10-CM | POA: Diagnosis not present

## 2019-08-12 NOTE — Telephone Encounter (Signed)
[name] [name] see below. I will work on the referral. Hopefully they will take her as she is not of pediatric age.

## 2019-08-12 NOTE — Telephone Encounter (Signed)
Per Dr. Nelva Bush, [employer], [employer], [employer] and [employer] all do not have corn starch in the Fexofenadine.

## 2019-08-12 NOTE — Progress Notes (Signed)
VIALS EXP 08-11-20 

## 2019-08-12 NOTE — Telephone Encounter (Signed)
Yes she has seen both the [facility] dieticians and did not like her experience.  The most recent visit she had with them the dietician (per pt report) was not equipped to handle such a complicated food allergy case as Tashanti's.    I also gave [name] [name] a local dietician she could check out but did not accept her insurance and was going to be very expensive out of pocket.

## 2019-08-12 NOTE — Telephone Encounter (Signed)
I called [facility] and they said they do not accept anyone over 35 years old. Their office would not get paid if they seen an adult.. Has the patient been referred to [facility]?

## 2019-08-12 NOTE — Telephone Encounter (Signed)
I have printed her  Notes from the month of August including yesterday's visit.  I have faxed them to:  Laren Boom Claim # [id number]  Fax: [phone].

## 2019-08-13 ENCOUNTER — Ambulatory Visit (INDEPENDENT_AMBULATORY_CARE_PROVIDER_SITE_OTHER): Payer: No Typology Code available for payment source | Admitting: *Deleted

## 2019-08-13 ENCOUNTER — Encounter: Payer: Self-pay | Admitting: *Deleted

## 2019-08-13 DIAGNOSIS — T7840XD Allergy, unspecified, subsequent encounter: Secondary | ICD-10-CM

## 2019-08-13 DIAGNOSIS — J309 Allergic rhinitis, unspecified: Secondary | ICD-10-CM

## 2019-08-13 NOTE — Telephone Encounter (Signed)
Patient informed of Fexofenadine options.

## 2019-08-19 NOTE — Telephone Encounter (Signed)
-----   Message from [place], MD sent at 08/19/2019 11:16 AM EDT ----- Regarding: lab redraw Please let pt know that the lab for tapioca IgE was never sent by the [facility] send-out department.  I have discussed with Rochelle who called to see where the result was.  It was ordered and sent appropriately to [facility] however their department never sent the lab out for testing   Unfortunately, if she would like this lab done will need to have a lab redraw (which per [facility] the lab draw fee will be waived due to [place] mistake).

## 2019-08-19 NOTE — Addendum Note (Signed)
Addended by: Chip Boer R on: 08/19/2019 11:54 AM   Modules accepted: Orders

## 2019-08-20 ENCOUNTER — Ambulatory Visit (INDEPENDENT_AMBULATORY_CARE_PROVIDER_SITE_OTHER): Payer: No Typology Code available for payment source | Admitting: *Deleted

## 2019-08-20 DIAGNOSIS — J309 Allergic rhinitis, unspecified: Secondary | ICD-10-CM

## 2019-08-26 ENCOUNTER — Other Ambulatory Visit: Payer: Self-pay

## 2019-08-26 ENCOUNTER — Encounter: Payer: Self-pay | Admitting: Allergy

## 2019-08-26 ENCOUNTER — Ambulatory Visit (INDEPENDENT_AMBULATORY_CARE_PROVIDER_SITE_OTHER): Payer: No Typology Code available for payment source | Admitting: Allergy

## 2019-08-26 VITALS — BP 106/70 | HR 69 | Temp 97.9°F | Resp 17

## 2019-08-26 DIAGNOSIS — T7840XD Allergy, unspecified, subsequent encounter: Secondary | ICD-10-CM | POA: Diagnosis not present

## 2019-08-26 DIAGNOSIS — J4540 Moderate persistent asthma, uncomplicated: Secondary | ICD-10-CM

## 2019-08-26 DIAGNOSIS — J3089 Other allergic rhinitis: Secondary | ICD-10-CM

## 2019-08-26 DIAGNOSIS — R002 Palpitations: Secondary | ICD-10-CM

## 2019-08-26 DIAGNOSIS — T781XXD Other adverse food reactions, not elsewhere classified, subsequent encounter: Secondary | ICD-10-CM

## 2019-08-26 DIAGNOSIS — R634 Abnormal weight loss: Secondary | ICD-10-CM

## 2019-08-26 NOTE — Progress Notes (Signed)
Follow-up Note  RE: [name] [name] MRN: [id number] DOB: 08/17/1986 Date of Office Visit: 08/26/2019   History of present illness: Cathi Hazan is a 33 y.o. female presenting today for drug challenge to [place].  She was last seen in the office on 08/11/2019 for follow-up visit.  She has been concerned about taking allegra as she has had palpitations related to other antihistamine use (claritin and zyrtec) and some formulations of allegra contains corn starch which is a concerning food item for her. She states she has been doing better over the last 2 weeks as she has been able to put more foods back into the diet at this time including guacamole, buck wheat, orange, lemon, black tea.   She states she has been looking all over for ice creams that don't contain nuts or have "shared equipment or facility" labeling that also doesn't contain corn syrup.   She did have a follow-up visit with Dr. Lloyd Huger at [facility] earlier this month (awaiting recommendations).     Of note she states she was able to go outside masked with her son for an outdoor event and did well with this without any increase in allergy symptoms.  She can tell a difference now as she continues to progress with immunotherapy.    She states she still has not had any weight gain.  But she feels like she has a bit more energy.    Review of systems: Review of Systems  Constitutional: Negative for chills, fever and malaise/fatigue.  HENT: Negative for congestion, ear discharge, nosebleeds and sore throat.   Eyes: Negative for pain, discharge and redness.  Respiratory: Negative for cough, shortness of breath and wheezing.   Cardiovascular: Negative for chest pain.  Gastrointestinal: Negative for abdominal pain, constipation, diarrhea, heartburn, nausea and vomiting.  Musculoskeletal: Negative for joint pain.  Skin: Negative for itching and rash.  Neurological: Negative for headaches.    All other systems negative unless noted above in HPI   Past medical/social/surgical/family history have been reviewed and are unchanged unless specifically indicated below.  No changes  Medication List: Allergies as of 08/26/2019      Reactions   Benadryl [diphenhydramine]    Chest Pain   Other    Tree nuts    Peanut-containing Drug Products Anaphylaxis   Corn-containing Products    Soy Allergy    Tobramycin Other (See Comments)   Caused dizziness   Ketoconazole Rash      Medication List       Accurate as of August 26, 2019 11:32 AM. If you have any questions, ask your nurse or doctor.        albuterol 108 (90 Base) MCG/ACT inhaler Commonly known as: ProAir HFA Inhale 2 puffs into the lungs every 6 (six) hours as needed for wheezing or shortness of breath. With spacer   cetirizine 5 MG tablet Commonly known as: ZYRTEC Take 1 tablet (5 mg total) by mouth daily as needed.   EPINEPHrine 0.3 mg/0.3 mL Soaj injection Commonly known as: EPI-PEN Inject 0.3 mLs (0.3 mg total) into the muscle as needed for anaphylaxis.   famotidine 40 MG tablet Commonly known as: PEPCID Take 1 tablet (40 mg total) by mouth 2 (two) times daily. What changed:   when to take this  reasons to take this   Fluocinolone Acetonide Scalp 0.01 % Oil APP TO THE SCALP ONCE D   fluticasone 110 MCG/ACT inhaler Commonly known as: Flovent HFA Inhale 2 puffs into the lungs 2 (two)  times daily as needed.   fluticasone 50 MCG/ACT nasal spray Commonly known as: FLONASE Place 1 spray into both nostrils as needed for allergies or rhinitis.   hydrOXYzine 10 MG tablet Commonly known as: ATARAX/VISTARIL Take 20 mg by mouth as directed.   loratadine 10 MG tablet Commonly known as: CLARITIN Take 1 tablet (10 mg total) by mouth daily as needed.   nitrofurantoin (macrocrystal-monohydrate) 100 MG capsule Commonly known as: MACROBID Take 1 tablet after intercourse   omeprazole 20 MG capsule Commonly known as: PriLOSEC Take 1 capsule (20 mg total) by  mouth daily.   ondansetron 4 MG tablet Commonly known as: ZOFRAN Take 4 mg by mouth every 8 (eight) hours as needed for nausea or vomiting.   Selenium Sulfide 2.3 % Sham 1 APPLICATION TO SCALP APPLY D PRN FOR DRANDUFF   Spacer/Aero-Holding [employer] Use as directed with albuterol hfa inhaler.       Known medication allergies: Allergies  Allergen Reactions  . Benadryl [Diphenhydramine]     Chest Pain  . Other     Tree nuts   . Peanut-Containing Drug Products Anaphylaxis  . Corn-Containing Products   . Soy Allergy   . Tobramycin Other (See Comments)    Caused dizziness  . Ketoconazole Rash   Physical examination: Blood pressure 106/70, pulse 69, temperature 97.9 F (36.6 C), temperature source Temporal, resp. rate 17, SpO2 98 %.  General: Alert, interactive, in no acute distress. HEENT: PERRLA, TMs pearly gray, turbinates mildly edematous without discharge, post-pharynx non erythematous. Neck: Supple without lymphadenopathy. Lungs: Clear to auscultation without wheezing, rhonchi or rales. {no increased work of breathing. CV: Normal S1, S2 without murmurs. Abdomen: Nondistended, nontender. Skin: Warm and dry, without lesions or rashes. Extremities:  No clubbing, cyanosis or edema. Neuro:   Grossly intact.  Diagnositics/Labs: Drug challenge to Allegra with use of Allegra. Benefits and risks of challenge discussed and verbal consent from mother obtained.  She was provided with 2 doses (90mg  + 90mg ) 20 minutes and consumed total of 180mg  of Allegra.  She was observed for additional hour after completion of ingestion challenge.  She had no signs/symptoms of allergic reaction.  Vitals were obtained prior to discharge and remained stable.    Assessment and plan:   Allergic reaction/Food allergy  Allergic rhinitis with OAS GERD Weight loss Palpitations Reactive airway, allergen driven   - Continue medications below:                A.  Dymista 1 spray each nostril  twice a day or Flonase (if dymisata unavailable) 1-2 sprays each nostril daily for nasal congestion/drainage               B.  Albuterol inhaler 2 puffs every 4 hours as needed for cough, wheeze or shortness of breath    C.  Flovent 110mcg 2 puffs twice a day                D. Allegra 180mg  daily as needed               E.  Famotidine 20 mg as needed    F.  Hydroxyzine 10 mg - 1-2 tablets up to 3 times a day as needed for allergic reaction    H.  Have access to your Epipen    I.   Use prednisone 10mg  -20mg  as needed for allergic reaction management     J.  Omeprazole 20mg  daily  - successfully passed Allegra challenge thus ok  to use this antihistamine going forward - has become symptomatic with use of specialty MVI thus avoiding at this time - Continue allergen immunotherapy (allergy shots) weekly injections.  Bring your Epipen on days of your injections.   Continue antihistamine pre-medication regimen prior to allergy shot - Continue to keep track of reactions and note any triggers.   Will plan to perform next challenge to sunbutter and pumpkin to determine if these can be added into diet. - will hold off on Xolair until fall/winter and will re-visit as additional method to help with pollen food allergy syndrome as well as to help decrease risk of symptoms - will await results and further recommendations from Dr. Ellie Lunch.  Follow-up on 08/12/2019. - holter monitored performed with PVCs related to zyrtec use.  Follow-up with cardiology in Oct 2020 - will place new referral for nutritionist, Oran Rein RD - ENT, GI and cardiology evaluations has been reassuring to-date.   I appreciate the opportunity to take part in Milanni's care. Please do not hesitate to contact me with questions.  Sincerely,   Margo Aye, MD Allergy/Immunology [facility]

## 2019-08-26 NOTE — Patient Instructions (Addendum)
Allergic reaction/Food allergy  Allergic rhinitis with OAS GERD Weight loss Palpitations Reactive airway, allergen driven   - Continue medications below:                A.  Dymista 1 spray each nostril twice a day or Flonase (if dymisata unavailable) 1-2 sprays each nostril daily for nasal congestion/drainage               B.  Albuterol inhaler 2 puffs every 4 hours as needed for cough, wheeze or shortness of breath    C.  Flovent 171mcg 2 puffs twice a day                D. Allegra 180mg  daily as needed               E.  Famotidine 20 mg as needed    F.  Hydroxyzine 10 mg - 1-2 tablets up to 3 times a day as needed for allergic reaction    H.  Have access to your Epipen    I.   Use prednisone 10mg  -20mg  as needed for allergic reaction management     J.  Omeprazole 20mg  daily  - successfully passed Allegra challenge thus ok to use this antihistamine going forward - has become symptomatic with use of specialty MVI thus avoiding at this time - Continue allergen immunotherapy (allergy shots) weekly injections.  Bring your Epipen on days of your injections.   Continue antihistamine pre-medication regimen prior to allergy shot - Continue to keep track of reactions and note any triggers.  Will plan to perform next challenge to sunbutter and pumpkin to determine if these can be added into diet - will hold off on Xolair until fall/winter and will re-visit as additional method to help with pollen food allergy syndrome as well as to help decrease risk of symptoms - will await results and further recommendations from Dr. Lloyd Huger.  Follow-up on 08/12/2019. - holter monitored performed with PVCs related to zyrtec use.  Follow-up with cardiology in Oct 2020 - will place new referral for nutritionist, Antonieta Iba RD - ENT, GI and cardiology evaluations has been reassuring to-date.

## 2019-08-27 ENCOUNTER — Encounter: Payer: Self-pay | Admitting: *Deleted

## 2019-08-28 ENCOUNTER — Telehealth: Payer: Self-pay

## 2019-08-28 NOTE — Telephone Encounter (Signed)
-----   Message from [place], MD sent at 08/26/2019 10:05 AM EDT ----- [name] [name].    [name] [name] found another [facility] that is in the [facility] located in [address] building.   Her name is [name] [name].   She said she would be willing to assist with nutrition care with [name] [name] if possible.   Can we send her a referral?

## 2019-08-28 NOTE — Telephone Encounter (Signed)
Referral has been placed. 

## 2019-08-29 LAB — MILK COMPONENT PANEL
F076-IgE Alpha Lactalbumin: <0.1 kU/L
F077-IgE Beta Lactoglobulin: <0.1 kU/L
F078-IgE Casein: <0.1 kU/L

## 2019-08-29 LAB — ALLERGEN BANANA: Allergen Banana IgE: 0.13 kU/L — AB

## 2019-08-29 LAB — EGG COMPONENT PANEL
F232-IgE Ovalbumin: <0.1 kU/L
F233-IgE Ovomucoid: <0.1 kU/L

## 2019-08-29 LAB — ALLERGEN COCONUT IGE: Allergen Coconut IgE: <0.1 kU/L

## 2019-08-29 LAB — ALLERGEN MILK: Milk IgE: <0.1 kU/L

## 2019-08-29 LAB — ALLERGEN CHOCOLATE: Chocolate/Cacao IgE: <0.1 kU/L

## 2019-08-29 LAB — IGE EGG WHITE W/COMPONENT RFLX: F001-IgE Egg White: <0.1 kU/L

## 2019-09-01 ENCOUNTER — Ambulatory Visit (INDEPENDENT_AMBULATORY_CARE_PROVIDER_SITE_OTHER): Payer: No Typology Code available for payment source | Admitting: *Deleted

## 2019-09-01 DIAGNOSIS — J309 Allergic rhinitis, unspecified: Secondary | ICD-10-CM

## 2019-09-02 ENCOUNTER — Ambulatory Visit (INDEPENDENT_AMBULATORY_CARE_PROVIDER_SITE_OTHER): Payer: No Typology Code available for payment source | Admitting: Family Medicine

## 2019-09-02 ENCOUNTER — Other Ambulatory Visit: Payer: Self-pay

## 2019-09-02 ENCOUNTER — Encounter: Payer: Self-pay | Admitting: Family Medicine

## 2019-09-02 VITALS — BP 110/70 | HR 81 | Temp 97.6°F | Resp 18

## 2019-09-02 DIAGNOSIS — T7840XD Allergy, unspecified, subsequent encounter: Secondary | ICD-10-CM

## 2019-09-02 NOTE — Patient Instructions (Addendum)
Food office challenge to sunflower butter [name] [name] was able to tolerate the sunflower butter food challenge today at the office without adverse signs or symptoms of an allergic reaction. Therefore, she has the same risk of systemic reaction associated with the consumption of sunflower butter as the general population.  - Do not eat any sunflower butter for the next 24 hours. - Monitor for allergic symptoms such as rash, wheezing, diarrhea, swelling, and vomiting for the next 24 hours. If severe symptoms occur, treat with EpiPen injection and call 911.  - If no allergic symptoms are evident, reintroduce sunbutter into the diet, 1-2 servings a day. If you develop an allergic reaction to sunflower butter, record what was eaten the amount eaten, preparation method, time from ingestion to reaction, and symptoms.   Call the clinic if this treatment plan is not working well for you  Follow up in 1 week

## 2019-09-02 NOTE — Progress Notes (Addendum)
[address] [name] [name] [place] [address] Dept: [phone]  FOLLOW UP NOTE  Patient ID: [name] [name], female    DOB: 07-25-1986  Age: 32 y.o. MRN: [id number] Date of Office Visit: 09/02/2019  Assessment  Chief Complaint: Food/Drug Challenge [facility])  HPI [name] [name] is a 33 year old female who presents to the clinic for an office food challenge to sunflower butter. At today's visit, she reports that she is feeling well overall, however, she is experiencing a slight stomach upset. She denies abdominal pain, nausea, vomiting, and diarrhea. She denies rash or itching. She reports that she has not tried sunflower butter prior to today.   Drug Allergies:  Allergies  Allergen Reactions  . Benadryl [Diphenhydramine]     Chest Pain  . Other     Tree nuts   . Peanut-Containing Drug Products Anaphylaxis  . Corn-Containing Products   . Soy Allergy   . Tobramycin Other (See Comments)    Caused dizziness  . Ketoconazole Rash    Physical Exam: BP 110/70 (BP Location: Left Arm, Patient Position: Sitting, Cuff Size: Normal)   Pulse 81   Temp 97.6 F (36.4 C) (Temporal)   Resp 18   SpO2 96%    Physical Exam Vitals signs reviewed.  Constitutional:      Appearance: Normal appearance.  HENT:     Head: Normocephalic and atraumatic.     Right Ear: Tympanic membrane normal.     Left Ear: Tympanic membrane normal.     Nose:     Comments: Bilateral nares slightly erythematous with no nasal drainage noted. Pharynx normal. Ears normal. Eyes normal.    Mouth/Throat:     Pharynx: Oropharynx is clear.  Eyes:     Conjunctiva/sclera: Conjunctivae normal.  Neck:     Musculoskeletal: Normal range of motion and neck supple.  Cardiovascular:     Rate and Rhythm: Normal rate and regular rhythm.     Heart sounds: Normal heart sounds. No murmur.  Pulmonary:     Effort: Pulmonary effort is normal.     Breath sounds: Normal breath sounds.     Comments: Lungs clear to auscultation  Musculoskeletal: Normal range of motion.  Skin:    General: Skin is warm and dry.  Neurological:     Mental Status: She is alert and oriented to person, place, and time.  Psychiatric:        Mood and Affect: Mood normal.        Behavior: Behavior normal.        Thought Content: Thought content normal.        Judgment: Judgment normal.    Procedure note:  Open graded sunflower butter oral challenge: The patient was able to tolerate the challenge today without adverse signs or symptoms. Vital signs were stable throughout the challenge and observation period. She received multiple doses separated by 15 minutes, each of which was separated by vitals and a brief physical exam. She received the following doses: lip rub, 1 gm, 2 gm, 4 gm, 8 gm, and 16 gm. She was monitored for 45 minutes following the last dose.   The patient was able to tolerate the open graded oral challenge today without adverse signs or symptoms. Therefore, she has the same risk of systemic reaction associated with the consumption of sunflower butter as the general population.  Assessment and Plan: 1. Allergic reaction, subsequent encounter     Patient Instructions  Food office challenge to sunflower butter [name] [name] was able to tolerate  the sunflower butter food challenge today at the office without adverse signs or symptoms of an allergic reaction. Therefore, she has the same risk of systemic reaction associated with the consumption of sunflower butter as the general population.  - Do not eat any sunflower butter for the next 24 hours. - Monitor for allergic symptoms such as rash, wheezing, diarrhea, swelling, and vomiting for the next 24 hours. If severe symptoms occur, treat with EpiPen injection and call 911.  - If no allergic symptoms are evident, reintroduce sunbutter into the diet, 1-2 servings a day. If you develop an allergic reaction to sunflower butter, record what was eaten the amount eaten, preparation  method, time from ingestion to reaction, and symptoms.   Call the clinic if this treatment plan is not working well for you  Follow up in 1 week   Return in about 1 week (around 09/09/2019), or if symptoms worsen or fail to improve.    Thank you for the opportunity to care for this patient.  Please do not hesitate to contact me with questions.  Gareth Morgan, FNP [facility]

## 2019-09-09 ENCOUNTER — Encounter: Payer: Self-pay | Admitting: Allergy

## 2019-09-09 ENCOUNTER — Ambulatory Visit (INDEPENDENT_AMBULATORY_CARE_PROVIDER_SITE_OTHER): Payer: No Typology Code available for payment source | Admitting: Allergy

## 2019-09-09 ENCOUNTER — Other Ambulatory Visit: Payer: Self-pay

## 2019-09-09 VITALS — BP 116/70 | HR 75 | Temp 97.7°F | Resp 18

## 2019-09-09 DIAGNOSIS — T7840XD Allergy, unspecified, subsequent encounter: Secondary | ICD-10-CM | POA: Diagnosis not present

## 2019-09-09 DIAGNOSIS — J3089 Other allergic rhinitis: Secondary | ICD-10-CM | POA: Diagnosis not present

## 2019-09-09 DIAGNOSIS — R002 Palpitations: Secondary | ICD-10-CM

## 2019-09-09 DIAGNOSIS — R634 Abnormal weight loss: Secondary | ICD-10-CM

## 2019-09-09 DIAGNOSIS — J4540 Moderate persistent asthma, uncomplicated: Secondary | ICD-10-CM

## 2019-09-09 DIAGNOSIS — T781XXD Other adverse food reactions, not elsewhere classified, subsequent encounter: Secondary | ICD-10-CM | POA: Diagnosis not present

## 2019-09-09 LAB — OTHER LAB TEST

## 2019-09-09 NOTE — Patient Instructions (Addendum)
Allergic reaction/Food allergy  Allergic rhinitis with OAS GERD Weight loss Palpitations Reactive airway, allergen driven   - Continue medications below:                A.  Dymista 1 spray each nostril twice a day or Flonase (if dymisata unavailable) 1-2 sprays each nostril daily for nasal congestion/drainage               B.  Albuterol inhaler 2 puffs every 4 hours as needed for cough, wheeze or shortness of breath    C.  Flovent 183mcg 2 puffs twice a day                D.  Allegra 180mg  daily as needed               E.  Famotidine 20 mg as needed    F.  Hydroxyzine 10 mg - 1-2 tablets up to 3 times a day as needed for allergic reaction    H.  Have access to your Epipen    I.   Use prednisone 10mg  -20mg  as needed for allergic reaction management     J.  Omeprazole 20mg  daily  - has become symptomatic with use of specialty MVI thus avoiding at this time - Continue allergen immunotherapy (allergy shots) weekly injections.  Bring your Epipen on days of your injections.   Continue antihistamine pre-medication regimen prior to allergy shot - Continue to keep track of reactions and note any triggers.  Will plan to perform next challenge to cooked rice  - will hold off on Xolair until fall/winter and will re-visit as additional method to help with pollen food allergy syndrome as well as to help decrease risk of symptoms - will await results and further recommendations from Dr. Lloyd Huger.   - Follow-up with cardiology in Oct 2020 - new referral for nutritionist in place, Antonieta Iba RD - ENT, GI and cardiology evaluations has been reassuring to-date.

## 2019-09-09 NOTE — Progress Notes (Signed)
Follow-up Note  RE: [name] [name] MRN: [id number] DOB: [birth year] Date of Office Visit: 09/09/2019   History of present illness: [name] [name] is a 33 y.o. female presenting today for follow-up of food allergy.  She was last seen in the office on 09/02/19 and performed a sunbutter challenge.  She did well during the challenge but states afterwards she was very fatigued for the next 3 days.  She also states she found some ice cream that she could eat and ate the whole container for the course of the day and states that also made her feel fatigued.  She states the ice cream may contain corn syrup which she has been trying to avoid.  Otherwise she states she has been doing well and is advancing in her allergy injections well.  She is very excited that she is up a pound!  Review of systems: Review of Systems  Constitutional: Positive for malaise/fatigue. Negative for chills and fever.  HENT: Negative for congestion, ear discharge, nosebleeds and sore throat.   Eyes: Negative for pain, discharge and redness.  Respiratory: Negative for cough, shortness of breath and wheezing.   Cardiovascular: Negative for chest pain.  Gastrointestinal: Negative for abdominal pain, constipation, diarrhea, heartburn, nausea and vomiting.  Musculoskeletal: Negative for joint pain.  Skin: Negative for itching and rash.  Neurological: Negative for headaches.    All other systems negative unless noted above in HPI  Past medical/social/surgical/family history have been reviewed and are unchanged unless specifically indicated below.  No changes  Medication List: Allergies as of 09/09/2019      Reactions   Benadryl [diphenhydramine]    Chest Pain   Other    Tree nuts    Peanut-containing Drug Products Anaphylaxis   Corn-containing Products    Soy Allergy    Tobramycin Other (See Comments)   Caused dizziness   Ketoconazole Rash      Medication List       Accurate as of September 09, 2019  4:44 PM. If  you have any questions, ask your nurse or doctor.        albuterol 108 (90 Base) MCG/ACT inhaler Commonly known as: ProAir HFA Inhale 2 puffs into the lungs every 6 (six) hours as needed for wheezing or shortness of breath. With spacer   cetirizine 5 MG tablet Commonly known as: ZYRTEC Take 1 tablet (5 mg total) by mouth daily as needed.   EPINEPHrine 0.3 mg/0.3 mL Soaj injection Commonly known as: EPI-PEN Inject 0.3 mLs (0.3 mg total) into the muscle as needed for anaphylaxis.   famotidine 40 MG tablet Commonly known as: PEPCID Take 1 tablet (40 mg total) by mouth 2 (two) times daily. What changed:   when to take this  reasons to take this   Fluocinolone Acetonide Scalp 0.01 % Oil APP TO THE SCALP ONCE D   fluticasone 110 MCG/ACT inhaler Commonly known as: Flovent HFA Inhale 2 puffs into the lungs 2 (two) times daily as needed.   fluticasone 50 MCG/ACT nasal spray Commonly known as: FLONASE Place 1 spray into both nostrils as needed for allergies or rhinitis.   hydrOXYzine 10 MG tablet Commonly known as: ATARAX/VISTARIL Take 20 mg by mouth as directed.   loratadine 10 MG tablet Commonly known as: CLARITIN Take 1 tablet (10 mg total) by mouth daily as needed.   nitrofurantoin (macrocrystal-monohydrate) 100 MG capsule Commonly known as: MACROBID Take 1 tablet after intercourse   omeprazole 20 MG capsule Commonly known as: PriLOSEC Take 1  capsule (20 mg total) by mouth daily.   ondansetron 4 MG tablet Commonly known as: ZOFRAN Take 4 mg by mouth every 8 (eight) hours as needed for nausea or vomiting.   Selenium Sulfide 2.3 % Sham 1 APPLICATION TO SCALP APPLY D PRN FOR DRANDUFF   Spacer/Aero-Holding [employer] Use as directed with albuterol hfa inhaler.       Known medication allergies: Allergies  Allergen Reactions  . Benadryl [Diphenhydramine]     Chest Pain  . Other     Tree nuts   . Peanut-Containing Drug Products Anaphylaxis  .  Corn-Containing Products   . Soy Allergy   . Tobramycin Other (See Comments)    Caused dizziness  . Ketoconazole Rash    Physical examination: Blood pressure 116/70, pulse 75, temperature 97.7 F (36.5 C), temperature source Temporal, resp. rate 18, SpO2 97 %.  General: Alert, interactive, in no acute distress. HEENT: PERRLA, TMs pearly gray, turbinates non-edematous without discharge, post-pharynx non erythematous. Neck: Supple without lymphadenopathy. Lungs: Clear to auscultation without wheezing, rhonchi or rales. {no increased work of breathing. CV: Normal S1, S2 without murmurs. Abdomen: Nondistended, nontender. Skin: Warm and dry, without lesions or rashes. Extremities:  No clubbing, cyanosis or edema. Neuro:   Grossly intact.  Diagnositics/Labs: None today  Assessment and plan:   Allergic reaction/Food allergy  Allergic rhinitis with OAS GERD Weight loss Palpitations Reactive airway, allergen driven   - Continue medications below:                A.  Dymista 1 spray each nostril twice a day or Flonase (if dymisata unavailable) 1-2 sprays each nostril daily for nasal congestion/drainage               B.  Albuterol inhaler 2 puffs every 4 hours as needed for cough, wheeze or shortness of breath    C.  Flovent 138mcg 2 puffs twice a day                D.  Allegra 180mg  daily as needed               E.  Famotidine 20 mg as needed    F.  Hydroxyzine 10 mg - 1-2 tablets up to 3 times a day as needed for allergic reaction    H.  Have access to your Epipen    I.   Use prednisone 10mg  -20mg  as needed for allergic reaction management     J.  Omeprazole 20mg  daily  - has become symptomatic with use of specialty MVI thus avoiding at this time - Continue allergen immunotherapy (allergy shots) weekly injections.  Bring your Epipen on days of your injections.   Continue antihistamine pre-medication regimen prior to allergy shot - Continue to keep track of reactions and note any  triggers.  Will plan to perform next challenge to cooked rice  - will hold off on Xolair until fall/winter and will re-visit as additional method to help with pollen food allergy syndrome as well as to help decrease risk of symptoms - will await results and further recommendations from Dr. Lloyd Huger.   - Follow-up with cardiology in Oct 2020 - new referral for nutritionist in place, Antonieta Iba RD - ENT, GI and cardiology evaluations has been reassuring to-date.   I appreciate the opportunity to take part in Mayli's care. Please do not hesitate to contact me with questions.  Sincerely,   Prudy Feeler, MD Allergy/Immunology [facility]

## 2019-09-10 ENCOUNTER — Telehealth: Payer: Self-pay

## 2019-09-10 ENCOUNTER — Ambulatory Visit (INDEPENDENT_AMBULATORY_CARE_PROVIDER_SITE_OTHER): Admitting: *Deleted

## 2019-09-10 DIAGNOSIS — J309 Allergic rhinitis, unspecified: Secondary | ICD-10-CM

## 2019-09-10 NOTE — Telephone Encounter (Signed)
Per Dr. Nelva Bush request a letter was generated and sent to patient via [place].

## 2019-09-11 ENCOUNTER — Ambulatory Visit: Payer: No Typology Code available for payment source | Admitting: Cardiology

## 2019-09-11 NOTE — Progress Notes (Deleted)
Cardiology Office Note:    Date:  09/11/2019   ID:  [name] [name], DOB 1986-01-21, MRN [id number]  PCP:  Vernie Shanks, MD  Cardiologist:  Shirlee More, MD    Referring MD: Vernie Shanks, MD    ASSESSMENT:    No diagnosis found. PLAN:    In order of problems listed above:  1. ***   Next appointment: ***   Medication Adjustments/Labs and Tests Ordered: Current medicines are reviewed at length with the patient today.  Concerns regarding medicines are outlined above.  No orders of the defined types were placed in this encounter.  No orders of the defined types were placed in this encounter.   No chief complaint on file.   History of Present Illness:    [name] [name] is a 33 y.o. female with a hx of allergy and palpitation last seen 07/30/2019. Compliance with diet, lifestyle and medications: ***  Study Highlights  A Zio monitor was applied 7 days 14 hours to assess palpitation ventricular arrhythmia beginning 07/17/2019.  The rhythm throughout is sinus with minimum average and maximum heart rates of 51, 83 83 and 176 bpm.  There were no pauses of 3 seconds or greater and no episodes of sinus node or AV nodal block.  There were rare APCs noted no episodes of SVT atrial fibrillation or flutter.  There were rare PVCs noted no couplets triplets or VT.  There were 15 triggered events 2 of the episodes associated with rare PVCs the other sinus rhythm   Conclusion rare atrial premature contractions and PVCs, at times triggered events are associated with PVCs    Past Medical History:  Diagnosis Date  . ADHD   . Angio-edema   . Asthma   . PVC (premature ventricular contraction)   . UTI (urinary tract infection)    hx of freq in 2018    Past Surgical History:  Procedure Laterality Date  . ESOPHAGOGASTRODUODENOSCOPY    . LEEP  2010  . WISDOM TOOTH EXTRACTION  2008    Current Medications: No outpatient medications have been marked as taking for  the 09/11/19 encounter (Appointment) with Richardo Priest, MD.     Allergies:   Benadryl [diphenhydramine], Other, Peanut-containing drug products, Corn-containing products, Soy allergy, Tobramycin, and Ketoconazole   Social History   Socioeconomic History  . Marital status: Married    Spouse name: Not on file  . Number of children: 2  . Years of education: doctorate DNP  . Highest education level: Not on file  Occupational History    Comment: [employer], NP  Social Needs  . Financial resource strain: Not on file  . Food insecurity    Worry: Not on file    Inability: Not on file  . Transportation needs    Medical: Not on file    Non-medical: Not on file  Tobacco Use  . Smoking status: Never Smoker  . Smokeless tobacco: Never Used  Substance and Sexual Activity  . Alcohol use: Yes    Comment: 1-2 glasses wine/weekly  . Drug use: No  . Sexual activity: Not on file  Lifestyle  . Physical activity    Days per week: Not on file    Minutes per session: Not on file  . Stress: Not on file  Relationships  . Social Herbalist on phone: Not on file    Gets together: Not on file    Attends religious service: Not on file    Active  member of club or organization: Not on file    Attends meetings of clubs or organizations: Not on file    Relationship status: Not on file  Other Topics Concern  . Not on file  Social History Narrative   Lives with husband, children.  Works at [employer].  Education: doctorate level.    Tea/coffee 2-5 x week     Family History: The patient's ***family history includes Cancer in her father; Diabetes in her father, maternal grandmother, and mother; Eczema in her sister; Heart disease in her father; Hyperthyroidism in her mother. There is no history of Allergic rhinitis, Asthma, Urticaria, Colon cancer, Esophageal cancer, Stomach cancer, or Rectal cancer. ROS:   Please see the history of present illness.    All other systems reviewed and are  negative.  EKGs/Labs/Other Studies Reviewed:    The following studies were reviewed today:  EKG:  EKG ordered today and personally reviewed.  The ekg ordered today demonstrates ***  Recent Labs: 01/08/2019: BUN 18; Creatinine, Ser 0.94; Hemoglobin 13.3; Platelets 268; Potassium 3.1; Sodium 136  Recent Lipid Panel No results found for: CHOL, TRIG, HDL, CHOLHDL, VLDL, LDLCALC, LDLDIRECT  Physical Exam:    VS:  There were no vitals taken for this visit.    Wt Readings from Last 3 Encounters:  08/11/19 121 lb 6.4 oz (55.1 kg)  07/30/19 121 lb 9.6 oz (55.2 kg)  07/20/19 120 lb (54.4 kg)     GEN: *** Well nourished, well developed in no acute distress HEENT: Normal NECK: No JVD; No carotid bruits LYMPHATICS: No lymphadenopathy CARDIAC: ***RRR, no murmurs, rubs, gallops RESPIRATORY:  Clear to auscultation without rales, wheezing or rhonchi  ABDOMEN: Soft, non-tender, non-distended MUSCULOSKELETAL:  No edema; No deformity  SKIN: Warm and dry NEUROLOGIC:  Alert and oriented x 3 PSYCHIATRIC:  Normal affect    Signed, Norman Herrlich, MD  09/11/2019 7:39 AM    [facility]

## 2019-09-17 ENCOUNTER — Ambulatory Visit (INDEPENDENT_AMBULATORY_CARE_PROVIDER_SITE_OTHER)

## 2019-09-17 DIAGNOSIS — J309 Allergic rhinitis, unspecified: Secondary | ICD-10-CM

## 2019-09-24 ENCOUNTER — Ambulatory Visit (INDEPENDENT_AMBULATORY_CARE_PROVIDER_SITE_OTHER)

## 2019-09-24 DIAGNOSIS — J309 Allergic rhinitis, unspecified: Secondary | ICD-10-CM | POA: Diagnosis not present

## 2019-09-25 ENCOUNTER — Encounter: Payer: No Typology Code available for payment source | Admitting: Family Medicine

## 2019-09-30 ENCOUNTER — Encounter: Payer: No Typology Code available for payment source | Admitting: Allergy

## 2019-10-01 ENCOUNTER — Ambulatory Visit (INDEPENDENT_AMBULATORY_CARE_PROVIDER_SITE_OTHER): Admitting: *Deleted

## 2019-10-01 DIAGNOSIS — J309 Allergic rhinitis, unspecified: Secondary | ICD-10-CM

## 2019-10-06 ENCOUNTER — Telehealth: Payer: Self-pay | Admitting: *Deleted

## 2019-10-06 NOTE — Telephone Encounter (Signed)
Ok thanks.    Wonder if the bread was the culprit as I know she has not been eating much processed breads.  If only taking the pepcid once a day would increase to BID dosing and continue daily omeprazole.   Do not feel she needs to take additional prednisone or do a taper if most symptoms have resolved and only with heartburn at this time.

## 2019-10-06 NOTE — Telephone Encounter (Signed)
Oh no.   Ok if skin testing she will need to hold antihistamine thus would recommend visit someday before she is set for next allergy shot.

## 2019-10-06 NOTE — Telephone Encounter (Signed)
Called and spoke with patient, patient verbalized understanding. Patient states that she had an allergic reaction to 1 piece of shrimp and since she does not have a previous allergy to shrimp she would like to test for that next week at her office visit.

## 2019-10-06 NOTE — Telephone Encounter (Signed)
[name] [name] called to report that she ate a [employer] sandwich on Saturday that had Turkey/Lettuce/Guacamole/Peppers on it. Afterwards she developed chest burning, flushing, abdominal upset, diarrhea, tachycardia, and heart burn. She states that she took Hydroxyzine, Omeprazole, Pepcid, Allegra, Zyrtec, and Prednisone 10mg  that day. All symptoms have resolved except for the heartburn. She wants to know if she needs to do a Prednisone taper or if there is something else she can do. She is still taking her Pepcid and Omeprazole daily. Please advise.

## 2019-10-07 ENCOUNTER — Encounter: Payer: No Typology Code available for payment source | Admitting: Allergy

## 2019-10-08 ENCOUNTER — Ambulatory Visit: Payer: Self-pay | Admitting: *Deleted

## 2019-10-14 ENCOUNTER — Encounter: Payer: No Typology Code available for payment source | Admitting: Allergy

## 2019-10-15 ENCOUNTER — Other Ambulatory Visit: Payer: Self-pay

## 2019-10-15 ENCOUNTER — Ambulatory Visit (INDEPENDENT_AMBULATORY_CARE_PROVIDER_SITE_OTHER): Admitting: Allergy

## 2019-10-15 ENCOUNTER — Encounter: Payer: Self-pay | Admitting: Allergy

## 2019-10-15 VITALS — BP 112/78 | HR 79 | Temp 98.1°F | Resp 16 | Ht 61.5 in | Wt 121.8 lb

## 2019-10-15 DIAGNOSIS — K2100 Gastro-esophageal reflux disease with esophagitis, without bleeding: Secondary | ICD-10-CM

## 2019-10-15 DIAGNOSIS — T7840XD Allergy, unspecified, subsequent encounter: Secondary | ICD-10-CM

## 2019-10-15 DIAGNOSIS — T781XXD Other adverse food reactions, not elsewhere classified, subsequent encounter: Secondary | ICD-10-CM | POA: Diagnosis not present

## 2019-10-15 DIAGNOSIS — J4540 Moderate persistent asthma, uncomplicated: Secondary | ICD-10-CM

## 2019-10-15 DIAGNOSIS — J3089 Other allergic rhinitis: Secondary | ICD-10-CM

## 2019-10-15 DIAGNOSIS — R634 Abnormal weight loss: Secondary | ICD-10-CM

## 2019-10-15 MED ORDER — OMEPRAZOLE 20 MG PO CPDR: 20.0000 mg | DELAYED_RELEASE_CAPSULE | Freq: Every day | ORAL | 1 refills | Status: DC

## 2019-10-15 MED ORDER — LORATADINE 10 MG PO TABS: 10.0000 mg | ORAL_TABLET | Freq: Every day | ORAL | 1 refills | Status: AC | PRN

## 2019-10-15 MED ORDER — CROMOLYN SODIUM 100 MG/5ML PO CONC: ORAL | 0 refills | Status: DC

## 2019-10-15 NOTE — Patient Instructions (Addendum)
Allergic reaction/Food allergy  Allergic rhinitis with OAS GERD Weight loss Palpitations Reactive airway, allergen driven   - Continue medications below:                A.  Dymista 1 spray each nostril twice a day or Flonase (if dymisata unavailable) 1-2 sprays each nostril daily for nasal congestion/drainage               B.  Albuterol inhaler 2 puffs every 4 hours as needed for cough, wheeze or shortness of breath    C.  Flovent 132mcg 2 puffs twice a day                D.  Allegra 180mg  daily as needed               E.  Famotidine 20 mg as needed    F.  Hydroxyzine 10 mg - 1-2 tablets up to 3 times a day as needed for allergic reaction    H.  Have access to your Epipen    I.   Use prednisone 10mg  -20mg  as needed for allergic reaction management     J.  Omeprazole 20-40mg  daily  -discussed possibility of having mast cell activation syndrome which can be a spectrum of mastocytosis.  With normal tryptase on multiple draws mastocytosis is much less likely however could be on the spectrum with continued allergic reaction symptoms.   Have advised to obtain 24hr urine studies if she develops symptoms to collect and see if she is having elevation of mast cell derivatives during these attacks.   Also discussed trial of Cromolyn prior to meals (that are not her "safe" foods).   - has become symptomatic with use of specialty MVI thus avoiding at this time - Continue allergen immunotherapy (allergy shots) weekly injections.  Bring your Epipen on days of your injections.   Continue antihistamine pre-medication regimen prior to allergy shot.  We are getting very close to maintenance dosing. - Continue to keep track of reactions and note any triggers.   syndrome as well as to help decrease risk of symptoms - has been evaluated by Dr. Lloyd Huger at [place].   Following with cardiology for palpitations.  ENT, GI and cardiology evaluations has been reassuring to-date.  - referral has been  placed for nutritionist, Antonieta Iba RD with appt in Dec (earliest available)

## 2019-10-15 NOTE — Progress Notes (Signed)
Follow-up Note  RE: [name] [name] MRN: [id number] DOB: [birth year] Date of Office Visit: 10/15/2019   History of present illness: [name] [name] is a 33 y.o. female presenting today for follow-up.  She has history of allergic reactions related to food allergy with oral allergy syndrome, allergic rhinitis, GERD, reactive airway.  She was last seen in the office on September 09, 2019 by myself. She states she had been doing well up until when she received an allergy shot of the red vial at 0.1 and had a large local about the size of a quarter.  She states of the following days she had issues where she was having more reactions after food.  She states she started off with eating a spoonful of rice that went okay and then she introduced potato that went okay however later that day she decided to have a a subsandwich from [employer] which she states she was hungry and really wanted to eat it and then developed symptoms following ingestion.  She also reports she has had symptoms of with shrimp as well as salmon.  The symptoms include her mouth feeling "thick", flushed, headache, abdominal cramping and of a thick throat sensation.  She also reports with salmon ingestion that she had diarrhea as well itchy hands and feet.  However she also states that she was able to reintroduce solids where she has been eating tomato, cucumber onions and a salad dressing without any issue.  This is positive.  Hip she states she continues to have issues with her GI tract.  This is been a longstanding issue over the past year.  She is taking Pepcid and omeprazole to help with reflux symptoms.  She does endorse a lot of heartburn. Due to the past reactions after getting her allergy shot she has not had her allergy shot in 2 weeks now.  Review of systems: Review of Systems  Constitutional: Positive for malaise/fatigue and weight loss. Negative for chills and fever.  HENT: Positive for sore throat. Negative for congestion, ear  discharge, nosebleeds and sinus pain.   Eyes: Negative for pain, discharge and redness.  Respiratory: Positive for shortness of breath.   Cardiovascular: Negative.   Gastrointestinal: Positive for abdominal pain, diarrhea and heartburn.  Musculoskeletal: Negative.   Skin: Positive for itching. Negative for rash.  Neurological: Positive for headaches.    All other systems negative unless noted above in HPI  Past medical/social/surgical/family history have been reviewed and are unchanged unless specifically indicated below.  No changes  Medication List: Current Outpatient Medications  Medication Sig Dispense Refill  . albuterol (PROAIR HFA) 108 (90 Base) MCG/ACT inhaler Inhale 2 puffs into the lungs every 6 (six) hours as needed for wheezing or shortness of breath. With spacer 18 g 1  . cetirizine (ZYRTEC) 5 MG tablet Take 1 tablet (5 mg total) by mouth daily as needed. 90 tablet 1  . EPINEPHrine 0.3 mg/0.3 mL IJ SOAJ injection Inject 0.3 mLs (0.3 mg total) into the muscle as needed for anaphylaxis. 1 Device 2  . famotidine (PEPCID) 40 MG tablet Take 1 tablet (40 mg total) by mouth 2 (two) times daily. (Patient taking differently: Take 40 mg by mouth as needed. ) 60 tablet 5  . Fluocinolone Acetonide Scalp 0.01 % OIL APP TO THE SCALP ONCE D    . fluticasone (FLONASE) 50 MCG/ACT nasal spray Place 1 spray into both nostrils as needed for allergies or rhinitis. 16 g 5  . fluticasone (FLOVENT HFA) 110 MCG/ACT  inhaler Inhale 2 puffs into the lungs 2 (two) times daily as needed. 1 Inhaler 12  . hydrOXYzine (ATARAX/VISTARIL) 10 MG tablet Take 20 mg by mouth as directed.     . loratadine (CLARITIN) 10 MG tablet Take 1 tablet (10 mg total) by mouth daily as needed. 90 tablet 1  . nitrofurantoin, macrocrystal-monohydrate, (MACROBID) 100 MG capsule Take 1 tablet after intercourse    . omeprazole (PRILOSEC) 20 MG capsule Take 1 capsule (20 mg total) by mouth daily. 90 capsule 1  . ondansetron  (ZOFRAN) 4 MG tablet Take 4 mg by mouth every 8 (eight) hours as needed for nausea or vomiting.    . Selenium Sulfide 2.3 % SHAM 1 APPLICATION TO SCALP APPLY D PRN FOR DRANDUFF    . Spacer/Aero-Holding Rudean Curt Use as directed with albuterol hfa inhaler. 1 each 0   No current facility-administered medications for this visit.      Known medication allergies: Allergies  Allergen Reactions  . Benadryl [Diphenhydramine]     Chest Pain  . Other     Tree nuts   . Peanut-Containing Drug Products Anaphylaxis  . Corn-Containing Products   . Soy Allergy   . Tobramycin Other (See Comments)    Caused dizziness  . Ketoconazole Rash     Physical examination: Blood pressure 112/78, pulse 79, temperature 98.1 F (36.7 C), temperature source Temporal, resp. rate 16, height 5' 1.5" (1.562 m), weight 121 lb 12.8 oz (55.2 kg), SpO2 100 %.  General: Alert, interactive, in no acute distress. HEENT: PERRLA, TMs pearly gray, turbinates minimally edematous without discharge, post-pharynx non erythematous. Neck: Supple without lymphadenopathy. Lungs: Clear to auscultation without wheezing, rhonchi or rales. {no increased work of breathing. CV: Normal S1, S2 without murmurs. Abdomen: Nondistended, nontender. Skin: Warm and dry, without lesions or rashes. Extremities:  No clubbing, cyanosis or edema. Neuro:   Grossly intact.  Diagnositics/Labs: Allergy testing: Select food allergy skin prick testing is positive to soybean, peanut and almond.    Assessment and plan:   Allergic reaction/Food allergy  Allergic rhinitis with OAS GERD Weight loss Palpitations Reactive airway, allergen driven   - Continue medications below:                A.  Dymista 1 spray each nostril twice a day or Flonase (if dymisata unavailable) 1-2 sprays each nostril daily for nasal congestion/drainage               B.  Albuterol inhaler 2 puffs every 4 hours as needed for cough, wheeze or shortness of breath       C.  Flovent 2 puffs twice a day                D.  Allegra 180mg  daily as needed               E.  Famotidine 20 mg as needed      F.  Hydroxyzine 10 mg - 1-2 tablets up to 3 times a day as needed for allergic reaction      H.  Have access to your Epipen      I.   Use prednisone 10mg  -20mg  as needed for allergic reaction management       J.  Omeprazole 20-40mg  daily  -discussed possibility of having mast cell activation syndrome which can be a spectrum of mastocytosis.  With normal tryptase on multiple draws mastocytosis is much less likely however could be on the spectrum with continued allergic  reaction symptoms.   Have advised to obtain 24hr urine studies if she develops symptoms to collect and see if she is having elevation of mast cell derivatives during these attacks.   Also discussed trial of Cromolyn prior to meals (that are not her "safe" foods).   - has become symptomatic with use of specialty MVI thus avoiding at this time - Continue allergen immunotherapy (allergy shots) weekly injections.  Bring your Epipen on days of your injections.   Continue antihistamine pre-medication regimen prior to allergy shot.  We are getting very close to maintenance dosing. - Continue to keep track of reactions and note any triggers.   syndrome as well as to help decrease risk of symptoms - has been evaluated by Dr. Lloyd Huger at [place].   Following with cardiology for palpitations.  ENT, GI and cardiology evaluations has been reassuring to-date.  - referral has been placed for nutritionist, Antonieta Iba RD with appt in Dec (earliest available)  I appreciate the opportunity to take part in [place] care. Please do not hesitate to contact me with questions.  Sincerely,   Prudy Feeler, MD Allergy/Immunology [facility]

## 2019-10-16 ENCOUNTER — Ambulatory Visit (INDEPENDENT_AMBULATORY_CARE_PROVIDER_SITE_OTHER)

## 2019-10-16 DIAGNOSIS — J309 Allergic rhinitis, unspecified: Secondary | ICD-10-CM

## 2019-10-27 ENCOUNTER — Ambulatory Visit (INDEPENDENT_AMBULATORY_CARE_PROVIDER_SITE_OTHER): Admitting: *Deleted

## 2019-10-27 DIAGNOSIS — J309 Allergic rhinitis, unspecified: Secondary | ICD-10-CM | POA: Diagnosis not present

## 2019-10-28 ENCOUNTER — Encounter: Payer: Self-pay | Admitting: *Deleted

## 2019-10-29 ENCOUNTER — Ambulatory Visit: Payer: Self-pay

## 2019-10-29 ENCOUNTER — Other Ambulatory Visit: Payer: Self-pay

## 2019-10-29 ENCOUNTER — Ambulatory Visit (INDEPENDENT_AMBULATORY_CARE_PROVIDER_SITE_OTHER)

## 2019-10-29 DIAGNOSIS — Z23 Encounter for immunization: Secondary | ICD-10-CM

## 2019-10-29 NOTE — Progress Notes (Signed)
Patient received Flucelvax (egg-free vaccine). Patient waited 30 minutes in vehicle post injection. No systemic symptoms or local reactions noted.

## 2019-11-09 ENCOUNTER — Telehealth: Payer: Self-pay

## 2019-11-09 NOTE — Telephone Encounter (Signed)
Disability forms received and awaiting for Dr. Nelva Bush to sign. Placed in her office.

## 2019-11-10 ENCOUNTER — Ambulatory Visit (INDEPENDENT_AMBULATORY_CARE_PROVIDER_SITE_OTHER): Admitting: *Deleted

## 2019-11-10 DIAGNOSIS — J309 Allergic rhinitis, unspecified: Secondary | ICD-10-CM | POA: Diagnosis not present

## 2019-11-10 NOTE — Telephone Encounter (Signed)
Received will complete 

## 2019-11-18 ENCOUNTER — Ambulatory Visit (INDEPENDENT_AMBULATORY_CARE_PROVIDER_SITE_OTHER): Admitting: Allergy

## 2019-11-18 ENCOUNTER — Other Ambulatory Visit: Payer: Self-pay

## 2019-11-18 ENCOUNTER — Encounter: Payer: Self-pay | Admitting: Allergy

## 2019-11-18 VITALS — BP 112/82 | HR 99 | Temp 98.0°F | Resp 16 | Ht 61.5 in | Wt 122.2 lb

## 2019-11-18 DIAGNOSIS — R634 Abnormal weight loss: Secondary | ICD-10-CM

## 2019-11-18 DIAGNOSIS — J4540 Moderate persistent asthma, uncomplicated: Secondary | ICD-10-CM | POA: Diagnosis not present

## 2019-11-18 DIAGNOSIS — T781XXD Other adverse food reactions, not elsewhere classified, subsequent encounter: Secondary | ICD-10-CM | POA: Diagnosis not present

## 2019-11-18 DIAGNOSIS — R002 Palpitations: Secondary | ICD-10-CM

## 2019-11-18 DIAGNOSIS — K2100 Gastro-esophageal reflux disease with esophagitis, without bleeding: Secondary | ICD-10-CM

## 2019-11-18 DIAGNOSIS — J3089 Other allergic rhinitis: Secondary | ICD-10-CM | POA: Diagnosis not present

## 2019-11-18 DIAGNOSIS — T7840XD Allergy, unspecified, subsequent encounter: Secondary | ICD-10-CM

## 2019-11-18 NOTE — Progress Notes (Signed)
Follow-up Note  RE: [name] [name] MRN: [id number] DOB: 1986-07-24 Date of Office Visit: 11/18/2019   History of present illness: [name] [name] is a 33 y.o. female presenting today for follow-up of allergic reactions with pollen food allergy syndrome, allergic rhinitis primarily.  She was last seen in the office on 10/15/2019.   She continues on allergen immunotherapy now in the red vial nearing maintenance dose.  She is doing well with advancement.  She states she was able to introduce potatoes back in the diet which she is very excited about.  She also is eating salads and gaucamole with shredded chicken.   She does however states that after eating [place] at Thanksgiving she did develop flushing and itchy hands.   She also states she started back using the allergy based MVI this week without issue.   She states she hasn't had a frank reaction where she would perform the 24hr urine collection.  She has the jugs for collection. She states she should be able to work 20hrs/week that is flexible.    She has history of vestibular migraines and does follow with neurology.  She states she usually would have these migraines around her menstral cycle.  She is currently on her cycle now and states she has been having blurred vision and eye pain which is new for her.  She has appt with eye doctor this week.   She has appt with dietician on Friday of this week.   Review of systems: Review of Systems  Constitutional: Negative for chills, fever and malaise/fatigue.  HENT: Negative for congestion, ear discharge, nosebleeds and sore throat.   Eyes: Positive for blurred vision and pain. Negative for discharge and redness.  Respiratory: Negative.   Cardiovascular: Negative.   Gastrointestinal: Negative.   Musculoskeletal: Negative.   Skin: Positive for itching.  Neurological: Negative.     All other systems negative unless noted above in HPI  Past medical/social/surgical/family history have been  reviewed and are unchanged unless specifically indicated below.  No changes  Medication List: Current Outpatient Medications  Medication Sig Dispense Refill  . albuterol (PROAIR HFA) 108 (90 Base) MCG/ACT inhaler Inhale 2 puffs into the lungs every 6 (six) hours as needed for wheezing or shortness of breath. With spacer 18 g 1  . cetirizine (ZYRTEC) 5 MG tablet Take 1 tablet (5 mg total) by mouth daily as needed. 90 tablet 1  . cromolyn (GASTROCROM) 100 MG/5ML solution 100 mg three times daily prior to meals as needed. 600 mL 0  . EPINEPHrine 0.3 mg/0.3 mL IJ SOAJ injection Inject 0.3 mLs (0.3 mg total) into the muscle as needed for anaphylaxis. 1 Device 2  . famotidine (PEPCID) 40 MG tablet Take 1 tablet (40 mg total) by mouth 2 (two) times daily. (Patient taking differently: Take 40 mg by mouth as needed. ) 60 tablet 5  . Fluocinolone Acetonide Scalp 0.01 % OIL APP TO THE SCALP ONCE D    . fluticasone (FLONASE) 50 MCG/ACT nasal spray Place 1 spray into both nostrils as needed for allergies or rhinitis. 16 g 5  . fluticasone (FLOVENT HFA) 110 MCG/ACT inhaler Inhale 2 puffs into the lungs 2 (two) times daily as needed. 1 Inhaler 12  . hydrOXYzine (ATARAX/VISTARIL) 10 MG tablet Take 20 mg by mouth as directed.     . loratadine (CLARITIN) 10 MG tablet Take 1 tablet (10 mg total) by mouth daily as needed. 90 tablet 1  . nitrofurantoin, macrocrystal-monohydrate, (MACROBID) 100 MG capsule Take  1 tablet after intercourse    . omeprazole (PRILOSEC) 20 MG capsule Take 1 capsule (20 mg total) by mouth daily. 90 capsule 1  . ondansetron (ZOFRAN) 4 MG tablet Take 4 mg by mouth every 8 (eight) hours as needed for nausea or vomiting.    . Selenium Sulfide 2.3 % SHAM 1 APPLICATION TO SCALP APPLY D PRN FOR DRANDUFF    . Spacer/Aero-Holding Rudean Curt Use as directed with albuterol hfa inhaler. 1 each 0   No current facility-administered medications for this visit.      Known medication allergies:  Allergies  Allergen Reactions  . Benadryl [Diphenhydramine]     Chest Pain  . Other     Tree nuts   . Peanut-Containing Drug Products Anaphylaxis  . Corn-Containing Products   . Soy Allergy   . Tobramycin Other (See Comments)    Caused dizziness  . Ketoconazole Rash     Physical examination: Blood pressure 112/82, pulse 99, temperature 98 F (36.7 C), temperature source Temporal, resp. rate 16, height 5' 1.5" (1.562 m), weight 122 lb 3.2 oz (55.4 kg), SpO2 96 %.  General: Alert, interactive, in no acute distress. HEENT: PERRLA, TMs pearly gray, turbinates minimally edematous with clear discharge, post-pharynx non erythematous. Neck: Supple without lymphadenopathy. Lungs: Clear to auscultation without wheezing, rhonchi or rales. {no increased work of breathing. CV: Normal S1, S2 without murmurs. Abdomen: Nondistended, nontender. Skin: Warm and dry, without lesions or rashes. Extremities:  No clubbing, cyanosis or edema. Neuro:   Grossly intact.  Diagnositics/Labs: None today  Assessment and plan:   Allergic reaction/Food allergy  Allergic rhinitis with OAS GERD Weight loss Palpitations Reactive airway, allergen driven   - Continue medications below:                A.  Dymista 1 spray each nostril twice a day or Flonase (if dymista unavailable) 1-2 sprays each nostril daily for nasal congestion/drainage               B.  Albuterol inhaler 2 puffs every 4 hours as needed for cough, wheeze or shortness of breath    C.  Flovent 2 puffs twice a day                D.  Allegra 180mg  daily as needed               E.  Famotidine 20 mg as needed    F.  Hydroxyzine 10 mg - 1-2 tablets up to 3 times a day as needed for allergic reaction    H.  Have access to your Epipen    I.   Use prednisone 10mg  -20mg  as needed for allergic reaction management     J.  Omeprazole 20-40mg  daily  -discussed possibility of having mast cell activation syndrome which can be a spectrum of  mastocytosis.  With normal tryptase on multiple draws mastocytosis is much less likely however could be on the spectrum with continued allergic reaction symptoms.   Have advised to obtain 24hr urine studies if she develops symptoms to collect and see if she is having elevation of mast cell derivatives during these attacks.   We have also discussed trial of Cromolyn prior to meals (that are not her "safe" foods); will use if having degree of GI symptoms.   - has been able to re-introduce MVI as well as potato in the diet!  - Continue allergen immunotherapy (allergy shots) weekly injections.  Bring your Epipen on  days of your injections.   Continue antihistamine pre-medication regimen prior to allergy shot.  We are getting very close to maintenance dosing (red vial 0.61ml). - Continue to keep track of reactions and note any triggers.   - has been evaluated by Dr. Ellie Lunch at [facility] All[facility]/Immunology, Cardiology for palpitations.  ENT, GI and cardiology evaluations has been reassuring to-date.  - has upcoming appt for dietician, Oran Rein RD  I appreciate the opportunity to take part in [place]'s care. Please do not hesitate to contact me with questions.  Sincerely,   Margo Aye, MD Allergy/Immunology [facility]

## 2019-11-18 NOTE — Patient Instructions (Signed)
Allergic reaction/Food allergy  Allergic rhinitis with OAS GERD Weight loss Palpitations Reactive airway, allergen driven   - Continue medications below:                A.  Dymista 1 spray each nostril twice a day or Flonase (if dymista unavailable) 1-2 sprays each nostril daily for nasal congestion/drainage               B.  Albuterol inhaler 2 puffs every 4 hours as needed for cough, wheeze or shortness of breath    C.  Flovent 127mcg 2 puffs twice a day                D.  Allegra 180mg  daily as needed               E.  Famotidine 20 mg as needed    F.  Hydroxyzine 10 mg - 1-2 tablets up to 3 times a day as needed for allergic reaction    H.  Have access to your Epipen    I.   Use prednisone 10mg  -20mg  as needed for allergic reaction management     J.  Omeprazole 20-40mg  daily  -discussed possibility of having mast cell activation syndrome which can be a spectrum of mastocytosis.  With normal tryptase on multiple draws mastocytosis is much less likely however could be on the spectrum with continued allergic reaction symptoms.   Have advised to obtain 24hr urine studies if she develops symptoms to collect and see if she is having elevation of mast cell derivatives during these attacks.   We have also discussed trial of Cromolyn prior to meals (that are not her "safe" foods); will use if having degree of GI symptoms.   - has been able to re-introduce MVI as well as potato in the diet!  - Continue allergen immunotherapy (allergy shots) weekly injections.  Bring your Epipen on days of your injections.   Continue antihistamine pre-medication regimen prior to allergy shot.  We are getting very close to maintenance dosing (red vial 0.5ml). - Continue to keep track of reactions and note any triggers.   - has been evaluated by Dr. Lloyd Huger at [facility] Allergy/Immunology, Cardiology for palpitations.  ENT, GI and cardiology evaluations has been reassuring to-date.  - has upcoming appt for dietician, Antonieta Iba RD

## 2019-11-20 ENCOUNTER — Ambulatory Visit: Payer: No Typology Code available for payment source | Admitting: Dietician

## 2019-11-26 ENCOUNTER — Ambulatory Visit (INDEPENDENT_AMBULATORY_CARE_PROVIDER_SITE_OTHER)

## 2019-11-26 DIAGNOSIS — J309 Allergic rhinitis, unspecified: Secondary | ICD-10-CM | POA: Diagnosis not present

## 2019-12-08 ENCOUNTER — Ambulatory Visit (INDEPENDENT_AMBULATORY_CARE_PROVIDER_SITE_OTHER)

## 2019-12-08 DIAGNOSIS — J309 Allergic rhinitis, unspecified: Secondary | ICD-10-CM | POA: Diagnosis not present

## 2019-12-10 ENCOUNTER — Telehealth: Payer: Self-pay | Admitting: *Deleted

## 2019-12-10 NOTE — Telephone Encounter (Signed)
Message content copied and sent to patient via mychart message as instructed.

## 2019-12-10 NOTE — Telephone Encounter (Signed)
Patient was wondering if she should get the COVID vaccine give her history of allergic reactions. Patient would like a MyChart message sent to her regarding this. Please advise.

## 2019-12-10 NOTE — Telephone Encounter (Signed)
Please send via Mychart.    Hi there!    - I heard you made it to maintenance dosing and by the end the year!!!      - regarding the Covid vaccine I would recommend you receive the vaccine.  At this moment the only people with concern who should not receive the vaccine are those who have had previous vaccine reactions related to polyethylene glycol.  People with history of environmental and food allergy are recommended to receive the vaccine.  I would however recommend that you have access to your epinephrine device when you get the vaccine and would recommend you premedicate like you do prior to coming to your allergy injections.  You also should not receive the vaccine or your allergy injections within 48 hours of each other thus would need to plan appropriately when you are going to get your Covid vaccine.  You should also wait at least 30 minutes on site where you get your vaccine to ensure that you will not have any reaction or adverse events.  Dr. Mamie Nick

## 2019-12-17 ENCOUNTER — Ambulatory Visit (INDEPENDENT_AMBULATORY_CARE_PROVIDER_SITE_OTHER)

## 2019-12-17 DIAGNOSIS — J309 Allergic rhinitis, unspecified: Secondary | ICD-10-CM

## 2019-12-17 NOTE — Telephone Encounter (Signed)
Letter generated waiting on signature.

## 2019-12-17 NOTE — Telephone Encounter (Signed)
Can someone put this in a letter and fax to number in the previous message.   ---------------------------------------------------  To whom it may concern:              :pt name: is under my care at the [facility] for history of allergic reactions in relationship to food and environmental allergies.  She should be able and allowed for trial to return to work and perform work duties for average of 16 hours per week.  She would be best suited for work done virtually if possible or to have a flexible schedule in place.    Sincerely,     Margo Aye, MD [facility]

## 2019-12-24 ENCOUNTER — Ambulatory Visit (INDEPENDENT_AMBULATORY_CARE_PROVIDER_SITE_OTHER)

## 2019-12-24 DIAGNOSIS — J309 Allergic rhinitis, unspecified: Secondary | ICD-10-CM

## 2020-01-07 ENCOUNTER — Ambulatory Visit (INDEPENDENT_AMBULATORY_CARE_PROVIDER_SITE_OTHER)

## 2020-01-07 DIAGNOSIS — J309 Allergic rhinitis, unspecified: Secondary | ICD-10-CM | POA: Diagnosis not present

## 2020-01-14 ENCOUNTER — Ambulatory Visit (INDEPENDENT_AMBULATORY_CARE_PROVIDER_SITE_OTHER)

## 2020-01-14 DIAGNOSIS — J309 Allergic rhinitis, unspecified: Secondary | ICD-10-CM | POA: Diagnosis not present

## 2020-01-28 ENCOUNTER — Ambulatory Visit: Payer: Self-pay | Admitting: Allergy

## 2020-02-08 ENCOUNTER — Telehealth: Payer: Self-pay

## 2020-02-08 NOTE — Telephone Encounter (Signed)
-----   Message from Exie Parody, [place] sent at 02/04/2020  7:09 PM EST ----- Regarding: Schedule Patient Have patient come in for appt wed, thur or fri next week with dr Delorse Lek. Arrival time 1100/1115. Schedule slot at 1140. Premedicate for allergy injection that day.

## 2020-02-08 NOTE — Telephone Encounter (Signed)
I left a message for patient to call back to speak with me regarding this per Dr. Randell Patient instructions.

## 2020-02-10 ENCOUNTER — Encounter: Payer: Self-pay | Admitting: Allergy

## 2020-02-10 ENCOUNTER — Ambulatory Visit (INDEPENDENT_AMBULATORY_CARE_PROVIDER_SITE_OTHER): Admitting: Allergy

## 2020-02-10 ENCOUNTER — Other Ambulatory Visit: Payer: Self-pay

## 2020-02-10 VITALS — BP 106/72 | HR 96 | Temp 98.0°F | Resp 16 | Wt 130.0 lb

## 2020-02-10 DIAGNOSIS — J4540 Moderate persistent asthma, uncomplicated: Secondary | ICD-10-CM | POA: Diagnosis not present

## 2020-02-10 DIAGNOSIS — J3089 Other allergic rhinitis: Secondary | ICD-10-CM

## 2020-02-10 DIAGNOSIS — R002 Palpitations: Secondary | ICD-10-CM

## 2020-02-10 DIAGNOSIS — T781XXD Other adverse food reactions, not elsewhere classified, subsequent encounter: Secondary | ICD-10-CM | POA: Diagnosis not present

## 2020-02-10 DIAGNOSIS — R634 Abnormal weight loss: Secondary | ICD-10-CM

## 2020-02-10 DIAGNOSIS — T7819XD Other adverse food reactions, not elsewhere classified, subsequent encounter: Secondary | ICD-10-CM

## 2020-02-10 DIAGNOSIS — T7840XD Allergy, unspecified, subsequent encounter: Secondary | ICD-10-CM

## 2020-02-10 DIAGNOSIS — K2100 Gastro-esophageal reflux disease with esophagitis, without bleeding: Secondary | ICD-10-CM

## 2020-02-10 NOTE — Progress Notes (Signed)
Follow-up Note  RE: [name] [name] MRN: [id number] DOB: 1986/04/29 Date of Office Visit: 02/10/2020   History of present illness: [name] [name] is a 34 y.o. female presenting today for follow-up of allergic reaction with food pollen allergy syndrome, allergic rhinitis, GERD, reactive airway.  She has also had weight loss due to recurrent reactions leading to decreased food intake.  Her last visit in the office was November 18, 2019 by myself.  Since this time she has gained 13 pounds which is very happy about.  She states from a dietary standpoint that a family member did move in with her family and has been very helpful in making her safe meals.  She states she has been able to reintroduce potatoes back into her diet which has been very helpful.  She also states she is eating string beans normal.  She would like to be able to perform an office challenges to ensure safety of foods in her diet.  She is on allergen immunotherapy at the maintenance dosing and she does have some small local reactions however they are not bothersome.  She does have a premedication regimen for her immunotherapy injections including her Allegra and hydroxyzine as well as Pepcid.  She states she is also noted some increase in reflux and has started her omeprazole for 2-week duration.  About 3 to 4 weeks ago she developed a sinus infection-like symptoms with ear pain increasing nasal congestion and drainage.  She states she just felt terrible.  She saw her PCP who diagnosed her with a otitis media with sinus infection and she was treated with amoxicillin course.  This helped but she states it took a while for her to see any symptom relief.  She still has some nasal congestion today.  She is using Flonase which she does feel has been helpful.  She also states she is needed to use her Flovent about every other day. And other good news she states she did go running outside with her mask on and was able to run a mile she states she did  feel a bit bad on the days afterwards but believes this is related to being deconditioned. In regards to her working she did apply and received a full scholarship for cardiovascular and feet training where she potentially could perform more remote work after training.  Review of systems: Review of Systems  Constitutional: Negative.  Negative for weight loss.  HENT: Positive for congestion, ear pain and sinus pain.   Eyes: Negative.   Respiratory: Positive for cough and shortness of breath.   Cardiovascular: Positive for palpitations (With antihistamine use).  Gastrointestinal: Positive for heartburn.  Musculoskeletal: Negative.   Skin: Negative.   Neurological: Negative.     All other systems negative unless noted above in HPI  Past medical/social/surgical/family history have been reviewed and are unchanged unless specifically indicated below.  No changes  Medication List: Current Outpatient Medications  Medication Sig Dispense Refill  . albuterol (PROAIR HFA) 108 (90 Base) MCG/ACT inhaler Inhale 2 puffs into the lungs every 6 (six) hours as needed for wheezing or shortness of breath. With spacer 18 g 1  . cetirizine (ZYRTEC) 5 MG tablet Take 1 tablet (5 mg total) by mouth daily as needed. 90 tablet 1  . cromolyn (GASTROCROM) 100 MG/5ML solution 100 mg three times daily prior to meals as needed. 600 mL 0  . EPINEPHrine 0.3 mg/0.3 mL IJ SOAJ injection Inject 0.3 mLs (0.3 mg total) into the muscle as needed  for anaphylaxis. 1 Device 2  . famotidine (PEPCID) 40 MG tablet Take 1 tablet (40 mg total) by mouth 2 (two) times daily. (Patient taking differently: Take 40 mg by mouth as needed. ) 60 tablet 5  . Fluocinolone Acetonide Scalp 0.01 % OIL APP TO THE SCALP ONCE D    . fluticasone (FLONASE) 50 MCG/ACT nasal spray Place 1 spray into both nostrils as needed for allergies or rhinitis. 16 g 5  . fluticasone (FLOVENT HFA) 110 MCG/ACT inhaler Inhale 2 puffs into the lungs 2 (two) times daily  as needed. 1 Inhaler 12  . hydrOXYzine (ATARAX/VISTARIL) 10 MG tablet Take 20 mg by mouth as directed.     . loratadine (CLARITIN) 10 MG tablet Take 1 tablet (10 mg total) by mouth daily as needed. 90 tablet 1  . nitrofurantoin, macrocrystal-monohydrate, (MACROBID) 100 MG capsule Take 1 tablet after intercourse    . omeprazole (PRILOSEC) 20 MG capsule Take 1 capsule (20 mg total) by mouth daily. 90 capsule 1  . ondansetron (ZOFRAN) 4 MG tablet Take 4 mg by mouth every 8 (eight) hours as needed for nausea or vomiting.    . Selenium Sulfide 2.3 % SHAM 1 APPLICATION TO SCALP APPLY D PRN FOR DRANDUFF    . Spacer/Aero-Holding Dorise Bullion Use as directed with albuterol hfa inhaler. 1 each 0   No current facility-administered medications for this visit.     Known medication allergies: Allergies  Allergen Reactions  . Diphenhydramine Other (See Comments)    Chest Pain Chest Pain Chest Pain  . Other     Tree nuts   . Peanut-Containing Drug Products Anaphylaxis  . Corn-Containing Products   . Soy Allergy   . Tobramycin Other (See Comments)    Caused dizziness  . Ketoconazole Rash     Physical examination: Blood pressure 106/72, pulse 96, temperature 98 F (36.7 C), temperature source Temporal, resp. rate 16, weight 130 lb (59 kg), SpO2 98 %.  General: Alert, interactive, in no acute distress. HEENT: PERRLA, TMs pearly gray, turbinates moderately edematous with clear discharge, post-pharynx non erythematous. Neck: Supple without lymphadenopathy. Lungs: Clear to auscultation without wheezing, rhonchi or rales. {no increased work of breathing. CV: Normal S1, S2 without murmurs. Abdomen: Nondistended, nontender. Skin: Warm and dry, without lesions or rashes. Extremities:  No clubbing, cyanosis or edema. Neuro:   Grossly intact.  Diagnositics/Labs: None today  Assessment and plan:   Allergic reaction/Food allergy  Allergic rhinitis with OAS GERD Weight loss -  improved Palpitations - secondary to antihistamine use Reactive airway, allergen driven   - Continue medications below:                A.  Dymista 1 spray each nostril twice a day or Flonase (if dymista unavailable) 1-2 sprays each nostril daily for nasal congestion/drainage.  Provided with a saline rinse kit to perform rinses prior to using medicated nasal sprays.  Also recommended using rinse if she is outside to flush out pollens or other inhalants that will get into the nose.  She has been advised to use either distilled water or boiled water and bring down to room temperature for use.               B.  Albuterol inhaler 2 puffs every 4 hours as needed for cough, wheeze or shortness of breath    C.  Flovent 166mg 2 puffs twice a day  D.  Allegra 14m daily as needed and prior to immunotherapy injection               E.  Famotidine 20 mg as needed    F.  Hydroxyzine 10 mg - 1-2 tablets up to 3 times a day as needed for allergic reaction    H.  Have access to your Epipen    I.   Use prednisone 131m-2051ms needed for allergic reaction management     J.  Omeprazole 20-2m56mily for 2-week duration of time  -We have discussed possibility of having mast cell activation syndrome which can be a spectrum of mastocytosis.  With normal tryptase on multiple draws mastocytosis is much less likely however could be on the spectrum with continued allergic reaction symptoms.   Have advised to obtain 24hr urine studies if she develops symptoms to collect and see if she is having elevation of mast cell derivatives during these attacks.   We have also discussed trial of Cromolyn prior to meals (that are not her "safe" foods); will use if having degree of GI symptoms.   -Very pleased with the weight gain she has made!  Also very pleased with the reintroduction of potatoes into her diet - Continue allergen immunotherapy (allergy shots) at maintenance dosing!.  Bring your Epipen on days of your  injections.   Continue antihistamine pre-medication regimen prior to allergy shot at this time.  Once we get back up to maintenance dosing from starting a new vial today we will plan to wean her premedication regimen down to just Allegra prior and see how she tolerates.  - Continue to keep track of reactions and note any triggers.   - has been [facility] for palpitations.  ENT, GI and cardiology evaluations has been reassuring to-date.  -Once we are solidly in our maintenance immunotherapy regimen and she has proven that she is doing better with tree pollen season this year that we can start attempting food challenges in the office again.  I appreciate the opportunity to take part in [name] [name]'s care. Please do not hesitate to contact me with questions.  Sincerely,   ShayPrudy Feeler Allergy/Immunology [facility]

## 2020-02-10 NOTE — Telephone Encounter (Signed)
Pt is coming in this afternoon for an injection. Was here earlier for an appointment.  Pt is aware to pre-medicate.

## 2020-02-11 NOTE — Patient Instructions (Addendum)
Allergic reaction/Food allergy  Allergic rhinitis with OAS GERD Weight loss - improved Palpitations - secondary to antihistamine use Reactive airway, allergen driven   - Continue medications below:                A.  Dymista 1 spray each nostril twice a day or Flonase (if dymista unavailable) 1-2 sprays each nostril daily for nasal congestion/drainage.  Provided with a saline rinse kit to perform rinses prior to using medicated nasal sprays.  Also recommended using rinse if she is outside to flush out pollens or other inhalants that will get into the nose.  She has been advised to use either distilled water or boiled water and bring down to room temperature for use.               B.  Albuterol inhaler 2 puffs every 4 hours as needed for cough, wheeze or shortness of breath    C.  Flovent 165mg 2 puffs twice a day                D.  Allegra 1872mdaily as needed and prior to immunotherapy injection               E.  Famotidine 20 mg as needed    F.  Hydroxyzine 10 mg - 1-2 tablets up to 3 times a day as needed for allergic reaction    H.  Have access to your Epipen    I.   Use prednisone 101m51m27m needed for allergic reaction management     J.  Omeprazole 20-40mg27mly for 2-week duration of time  -We have discussed possibility of having mast cell activation syndrome which can be a spectrum of mastocytosis.  With normal tryptase on multiple draws mastocytosis is much less likely however could be on the spectrum with continued allergic reaction symptoms.   Have advised to obtain 24hr urine studies if she develops symptoms to collect and see if she is having elevation of mast cell derivatives during these attacks.   We have also discussed trial of Cromolyn prior to meals (that are not her "safe" foods); will use if having degree of GI symptoms.   -Very pleased with the weight gain she has made!  Also very pleased with the reintroduction of potatoes into her diet - Continue allergen  immunotherapy (allergy shots) at maintenance dosing!.  Bring your Epipen on days of your injections.   Continue antihistamine pre-medication regimen prior to allergy shot at this time.  Once we get back up to maintenance dosing from starting a new vial today we will plan to wean her premedication regimen down to just Allegra prior and see how she tolerates.  - Continue to keep track of reactions and note any triggers.   - has been [facility], [facility]iology for palpitations.  ENT, GI and cardiology evaluations has been reassuring to-date.  -Once we are solidly in our maintenance immunotherapy regimen and she has proven that she is doing better with tree pollen season this year that we can start attempting food challenges in the office again.

## 2020-02-19 ENCOUNTER — Ambulatory Visit (INDEPENDENT_AMBULATORY_CARE_PROVIDER_SITE_OTHER)

## 2020-02-19 DIAGNOSIS — J309 Allergic rhinitis, unspecified: Secondary | ICD-10-CM | POA: Diagnosis not present

## 2020-02-19 DIAGNOSIS — T7840XD Allergy, unspecified, subsequent encounter: Secondary | ICD-10-CM

## 2020-02-26 ENCOUNTER — Ambulatory Visit (INDEPENDENT_AMBULATORY_CARE_PROVIDER_SITE_OTHER)

## 2020-02-26 DIAGNOSIS — J309 Allergic rhinitis, unspecified: Secondary | ICD-10-CM

## 2020-02-26 DIAGNOSIS — T7840XD Allergy, unspecified, subsequent encounter: Secondary | ICD-10-CM

## 2020-03-02 ENCOUNTER — Ambulatory Visit (INDEPENDENT_AMBULATORY_CARE_PROVIDER_SITE_OTHER)

## 2020-03-02 DIAGNOSIS — J309 Allergic rhinitis, unspecified: Secondary | ICD-10-CM | POA: Diagnosis not present

## 2020-03-18 ENCOUNTER — Ambulatory Visit (INDEPENDENT_AMBULATORY_CARE_PROVIDER_SITE_OTHER)

## 2020-03-18 DIAGNOSIS — J309 Allergic rhinitis, unspecified: Secondary | ICD-10-CM | POA: Diagnosis not present

## 2020-03-25 ENCOUNTER — Ambulatory Visit (INDEPENDENT_AMBULATORY_CARE_PROVIDER_SITE_OTHER)

## 2020-03-25 DIAGNOSIS — J309 Allergic rhinitis, unspecified: Secondary | ICD-10-CM | POA: Diagnosis not present

## 2020-04-01 ENCOUNTER — Ambulatory Visit (INDEPENDENT_AMBULATORY_CARE_PROVIDER_SITE_OTHER): Admitting: *Deleted

## 2020-04-01 DIAGNOSIS — J309 Allergic rhinitis, unspecified: Secondary | ICD-10-CM

## 2020-04-12 ENCOUNTER — Other Ambulatory Visit: Payer: Self-pay | Admitting: *Deleted

## 2020-04-12 MED ORDER — FLOVENT HFA 110 MCG/ACT IN AERO: 2.0000 | INHALATION_SPRAY | Freq: Two times a day (BID) | RESPIRATORY_TRACT | 5 refills | Status: AC | PRN

## 2020-04-12 MED ORDER — FAMOTIDINE 40 MG PO TABS: 40.0000 mg | ORAL_TABLET | Freq: Two times a day (BID) | ORAL | 5 refills | Status: AC

## 2020-04-12 MED ORDER — FLUTICASONE PROPIONATE 50 MCG/ACT NA SUSP: 1.0000 | NASAL | 5 refills | Status: DC | PRN

## 2020-04-13 ENCOUNTER — Other Ambulatory Visit: Payer: Self-pay | Admitting: Family Medicine

## 2020-04-13 DIAGNOSIS — N946 Dysmenorrhea, unspecified: Secondary | ICD-10-CM

## 2020-04-13 DIAGNOSIS — R1011 Right upper quadrant pain: Secondary | ICD-10-CM

## 2020-04-14 ENCOUNTER — Other Ambulatory Visit: Payer: Self-pay

## 2020-04-14 ENCOUNTER — Ambulatory Visit

## 2020-04-14 ENCOUNTER — Ambulatory Visit (INDEPENDENT_AMBULATORY_CARE_PROVIDER_SITE_OTHER)

## 2020-04-14 DIAGNOSIS — N946 Dysmenorrhea, unspecified: Secondary | ICD-10-CM | POA: Diagnosis not present

## 2020-04-14 DIAGNOSIS — R1011 Right upper quadrant pain: Secondary | ICD-10-CM | POA: Diagnosis not present

## 2020-04-28 ENCOUNTER — Ambulatory Visit (INDEPENDENT_AMBULATORY_CARE_PROVIDER_SITE_OTHER): Admitting: Allergy

## 2020-04-28 ENCOUNTER — Other Ambulatory Visit: Payer: Self-pay

## 2020-04-28 ENCOUNTER — Encounter: Payer: Self-pay | Admitting: Allergy

## 2020-04-28 VITALS — BP 110/60 | HR 94 | Resp 18 | Ht 62.0 in

## 2020-04-28 DIAGNOSIS — T7840XD Allergy, unspecified, subsequent encounter: Secondary | ICD-10-CM

## 2020-04-28 DIAGNOSIS — Z7185 Encounter for immunization safety counseling: Secondary | ICD-10-CM

## 2020-04-28 DIAGNOSIS — J4540 Moderate persistent asthma, uncomplicated: Secondary | ICD-10-CM

## 2020-04-28 DIAGNOSIS — T781XXD Other adverse food reactions, not elsewhere classified, subsequent encounter: Secondary | ICD-10-CM

## 2020-04-28 DIAGNOSIS — Z7189 Other specified counseling: Secondary | ICD-10-CM

## 2020-04-28 DIAGNOSIS — T7819XD Other adverse food reactions, not elsewhere classified, subsequent encounter: Secondary | ICD-10-CM

## 2020-04-28 DIAGNOSIS — J3089 Other allergic rhinitis: Secondary | ICD-10-CM

## 2020-04-28 DIAGNOSIS — K2100 Gastro-esophageal reflux disease with esophagitis, without bleeding: Secondary | ICD-10-CM

## 2020-04-28 NOTE — Patient Instructions (Addendum)
Allergic reaction/Food allergy  Allergic rhinitis with OAS GERD Reactive airway, allergen driven Covid vaccine counseling   - Continue medications below:                A.  Dymista 1 spray each nostril twice a day or Flonase (if dymista unavailable) 1-2 sprays each nostril daily for nasal congestion/drainage.  Provided with a saline rinse kit to perform rinses prior to using medicated nasal sprays.  Also recommended using rinse if she is outside to flush out pollens or other inhalants that will get into the nose.  She has been advised to use either distilled water or boiled water and bring down to room temperature for use.               B.  Albuterol inhaler 2 puffs every 4 hours as needed for cough, wheeze or shortness of breath    C.  Flovent 168mg 2 puffs twice a day                D.  Allegra '180mg'$  daily as needed and prior to immunotherapy injection               E.  Famotidine 20 mg as needed    F.  Hydroxyzine 10 mg - 1-2 tablets up to 3 times a day as needed for allergic reaction    H.  Have access to your Epipen    I.   Use prednisone '10mg'$  -'20mg'$  as needed for allergic reaction management     J.  Omeprazole 20-'40mg'$  daily for 2-week duration of time  -We have discussed possibility of having mast cell activation syndrome which can be a spectrum of mastocytosis.  With normal tryptase on multiple draws mastocytosis is much less likely however could be on the spectrum with continued allergic reaction symptoms.   Have advised to obtain 24hr urine studies if she develops symptoms to collect and see if she is having elevation of mast cell derivatives during these attacks.   We have also discussed trial of Cromolyn prior to meals (that are not her "safe" foods); will use if having degree of GI symptoms.   - Continue allergen immunotherapy (allergy shots) at maintenance dosing!.  Bring your Epipen on days of your injections.   Continue antihistamine pre-medication regimen prior to allergy shot at  this time (trying to decrease what is needed -- decreased hydroxyzine '10mg'$ ).  Once back at maintenance will go to every other week injections. - Continue to keep track of reactions and note any triggers.   - has been [facility] for palpitations.  ENT, GI and cardiology evaluations has been reassuring to-date.  -Once she has made it back to maintenance immunotherapy regimen from new vial start will start doing food challenges in the office again.   This will be outside of tree pollen season.   - discussed Covid vaccine with her today.  She does not have any reactions to vaccines in the past.  People with common allergies to medications, foods, aeroallergens, venom and latex are probably no more likely than the general public to have an allergic reaction to the mRNA COVID-19  (MMisquamicutand Pfizer) or J&J vaccines.  Thus, I recommend that she receive the COVID vaccine.  Clotting with J&J is still a very rare event and thus should not preclude her from getting this vaccine.  She will have access to her epinephrine device and will go with another person and will  wait for a 30 minute observation time.

## 2020-04-28 NOTE — Progress Notes (Signed)
Follow-up Note  RE: [name] [name] MRN: [id number] DOB: 19-Feb-1986 Date of Office Visit: 04/28/2020   History of present illness: [name] [name] is a 34 y.o. female presenting today for follow-up of allergic reaction with food pollen allergy syndrome, allergic rhinitis, GERd, reactive airway.  She was last seen in the office on 02/10/20.  She is wanting to inquire about getting the covid vaccine.  She would prefer to get the J&J vaccine due to one time dosing.  She has been having issues gyn issues related to her cycle and had a recent transvaginal US that showed she has pelvic congestion syndrome.  She is concerned possibly if this is clot risk and then the rare issues of clots with the J&J vaccine.  She will be following up with gyn regarding the pelvic congestion syndrome.  She has not had any previous reactions to vaccines in past.  Has not had colonoscopy and does not believe has had miralax.   She states about 2 weeks ago her husband and son both tested positive for Covid and were symptomatic. They have been able to quarantine in home.  She was also tested and was negative.  Due to this she has not been able to come in for her allergy shot in about 3 weeks.  The last allergy shot she tried to decrease her premedication regimen and did go down to hydroxyzine 37m.  She states the shot went fine however the next several days some foods she states felt like made her mouth tingle.   She is interested and ready to start doing some food challenges especially to rice.    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   Review of systems: Review of Systems  Constitutional: Negative.   HENT: Positive for congestion.   Eyes: Negative.   Respiratory: Negative.   Cardiovascular: Negative.   Gastrointestinal: Negative.   Musculoskeletal: Negative.   Skin: Negative.   Neurological: Negative.     All other systems negative unless noted above in HPI  Past medical/social/surgical/family  history have been reviewed and are unchanged unless specifically indicated below.  No changes  Medication List: Current Outpatient Medications  Medication Sig Dispense Refill  . albuterol (PROAIR HFA) 108 (90 Base) MCG/ACT inhaler Inhale 2 puffs into the lungs every 6 (six) hours as needed for wheezing or shortness of breath. With spacer 18 g 1  . cetirizine (ZYRTEC) 5 MG tablet Take 1 tablet (5 mg total) by mouth daily as needed. 90 tablet 1  . cromolyn (GASTROCROM) 100 MG/5ML solution 100 mg three times daily prior to meals as needed. 600 mL 0  . EPINEPHrine 0.3 mg/0.3 mL IJ SOAJ injection Inject 0.3 mLs (0.3 mg total) into the muscle as needed for anaphylaxis. 1 Device 2  . famotidine (PEPCID) 40 MG tablet Take 1 tablet (40 mg total) by mouth 2 (two) times daily. 60 tablet 5  . Fluocinolone Acetonide Scalp 0.01 % OIL APP TO THE SCALP ONCE D    . fluticasone (FLONASE) 50 MCG/ACT nasal spray Place 1 spray into both nostrils as needed for allergies or rhinitis. 16 g 5  . fluticasone (FLOVENT HFA) 110 MCG/ACT inhaler Inhale 2 puffs into the lungs 2 (two) times daily as needed. 1 Inhaler 5  . hydrOXYzine (ATARAX/VISTARIL) 10 MG tablet Take 20 mg by mouth as directed.     . loratadine (CLARITIN) 10 MG tablet Take 1 tablet (10 mg total) by mouth daily as needed. 90 tablet 1  .  nitrofurantoin, macrocrystal-monohydrate, (MACROBID) 100 MG capsule Take 1 tablet after intercourse    . omeprazole (PRILOSEC) 20 MG capsule Take 1 capsule (20 mg total) by mouth daily. 90 capsule 1  . ondansetron (ZOFRAN) 4 MG tablet Take 4 mg by mouth every 8 (eight) hours as needed for nausea or vomiting.    . Selenium Sulfide 2.3 % SHAM 1 APPLICATION TO SCALP APPLY D PRN FOR DRANDUFF    . Spacer/Aero-Holding [name] [name] Use as directed with albuterol hfa inhaler. 1 each 0   No current facility-administered medications for this visit.     Known medication allergies: Allergies  Allergen Reactions  . Diphenhydramine  Other (See Comments)    Chest Pain Chest Pain Chest Pain  . Other     Tree nuts   . Peanut-Containing Drug Products Anaphylaxis  . Corn-Containing Products   . Soy Allergy   . Tobramycin Other (See Comments)    Caused dizziness  . Ketoconazole Rash     Physical examination: Blood pressure 110/60, pulse 94, resp. rate 18, height '5\' 2"'  (1.575 m), SpO2 98 %.  General: Alert, interactive, in no acute distress. HEENT: TMs pearly gray, turbinates moderately edematous with clear discharge, post-pharynx non erythematous. Neck: Supple without lymphadenopathy. Lungs: Clear to auscultation without wheezing, rhonchi or rales. {no increased work of breathing. CV: Normal S1, S2 without murmurs. Abdomen: Nondistended, nontender. Skin: Warm and dry, without lesions or rashes. Extremities:  No clubbing, cyanosis or edema. Neuro:   Grossly intact.  Diagnositics/Labs: None today  Assessment and plan:   Allergic reaction/Food allergy  Allergic rhinitis with OAS GERD Reactive airway, allergen driven Covid vaccine counseling   - Continue medications below:                A.  Dymista 1 spray each nostril twice a day or Flonase (if dymista unavailable) 1-2 sprays each nostril daily for nasal congestion/drainage.  Provided with a saline rinse kit to perform rinses prior to using medicated nasal sprays.  Also recommended using rinse if she is outside to flush out pollens or other inhalants that will get into the nose.  She has been advised to use either distilled water or boiled water and bring down to room temperature for use.               B.  Albuterol inhaler 2 puffs every 4 hours as needed for cough, wheeze or shortness of breath    C.  Flovent 15mg 2 puffs twice a day                D.  Allegra 1878mdaily as needed and prior to immunotherapy injection               E.  Famotidine 20 mg as needed    F.  Hydroxyzine 10 mg - 1-2 tablets up to 3 times a day as needed for allergic  reaction    H.  Have access to your Epipen    I.   Use prednisone 1047m65m30m needed for allergic reaction management     J.  Omeprazole 20-40mg41mly for 2-week duration of time  -We have discussed possibility of having mast cell activation syndrome which can be a spectrum of mastocytosis.  With normal tryptase on multiple draws mastocytosis is much less likely however could be on the spectrum with continued allergic reaction symptoms.   Have advised to obtain 24hr urine studies if she develops symptoms to collect and see if she  is having elevation of mast cell derivatives during these attacks.   We have also discussed trial of Cromolyn prior to meals (that are not her "safe" foods); will use if having degree of GI symptoms.   - Continue allergen immunotherapy (allergy shots) at maintenance dosing!.  Bring your Epipen on days of your injections.   Continue antihistamine pre-medication regimen prior to allergy shot at this time (trying to decrease what is needed -- decreased hydroxyzine 31m).  Once back at maintenance will go to every other week injections. - Continue to keep track of reactions and note any triggers.   - has [facility] palpitations.  ENT, GI and cardiology evaluations has been reassuring to-date.  -Once she has made it back to maintenance immunotherapy regimen from new vial start will start doing food challenges in the office again.   This will be outside of tree pollen season.   - discussed Covid vaccine with her today.  She does not have any reactions to vaccines in the past.  People with common allergies to medications, foods, aeroallergens, venom and latex are probably no more likely than the general public to have an allergic reaction to the mRNA COVID-19  (MSeboyetaand Pfizer) or J&J vaccines.  Thus, I recommend that she receive the COVID vaccine.  Clotting with J&J is still a very rare event and thus should not preclude  her from getting this vaccine.  She will have access to her epinephrine device and will go with another person and will wait for a 30 minute observation time.     F/u 4-6 months or sooner if needed  I appreciate the opportunity to take part in [name] [name]'s care. Please do not hesitate to contact me with questions.  Sincerely,   [name] Feeler MD Allergy/Immunology [facility]

## 2020-04-29 ENCOUNTER — Ambulatory Visit (INDEPENDENT_AMBULATORY_CARE_PROVIDER_SITE_OTHER): Admitting: *Deleted

## 2020-04-29 DIAGNOSIS — J309 Allergic rhinitis, unspecified: Secondary | ICD-10-CM

## 2020-05-05 ENCOUNTER — Ambulatory Visit (INDEPENDENT_AMBULATORY_CARE_PROVIDER_SITE_OTHER)

## 2020-05-05 DIAGNOSIS — J309 Allergic rhinitis, unspecified: Secondary | ICD-10-CM | POA: Diagnosis not present

## 2020-05-10 ENCOUNTER — Ambulatory Visit (INDEPENDENT_AMBULATORY_CARE_PROVIDER_SITE_OTHER)

## 2020-05-10 DIAGNOSIS — J309 Allergic rhinitis, unspecified: Secondary | ICD-10-CM

## 2020-05-17 ENCOUNTER — Ambulatory Visit (INDEPENDENT_AMBULATORY_CARE_PROVIDER_SITE_OTHER)

## 2020-05-17 DIAGNOSIS — J309 Allergic rhinitis, unspecified: Secondary | ICD-10-CM

## 2020-05-18 DIAGNOSIS — J301 Allergic rhinitis due to pollen: Secondary | ICD-10-CM | POA: Diagnosis not present

## 2020-05-18 NOTE — Progress Notes (Signed)
VIALS EXP 05-18-21 

## 2020-05-19 DIAGNOSIS — J3089 Other allergic rhinitis: Secondary | ICD-10-CM | POA: Diagnosis not present

## 2020-05-27 ENCOUNTER — Ambulatory Visit (INDEPENDENT_AMBULATORY_CARE_PROVIDER_SITE_OTHER)

## 2020-05-27 DIAGNOSIS — J309 Allergic rhinitis, unspecified: Secondary | ICD-10-CM | POA: Diagnosis not present

## 2020-06-02 ENCOUNTER — Ambulatory Visit (INDEPENDENT_AMBULATORY_CARE_PROVIDER_SITE_OTHER)

## 2020-06-02 DIAGNOSIS — J309 Allergic rhinitis, unspecified: Secondary | ICD-10-CM | POA: Diagnosis not present

## 2020-06-09 ENCOUNTER — Ambulatory Visit (INDEPENDENT_AMBULATORY_CARE_PROVIDER_SITE_OTHER)

## 2020-06-09 ENCOUNTER — Other Ambulatory Visit: Payer: Self-pay | Admitting: Allergy

## 2020-06-09 DIAGNOSIS — J309 Allergic rhinitis, unspecified: Secondary | ICD-10-CM

## 2020-06-15 ENCOUNTER — Other Ambulatory Visit: Payer: Self-pay

## 2020-06-15 ENCOUNTER — Emergency Department (HOSPITAL_COMMUNITY)
Admission: EM | Admit: 2020-06-15 | Discharge: 2020-06-15 | Disposition: A | Discharge status: Home / Self Care | Attending: Emergency Medicine | Admitting: Emergency Medicine

## 2020-06-15 ENCOUNTER — Encounter (HOSPITAL_COMMUNITY): Payer: Self-pay

## 2020-06-15 DIAGNOSIS — R42 Dizziness and giddiness: Secondary | ICD-10-CM | POA: Insufficient documentation

## 2020-06-15 DIAGNOSIS — Z91010 Allergy to peanuts: Secondary | ICD-10-CM | POA: Diagnosis not present

## 2020-06-15 DIAGNOSIS — R55 Syncope and collapse: Secondary | ICD-10-CM | POA: Insufficient documentation

## 2020-06-15 DIAGNOSIS — R110 Nausea: Secondary | ICD-10-CM | POA: Insufficient documentation

## 2020-06-15 DIAGNOSIS — R102 Pelvic and perineal pain: Secondary | ICD-10-CM | POA: Insufficient documentation

## 2020-06-15 DIAGNOSIS — J45909 Unspecified asthma, uncomplicated: Secondary | ICD-10-CM | POA: Insufficient documentation

## 2020-06-15 LAB — CBC
HCT: 35.5 % — ABNORMAL LOW (ref 36.0–46.0)
Hemoglobin: 11.6 g/dL — ABNORMAL LOW (ref 12.0–15.0)
MCH: 29.7 pg (ref 26.0–34.0)
MCHC: 32.7 g/dL (ref 30.0–36.0)
MCV: 90.8 fL (ref 80.0–100.0)
Platelets: 238 10*3/uL (ref 150–400)
RBC: 3.91 MIL/uL (ref 3.87–5.11)
RDW: 14.4 % (ref 11.5–15.5)
WBC: 6.6 10*3/uL (ref 4.0–10.5)
nRBC: 0 % (ref 0.0–0.2)

## 2020-06-15 LAB — I-STAT BETA HCG BLOOD, ED (MC, WL, AP ONLY): I-stat hCG, quantitative: <5 m[IU]/mL (ref <5)

## 2020-06-15 LAB — BASIC METABOLIC PANEL
Anion gap: 8 (ref 5–15)
BUN: 13 mg/dL (ref 6–20)
CO2: 21 mmol/L — ABNORMAL LOW (ref 22–32)
Calcium: 8 mg/dL — ABNORMAL LOW (ref 8.9–10.3)
Chloride: 108 mmol/L (ref 98–111)
Creatinine, Ser: 0.92 mg/dL (ref 0.44–1.00)
GFR calc Af Amer: >60 mL/min (ref >60)
GFR calc non Af Amer: >60 mL/min (ref >60)
Glucose, Bld: 104 mg/dL — ABNORMAL HIGH (ref 70–99)
Potassium: 3.7 mmol/L (ref 3.5–5.1)
Sodium: 137 mmol/L (ref 135–145)

## 2020-06-15 LAB — CBG MONITORING, ED: Glucose-Capillary: 100 mg/dL — ABNORMAL HIGH (ref 70–99)

## 2020-06-15 MED ORDER — SODIUM CHLORIDE 0.9% FLUSH
3.0000 mL | Freq: Once | INTRAVENOUS | Status: AC
  Administered 2020-06-15: 3 mL via INTRAVENOUS

## 2020-06-15 MED ORDER — SODIUM CHLORIDE 0.9 % IV BOLUS
1000.0000 mL | Freq: Once | INTRAVENOUS | Status: AC
  Administered 2020-06-15: 1000 mL via INTRAVENOUS

## 2020-06-15 NOTE — ED Provider Notes (Signed)
[facility]-EMERGENCY DEPT Provider Note   CSN: [id number] Arrival date & time: 06/15/20  0127     History Chief Complaint  Patient presents with  . Near Syncope    [name] [name] is a 34 y.o. female.  HPI   34 year old female with a history of ADHD, angioedema, asthma, PVCs, UTI, who presents to the emergency department today for evaluation of feeling of near syncope.  Patient reports intermittent lightheadedness mostly noted with positional changes.  She also has also been feeling nauseated for the last 2 days and states that she is feeling sick to her stomach like she ate something she was not supposed to eat.  Notes that she has many food allergies and has not eaten as much due to this.  She has been trying to drink lots of fluids.  She denies any vomiting, diarrhea, constipation.  Denies any urinary symptoms.  States she does have some pelvic cramping as she is on her menstrual cycle. Had a temporary episode of shortness of breath upon waking that has since resolved. Denies any current chest pain, sob, or pleuritic pain. Denies leg pain/swelling, hemoptysis, recent surgery/trauma, recent long travel, hormone use, personal hx of cancer, or hx of DVT/PE.   Was orthostatic with ems PTA and received fluid bolus with them.    Past Medical History:  Diagnosis Date  . ADHD   . Angio-edema   . Asthma   . PVC (premature ventricular contraction)   . UTI (urinary tract infection)    hx of freq in 2018    Patient Active Problem List   Diagnosis Date Noted  . Generalized headaches 07/06/2019  . Imbalance 07/06/2019  . History of recurrent UTIs 02/19/2019  . Globus pharyngeus 01/15/2019  . Asthma, well controlled 01/13/2019  . Allergic reaction 01/12/2019  . Adverse food reaction 01/12/2019  . Pollen-food allergy 01/12/2019  . Other allergic rhinitis 01/12/2019  . Seasonal allergies 05/14/2018    Past Surgical History:  Procedure Laterality Date  .  ESOPHAGOGASTRODUODENOSCOPY    . LEEP  2010  . WISDOM TOOTH EXTRACTION  2008     OB History   No obstetric history on file.     Family History  Problem Relation Age of Onset  . Diabetes Mother   . Hyperthyroidism Mother   . Diabetes Father        type 2  . Heart disease Father   . Cancer Father        prostate  . Eczema Sister   . Diabetes Maternal Grandmother        type 2  . Allergic rhinitis Neg Hx   . Asthma Neg Hx   . Urticaria Neg Hx   . Colon cancer Neg Hx   . Esophageal cancer Neg Hx   . Stomach cancer Neg Hx   . Rectal cancer Neg Hx     Social History   Tobacco Use  . Smoking status: Never Smoker  . Smokeless tobacco: Never Used  Vaping Use  . Vaping Use: Never used  Substance Use Topics  . Alcohol use: Yes    Comment: 1-2 glasses wine/weekly  . Drug use: No    Home Medications Prior to Admission medications   Medication Sig Start Date End Date Taking? Authorizing Provider  famotidine (PEPCID) 40 MG tablet Take 1 tablet (40 mg total) by mouth 2 (two) times daily. 04/12/20  Yes Padgett, Pilar Grammes, MD  fexofenadine (ALLEGRA) 180 MG tablet Take 180 mg by mouth daily.  Yes [provider]  Fluocinolone Acetonide Scalp 0.01 % OIL Apply 1 application topically daily as needed (itching).  02/20/19  Yes [provider]  fluticasone (FLONASE) 50 MCG/ACT nasal spray Place 1 spray into both nostrils as needed for allergies or rhinitis. 04/12/20  Yes Padgett, Pilar GrammesShaylar Patricia, MD  fluticasone (FLOVENT HFA) 110 MCG/ACT inhaler Inhale 2 puffs into the lungs 2 (two) times daily as needed. Patient taking differently: Inhale 2 puffs into the lungs 2 (two) times daily as needed (wheezing).  04/12/20  Yes Padgett, Pilar GrammesShaylar Patricia, MD  hydrOXYzine (ATARAX/VISTARIL) 10 MG tablet Take 1-2 tablets three times a day as needed. Patient taking differently: Take 10-20 mg by mouth 3 (three) times daily as needed for itching or anxiety.  06/09/20  Yes Padgett,  Pilar GrammesShaylar Patricia, MD  omeprazole (PRILOSEC) 20 MG capsule Take 1 capsule (20 mg total) by mouth daily. Patient taking differently: Take 20 mg by mouth daily as needed (heart burn).  10/15/19  Yes Padgett, Pilar GrammesShaylar Patricia, MD  Selenium Sulfide 2.3 % SHAM Apply 1 application topically daily as needed. For dandruff 02/23/19  Yes [provider]  albuterol (PROAIR HFA) 108 (90 Base) MCG/ACT inhaler Inhale 2 puffs into the lungs every 6 (six) hours as needed for wheezing or shortness of breath. With spacer 03/11/19   Marcelyn BruinsPadgett, Shaylar Patricia, MD  cetirizine (ZYRTEC) 5 MG tablet Take 1 tablet (5 mg total) by mouth daily as needed. Patient not taking: Reported on 06/15/2020 08/11/19   Marcelyn BruinsPadgett, Shaylar Patricia, MD  cromolyn (GASTROCROM) 100 MG/5ML solution 100 mg three times daily prior to meals as needed. Patient not taking: Reported on 06/15/2020 10/15/19   Marcelyn BruinsPadgett, Shaylar Patricia, MD  EPINEPHrine 0.3 mg/0.3 mL IJ SOAJ injection Inject 0.3 mLs (0.3 mg total) into the muscle as needed for anaphylaxis. 01/09/19   Marcelyn BruinsPadgett, Shaylar Patricia, MD  loratadine (CLARITIN) 10 MG tablet Take 1 tablet (10 mg total) by mouth daily as needed. Patient not taking: Reported on 06/15/2020 10/15/19   Marcelyn BruinsPadgett, Shaylar Patricia, MD  nitrofurantoin, macrocrystal-monohydrate, (MACROBID) 100 MG capsule Take 100 mg by mouth See admin instructions. 1 tablet after intercourse 02/19/19   [provider]  Spacer/Aero-Holding Rudean Curthambers DEVI Use as directed with albuterol hfa inhaler. 03/11/19   Marcelyn BruinsPadgett, Shaylar Patricia, MD    Allergies    Diphenhydramine, Other, Peanut-containing drug products, Corn-containing products, Soy allergy, Tobramycin, and Ketoconazole  Review of Systems   Review of Systems  Constitutional: Negative for fever.  HENT: Negative for ear pain and sore throat.   Eyes: Negative for visual disturbance.  Respiratory: Negative for cough and shortness of breath.   Cardiovascular: Negative for chest pain  and leg swelling.  Gastrointestinal: Positive for nausea. Negative for abdominal pain, constipation, diarrhea and vomiting.  Genitourinary: Positive for vaginal bleeding (on menses). Negative for dysuria and hematuria.  Musculoskeletal: Negative for back pain.  Skin: Negative for rash.  Neurological: Positive for light-headedness. Negative for dizziness, weakness, numbness and headaches.  All other systems reviewed and are negative.   Physical Exam Updated Vital Signs BP 113/80 (BP Location: Right Arm)   Pulse 97   Temp 98 F (36.7 C) (Oral)   Resp (!) 22   Ht 5\' 2"  (1.575 m)   Wt 61.2 kg   LMP 06/15/2020   SpO2 98%   BMI 24.69 kg/m   Physical Exam Vitals and nursing note reviewed.  Constitutional:      General: She is not in acute distress.    Appearance: She is  well-developed.  HENT:     Head: Normocephalic and atraumatic.     Mouth/Throat:     Mouth: Mucous membranes are dry.  Eyes:     Conjunctiva/sclera: Conjunctivae normal.  Cardiovascular:     Rate and Rhythm: Normal rate and regular rhythm.     Pulses: Normal pulses.     Heart sounds: Normal heart sounds. No murmur heard.   Pulmonary:     Effort: Pulmonary effort is normal. No respiratory distress.     Breath sounds: Normal breath sounds. No wheezing, rhonchi or rales.  Abdominal:     General: Bowel sounds are normal.     Palpations: Abdomen is soft.     Tenderness: There is no abdominal tenderness. There is no guarding or rebound.  Musculoskeletal:        General: No tenderness.     Cervical back: Neck supple.     Right lower leg: No edema.     Left lower leg: No edema.  Skin:    General: Skin is warm and dry.  Neurological:     Mental Status: She is alert.     Comments: Clear speech, moving all extremities, ambulating without difficulty     ED Results / Procedures / Treatments   Labs (all labs ordered are listed, but only abnormal results are displayed) Labs Reviewed  BASIC METABOLIC PANEL -  Abnormal; Notable for the following components:      Result Value   CO2 21 (*)    Glucose, Bld 104 (*)    Calcium 8.0 (*)    All other components within normal limits  CBC - Abnormal; Notable for the following components:   Hemoglobin 11.6 (*)    HCT 35.5 (*)    All other components within normal limits  CBG MONITORING, ED - Abnormal; Notable for the following components:   Glucose-Capillary 100 (*)    All other components within normal limits  I-STAT BETA HCG BLOOD, ED ([facility], [facility], [facility] ONLY)    EKG EKG Interpretation  Date/Time:  Wednesday June 15 2020 01:43:26 EDT Ventricular Rate:  109 PR Interval:    QRS Duration: 72 QT Interval:  344 QTC Calculation: 464 R Axis:   68 Text Interpretation: Sinus tachycardia Confirmed by Palumbo, April ([id number]) on 06/15/2020 5:36:26 AM   Radiology No results found.  Procedures Procedures (including critical care time)  Medications Ordered in ED Medications  sodium chloride flush (NS) 0.9 % injection 3 mL (3 mLs Intravenous Given 06/15/20 0159)  sodium chloride 0.9 % bolus 1,000 mL (0 mLs Intravenous Stopped 06/15/20 0459)    ED Course  I have reviewed the triage vital signs and the nursing notes.  Pertinent labs & imaging results that were available during my care of the patient were reviewed by me and considered in my medical decision making (see chart for details).    MDM Rules/Calculators/A&P                          34 year old female presenting for evaluation of nausea and lightheadedness.  Was orthostatic with EMS prior to arrival and received small fluid bolus.  On my evaluation, mucous membranes appear to be dry.  He is moving all extremities with normal coordination and clear speech with no obvious focal nodes.  Abdomen is soft and nontender without peritoneal signs.  Lungs are clear to auscultation bilaterally.  She did have some tachycardia initially which improved after she was given fluid bolus.  She  did not have any hypotension  on orthostatic vital signs but did have tachycardia.  Reviewed/interpreted labs which showed mild anemia, slightly low bicarb of 21, otherwise no significant electrolyte derangement.  Beta-hCG negative.  Attempted to obtain UA however specimen was labeled properly and patient was unable to produce second sample.  She states she is not having symptoms of a UTI and thinks that her lower abdominal cramping is related to her menstrual cycle.  She will prefer to follow-up with her PCP in regards to getting a UA if she becomes symptomatic.  She was given a bolus of IV fluids in the ED and on reassessment she states she is feeling somewhat improved and is no longer lightheaded with movement.  She was able to ambulate to the restroom without difficulty.  I think that her initial tachycardia was likely related to dehydration rather than other emergent cause especially given her reports of decreased p.o. intake recently..  Low risk Wells, doubt PE.  Advised that she push fluids and follow-up closely with her PCP.  Advised on specific return precautions.  She voices understanding of the plan and reasons to return. All Questions answered.  Patient stable for discharge.   Final Clinical Impression(s) / ED Diagnoses Final diagnoses:  Near syncope    Rx / DC Orders ED Discharge Orders    None       Rayne Du 06/15/20 0606    Palumbo, April, MD 06/15/20 [id number]

## 2020-06-15 NOTE — Discharge Instructions (Addendum)
Please continue to push fluids at home.   Please follow up with your regular doctor within the next 3-5 days for reassessment.   Please return to the emergency department for any new or worsening symptoms.

## 2020-06-15 NOTE — ED Notes (Signed)
Patient ambulated to the restroom with no difficulty   ° °

## 2020-06-15 NOTE — ED Triage Notes (Signed)
Patient arrived stating she woke up this morning and felt like she was going to pass out. No syncopal episode but has had nausea for two days. Positive orthostatics in the field with ems

## 2020-06-16 ENCOUNTER — Encounter: Payer: Self-pay | Admitting: Allergy

## 2020-06-16 ENCOUNTER — Ambulatory Visit (INDEPENDENT_AMBULATORY_CARE_PROVIDER_SITE_OTHER): Admitting: Allergy

## 2020-06-16 VITALS — BP 116/80 | HR 91 | Temp 98.0°F | Resp 16

## 2020-06-16 DIAGNOSIS — Z7189 Other specified counseling: Secondary | ICD-10-CM

## 2020-06-16 DIAGNOSIS — T7840XD Allergy, unspecified, subsequent encounter: Secondary | ICD-10-CM | POA: Diagnosis not present

## 2020-06-16 DIAGNOSIS — K2100 Gastro-esophageal reflux disease with esophagitis, without bleeding: Secondary | ICD-10-CM

## 2020-06-16 DIAGNOSIS — J3089 Other allergic rhinitis: Secondary | ICD-10-CM

## 2020-06-16 DIAGNOSIS — J4540 Moderate persistent asthma, uncomplicated: Secondary | ICD-10-CM | POA: Diagnosis not present

## 2020-06-16 DIAGNOSIS — Z7185 Encounter for immunization safety counseling: Secondary | ICD-10-CM

## 2020-06-16 DIAGNOSIS — T781XXD Other adverse food reactions, not elsewhere classified, subsequent encounter: Secondary | ICD-10-CM

## 2020-06-16 MED ORDER — CROMOLYN SODIUM 100 MG/5ML PO CONC: ORAL | 3 refills | Status: DC

## 2020-06-16 NOTE — Progress Notes (Signed)
Follow-up Note  RE: [name] [name] MRN: [id number] DOB: [birth year] Date of Office Visit: 06/16/2020   History of present illness: [name] [name] is a 34 y.o. female presenting today for follow-up.  She was last seen in the office on 04/28/2020 by myself.  She states over the past 4 to 6 weeks approximately she has had several episodes of her reactions that she states have been a bit different than previous reaction she has had.  One of the reactions occur after eating a TicTac where she states she developed chest pain at a 6 out of 10 pain score and developed sensation of lightheadedness and dizziness as well as feeling flushed and nauseated.  She took Allegra and Pepcid and the chest pain did not improve.  She states she normally sucks on the TicTac and does not chew and swallow it however this time she chewed and swallowed it.  Another episode occurred after using a new face cream in the proactive line.  She states after using it her face and mouth felt very dry she had similar symptoms as of.  She did call EMS per the ED record she was orthostatic with EMS and she received a bolus on route.  In the ED she was noted to have a BP of 113/80 with normal vital signs.  On exam her mucous membranes were noted to be dry.  EKG shows sinus tachycardia.  She was given IV fluids.  Beta hCG was negative.  Her lab work shows slight anemia with a slightly low bicarb otherwise no significant electrolyte abnormalities.  After IV fluids she was discharged. She states she has continued to felt very fatigued since this happened earlier this week.  She also reports another reaction happened when she was trying to make go from regular flour.  She previously was using buckwheat flour and other alternative flowers. Notably she states that the family member that was staying with her that was helping her with her meal planning did leave recently.  In this week her family is out of town and she has been by herself primarily. In  regards to her allergy immunotherapy she is at maintenance but is at weekly dosing right now due to starting of a new vial.  She does have some large local reactions following but they do subside.  Upon looking at the ingredients of both the TicTac and the face wash they both contain gums (xanthum and Arabic) as a consistent ingredient between the 2.  She reports she did discuss receiving the [employer] vaccine with her OB/GYN who recommended she not get this particular vaccine due to its potential increasing clot risk.  She continues to think about receiving one of the mRNA based Covid vaccines.  Review of systems: Review of Systems  Constitutional: Positive for malaise/fatigue.  HENT: Positive for congestion.   Eyes: Negative.   Respiratory: Negative.   Cardiovascular: Positive for chest pain.  Gastrointestinal: Positive for nausea. Negative for vomiting.  Genitourinary: Negative.   Musculoskeletal: Negative.   Skin: Negative.   Neurological: Positive for dizziness. Negative for loss of consciousness.    All other systems negative unless noted above in HPI  Past medical/social/surgical/family history have been reviewed and are unchanged unless specifically indicated below.  No changes  Medication List: Current Outpatient Medications  Medication Sig Dispense Refill  . albuterol (PROAIR HFA) 108 (90 Base) MCG/ACT inhaler Inhale 2 puffs into the lungs every 6 (six) hours as needed for wheezing or shortness of breath.  With spacer 18 g 1  . cetirizine (ZYRTEC) 5 MG tablet Take 1 tablet (5 mg total) by mouth daily as needed. 90 tablet 1  . EPINEPHrine 0.3 mg/0.3 mL IJ SOAJ injection Inject 0.3 mLs (0.3 mg total) into the muscle as needed for anaphylaxis. 1 Device 2  . famotidine (PEPCID) 40 MG tablet Take 1 tablet (40 mg total) by mouth 2 (two) times daily. 60 tablet 5  . fexofenadine (ALLEGRA) 180 MG tablet Take 180 mg by mouth daily.    . Fluocinolone Acetonide Scalp 0.01 % OIL  Apply 1 application topically daily as needed (itching).     . fluticasone (FLONASE) 50 MCG/ACT nasal spray Place 1 spray into both nostrils as needed for allergies or rhinitis. 16 g 5  . fluticasone (FLOVENT HFA) 110 MCG/ACT inhaler Inhale 2 puffs into the lungs 2 (two) times daily as needed. (Patient taking differently: Inhale 2 puffs into the lungs 2 (two) times daily as needed (wheezing). ) 1 Inhaler 5  . hydrOXYzine (ATARAX/VISTARIL) 10 MG tablet Take 1-2 tablets three times a day as needed. (Patient taking differently: Take 10-20 mg by mouth 3 (three) times daily as needed for itching or anxiety. ) 90 tablet 1  . omeprazole (PRILOSEC) 20 MG capsule Take 1 capsule (20 mg total) by mouth daily. (Patient taking differently: Take 20 mg by mouth daily as needed (heart burn). ) 90 capsule 1  . Selenium Sulfide 2.3 % SHAM Apply 1 application topically daily as needed. For dandruff    . Spacer/Aero-Holding Dorise Bullion Use as directed with albuterol hfa inhaler. 1 each 0  . cromolyn (GASTROCROM) 100 MG/5ML solution Take 5 ml  three times daily prior to meals 600 mL 3  . loratadine (CLARITIN) 10 MG tablet Take 1 tablet (10 mg total) by mouth daily as needed. (Patient not taking: Reported on 06/15/2020) 90 tablet 1  . nitrofurantoin, macrocrystal-monohydrate, (MACROBID) 100 MG capsule Take 100 mg by mouth See admin instructions. 1 tablet after intercourse (Patient not taking: Reported on 06/16/2020)     No current facility-administered medications for this visit.     Known medication allergies: Allergies  Allergen Reactions  . Diphenhydramine Other (See Comments)    Chest Pain Chest Pain Chest Pain  . Other     Tree nuts   . Peanut-Containing Drug Products Anaphylaxis  . Corn-Containing Products   . Soy Allergy   . Tobramycin Other (See Comments)    Caused dizziness  . Ketoconazole Rash     Physical examination: Blood pressure 116/80, pulse 91, temperature 98 F (36.7 C), resp. rate 16,  last menstrual period 06/15/2020, SpO2 99 %.  General: Alert, interactive, in no acute distress. HEENT: TMs pearly gray, turbinates moderately edematous with clear discharge, post-pharynx non erythematous. Neck: Supple without lymphadenopathy. Lungs: Clear to auscultation without wheezing, rhonchi or rales. {no increased work of breathing. CV: Normal S1, S2 without murmurs. Abdomen: Nondistended, nontender. Skin: Warm and dry, without lesions or rashes. Extremities:  No clubbing, cyanosis or edema. Neuro:   Grossly intact.  Diagnositics/Labs: None today   Assessment and plan:   Allergic reaction/Food allergy  Allergic rhinitis with OAS GERD Reactive airway, allergen driven Covid vaccine counseling   - Continue medications below:                A.  Dymista 1 spray each nostril twice a day or Flonase (if dymista unavailable) 1-2 sprays each nostril daily for nasal congestion/drainage.  Provided with a saline rinse kit to  perform rinses prior to using medicated nasal sprays.  Also recommended using rinse if she is outside to flush out pollens or other inhalants that will get into the nose.  She has been advised to use either distilled water or boiled water and bring down to room temperature for use.               B.  Albuterol inhaler 2 puffs every 4 hours as needed for cough, wheeze or shortness of breath    C.  Flovent 166mg 2 puffs twice a day                D.  Allegra 1839mdaily as needed and prior to immunotherapy injection               E.  Famotidine 20 mg as needed    F.  Hydroxyzine 10 mg - 1-2 tablets up to 3 times a day as needed for allergic reaction    H.  Have access to your Epipen    I.   Use prednisone [id number] needed for allergic reaction management     J.  Omeprazole 20-40mg23mly for 2-week duration of time  -We have discussed possibility of having mast cell activation syndrome which can be a spectrum of mastocytosis.  With normal tryptase on multiple draws  mastocytosis is much less likely however could be on the spectrum with continued allergic reaction symptoms.   Have advised to obtain 24hr urine studies if she develops symptoms to collect and see if she is having elevation of mast cell derivatives during these attacks.  She was provided with a new collection jug today - We have also discussed trial of Cromolyn again today to take prior to meals.  We will start at 100 mg prior to meals 1-3 times a day.  Will increase as necessary.  The goal with cromolyn is to provide more mast cell stabilization to help decrease risk of reaction. -We have discussed today if she has a of future reaction where she presents to an emergency department or urgent care to assess for a tryptase draw. -I have ordered IgE levels for gums that were ingredients in the 2 products she had a reaction with recently - Continue allergen immunotherapy (allergy shots) at maintenance dosing!.  Bring your Epipen on days of your injections.   Continue antihistamine pre-medication regimen prior to allergy shot at this time (trying to decrease what is needed --continue decreased dose of hydroxyzine 10mg)107mnce back at maintenance will go to every other week injections. - Continue to keep track of reactions and note any triggers.   - [facility] for palpitations.  ENT, GI and cardiology evaluations has been reassuring to-date.  -Once she has made it back to maintenance immunotherapy regimen from new vial start will start doing food challenges in the office again.   - discussed Covid vaccine with her again.  She does not have any reactions to vaccines in the past.  People with common allergies to medications, foods, aeroallergens, venom and latex are probably no more likely than the general public to have an allergic reaction to the mRNA COVID-19  ([place]) or J&J vaccines.  Thus, I recommend that she receive the COVID vaccine.   Her OB/GYN recommended she not receive the J&J vaccine thus we discussed the Pf[employer].  When she is ready to get the mRNA based vaccine she will have  access to her epinephrine device and will go with another person and will wait for a 30 minute observation time.     I appreciate the opportunity to take part in [name] [name] care. Please do not hesitate to contact me with questions.  Sincerely,   Prudy Feeler, MD Allergy/Immunology [facility] [place] [facility] [place]

## 2020-06-19 NOTE — Patient Instructions (Addendum)
Allergic reaction/Food allergy  Allergic rhinitis with OAS GERD Reactive airway, allergen driven Covid vaccine counseling   - Continue medications below:                A.  Dymista 1 spray each nostril twice a day or Flonase (if dymista unavailable) 1-2 sprays each nostril daily for nasal congestion/drainage.  Provided with a saline rinse kit to perform rinses prior to using medicated nasal sprays.  Also recommended using rinse if she is outside to flush out pollens or other inhalants that will get into the nose.  She has been advised to use either distilled water or boiled water and bring down to room temperature for use.               B.  Albuterol inhaler 2 puffs every 4 hours as needed for cough, wheeze or shortness of breath    C.  Flovent 143mg 2 puffs twice a day                D.  Allegra 1870mdaily as needed and prior to immunotherapy injection               E.  Famotidine 20 mg as needed    F.  Hydroxyzine 10 mg - 1-2 tablets up to 3 times a day as needed for allergic reaction    H.  Have access to your Epipen    I.   Use prednisone [id number] needed for allergic reaction management     J.  Omeprazole 20-40mg23mly for 2-week duration of time  -We have discussed possibility of having mast cell activation syndrome which can be a spectrum of mastocytosis.  With normal tryptase on multiple draws mastocytosis is much less likely however could be on the spectrum with continued allergic reaction symptoms.   Have advised to obtain 24hr urine studies if she develops symptoms to collect and see if she is having elevation of mast cell derivatives during these attacks.  She was provided with a new collection jug today - We have also discussed trial of Cromolyn again today to take prior to meals.  We will start at 100 mg prior to meals 1-3 times a day.  Will increase as necessary.  The goal with cromolyn is to provide more mast cell stabilization to help decrease risk of reaction. -We have  discussed today if she has a of future reaction where she presents to an emergency department or urgent care to assess for a tryptase draw. -I have ordered IgE levels for gums that were ingredients in the 2 products she had a reaction with recently - Continue allergen immunotherapy (allergy shots) at maintenance dosing!.  Bring your Epipen on days of your injections.   Continue antihistamine pre-medication regimen prior to allergy shot at this time (trying to decrease what is needed --continue decreased dose of hydroxyzine 10mg)60mnce back at maintenance will go to every other week injections. - Continue to keep track of reactions and note any triggers.   - [facility] palpitations.  ENT, GI and cardiology evaluations has been reassuring to-date.  -Once she has made it back to maintenance immunotherapy regimen from new vial start will start doing food challenges in the office again.   - discussed Covid vaccine with her again.  She does not have any reactions to vaccines in the past.  People with common allergies to medications, foods, aeroallergens, venom and  latex are probably no more likely than the general public to have an allergic reaction to the mRNA COVID-19  (Moderna and Pfizer) or J&J vaccines.  Thus, I recommend that she receive the COVID vaccine.  Her OB/GYN recommended she not receive the J&J vaccine thus we discussed the [place] vs Moderna.  When she is ready to get the mRNA based vaccine she will have access to her epinephrine device and will go with another person and will wait for a 30 minute observation time.

## 2020-06-22 LAB — F297-IGE ACACIA GUM: F297-IgE Acacia Gum: <0.1 kU/L

## 2020-06-22 LAB — XANTHAN GUM IGE
Class Interpretation: 0
Xanthan Gum, IgE*: <0.35 kU/L (ref <0.35)

## 2020-06-22 LAB — TRYPTASE: Tryptase: 2.8 ug/L (ref 2.2–13.2)

## 2020-06-24 ENCOUNTER — Ambulatory Visit (INDEPENDENT_AMBULATORY_CARE_PROVIDER_SITE_OTHER)

## 2020-06-24 DIAGNOSIS — J309 Allergic rhinitis, unspecified: Secondary | ICD-10-CM

## 2020-06-24 DIAGNOSIS — T7840XD Allergy, unspecified, subsequent encounter: Secondary | ICD-10-CM

## 2020-07-01 ENCOUNTER — Ambulatory Visit (INDEPENDENT_AMBULATORY_CARE_PROVIDER_SITE_OTHER)

## 2020-07-01 ENCOUNTER — Ambulatory Visit: Payer: Self-pay

## 2020-07-01 DIAGNOSIS — J309 Allergic rhinitis, unspecified: Secondary | ICD-10-CM | POA: Diagnosis not present

## 2020-07-07 ENCOUNTER — Ambulatory Visit (INDEPENDENT_AMBULATORY_CARE_PROVIDER_SITE_OTHER)

## 2020-07-07 DIAGNOSIS — J309 Allergic rhinitis, unspecified: Secondary | ICD-10-CM

## 2020-07-21 ENCOUNTER — Ambulatory Visit (INDEPENDENT_AMBULATORY_CARE_PROVIDER_SITE_OTHER)

## 2020-07-21 DIAGNOSIS — J309 Allergic rhinitis, unspecified: Secondary | ICD-10-CM

## 2020-07-22 ENCOUNTER — Other Ambulatory Visit: Payer: Self-pay

## 2020-07-22 DIAGNOSIS — J4540 Moderate persistent asthma, uncomplicated: Secondary | ICD-10-CM

## 2020-07-24 LAB — SARS-COV-2 ANTIBODIES: SARS-CoV-2 Antibodies: NEGATIVE

## 2020-07-27 ENCOUNTER — Other Ambulatory Visit: Payer: Self-pay | Admitting: *Deleted

## 2020-07-27 ENCOUNTER — Telehealth: Payer: Self-pay | Admitting: *Deleted

## 2020-07-27 MED ORDER — EPINEPHRINE 0.3 MG/0.3ML IJ SOAJ: 0.3000 mg | INTRAMUSCULAR | 1 refills | Status: DC | PRN

## 2020-07-27 NOTE — Telephone Encounter (Signed)
-----   Message from Shaylar Patricia Padgett, MD sent at 07/27/2020  9:59 AM EDT ----- °Regarding: covid vaccine °Just an FYI.     She is planning to get covid vaccine tomorrow (Thursday) at a pharmacy nearby.  She is very nervous regarding this and asked if she could come to the office after her 30 min wait at the pharmacy in case she reacted (I don't have a reason for her too).  To ease her mind I told her if she would feel more comfortable she can come and wait in parking lot (as not to occupy space in office).   So if she comes into office tomorrow it is most likely due to some issue with the vaccine.   ° °

## 2020-07-27 NOTE — Telephone Encounter (Signed)
Per Dr. Delorse Lek:  Delorse Lek, Pilar Grammes, MD  [facility]; Illene Bolus, CMA [place].   She is planning to get covid vaccine tomorrow (Thursday) at a pharmacy nearby. She is very nervous regarding this and asked if she could come to the office after her 30 min wait at the pharmacy in case she reacted (I don't have a reason for her too). To ease her mind I told her if she would feel more comfortable she can come and wait in parking lot (as not to occupy space in office).  So if she comes into office tomorrow it is most likely due to some issue with the vaccine.

## 2020-07-27 NOTE — Telephone Encounter (Signed)
-----   Message from [facility] Larose Hires, MD sent at 07/27/2020  9:59 AM EDT ----- Regarding: covid vaccine Just an FYI.     She is planning to get covid vaccine tomorrow (Thursday) at a pharmacy nearby.  She is very nervous regarding this and asked if she could come to the office after her 30 min wait at the pharmacy in case she reacted (I don't have a reason for her too).  To ease her mind I told her if she would feel more comfortable she can come and wait in parking lot (as not to occupy space in office).   So if she comes into office tomorrow it is most likely due to some issue with the vaccine.

## 2020-07-28 NOTE — Telephone Encounter (Signed)
LETTER COMPOSED AND RELEASED.

## 2020-07-28 NOTE — Telephone Encounter (Signed)
Please create letter below as follows and send via mychart: ----------------------------------  To whom it may concern:   Please excuse [name] [name] from outdoor physical activity due to her history of allergic rhinitis/conjunctivitis driven by aeroallergens, namely pollens. I would not advise participation in any outdoor physical activities at this time as she has high risk of symptom development with prolonged outdoor exposure.  For any questions feel free to contact my office.   Sincerely,   Margo Aye, MD [facility]

## 2020-07-28 NOTE — Telephone Encounter (Signed)
Patient called and states that she got her vaccine, Pfizer, yesterday and it went well. She didn't feel that great after getting the shot, but feels better today.   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 Delorse Lek to write a letter stating that she does not advise this due to patient's history of tree pollen and allergies, as well as the recent vaccination.   Patient's mychart is not working properly. Patient states if we call her and cannot get her, please send a mychart message asking her to contact the office. Patient can only see the first line of her mychart messages.

## 2020-07-29 ENCOUNTER — Encounter: Payer: Self-pay | Admitting: Allergy

## 2020-07-29 NOTE — Progress Notes (Signed)
To whom it may concern:   This letter is written on behalf of our patient [name] [name] regarding her history of allergic reactions with known causes including food and environmental allergens. She has experienced severe respiratory and allergic reaction symptoms with exposure to outdoor pollens and thus it is my recommendation that she avoid outdoor activities at this time. Please excuse her from training and other outdoor activities. It is recommended that she be within 20 miles of our office and have the ability to prepare/cook her own foods as well. For any questions feel free to contact the office.   Sincerely,      Margo Aye, MD  [facility]  [facility]

## 2020-08-11 ENCOUNTER — Encounter: Payer: Self-pay | Admitting: Allergy

## 2020-08-11 ENCOUNTER — Other Ambulatory Visit: Payer: Self-pay

## 2020-08-11 ENCOUNTER — Ambulatory Visit (INDEPENDENT_AMBULATORY_CARE_PROVIDER_SITE_OTHER): Admitting: Allergy

## 2020-08-11 VITALS — BP 102/64 | HR 79 | Temp 98.3°F | Resp 16

## 2020-08-11 DIAGNOSIS — J3089 Other allergic rhinitis: Secondary | ICD-10-CM | POA: Diagnosis not present

## 2020-08-11 DIAGNOSIS — J4540 Moderate persistent asthma, uncomplicated: Secondary | ICD-10-CM

## 2020-08-11 DIAGNOSIS — T781XXD Other adverse food reactions, not elsewhere classified, subsequent encounter: Secondary | ICD-10-CM | POA: Diagnosis not present

## 2020-08-11 DIAGNOSIS — K2100 Gastro-esophageal reflux disease with esophagitis, without bleeding: Secondary | ICD-10-CM

## 2020-08-11 DIAGNOSIS — T7840XD Allergy, unspecified, subsequent encounter: Secondary | ICD-10-CM

## 2020-08-12 NOTE — Progress Notes (Signed)
Follow-up Note  RE: [name] [name] MRN: [id number] DOB: November 06, 1986 Date of Office Visit: 08/11/2020   History of present illness: [name] [name] is a 34 y.o. female presenting today for follow-up of allergic reaction, allergic rhinitis with oral allergy syndrome, reflux, reactive airway.  She was last seen in the office on 06/16/2020 by myself.  Since her last visit she did receive her first dose of Pfizer vaccine at a pharmacy.  She did do her normal premedication regimen with her antihistamines including hydroxyzine prior.  She states she did note some tingling and numbness in the arm right after the administration like a nerve shock and also felt some numbness in her leg.  She states she did move about the pharmacy to help distract herself and things improved and she did not have any further issues related to the injection.  She did state that the following Tuesday she noted some throat fullness and some shortness of breath that she did use her inhaler for.  She states that nothing in her diet or activities have changed.  She did take a low-dose of hydroxyzine to help with the symptoms.  She is planning to get her second dose on the 15th or 16th of this month.  She does want to start performing some food challenges to see if she can reintroduce more items into her diet.  The cromolyn was not started [place] and as she wanted to ensure that did not contain any ingredients that she has had reactions with.    Review of systems in the past 4 weeks: Review of Systems  Constitutional: Negative.   HENT: Positive for congestion.   Eyes: Negative.   Respiratory: Positive for shortness of breath.   Cardiovascular: Negative.   Gastrointestinal: Negative.   Musculoskeletal: Negative.   Skin: Negative.     All other systems negative unless noted above in HPI  Past medical/social/surgical/family history have been reviewed and are unchanged unless specifically indicated  below.  No changes  Medication List: Current Outpatient Medications  Medication Sig Dispense Refill  . albuterol (PROAIR HFA) 108 (90 Base) MCG/ACT inhaler Inhale 2 puffs into the lungs every 6 (six) hours as needed for wheezing or shortness of breath. With spacer 18 g 1  . cetirizine (ZYRTEC) 5 MG tablet Take 1 tablet (5 mg total) by mouth daily as needed. 90 tablet 1  . cromolyn (GASTROCROM) 100 MG/5ML solution Take 5 ml  three times daily prior to meals 600 mL 3  . EPINEPHrine 0.3 mg/0.3 mL IJ SOAJ injection Inject 0.3 mLs (0.3 mg total) into the muscle as needed for anaphylaxis. 2 each 1  . famotidine (PEPCID) 40 MG tablet Take 1 tablet (40 mg total) by mouth 2 (two) times daily. 60 tablet 5  . fexofenadine (ALLEGRA) 180 MG tablet Take 180 mg by mouth daily.    . Fluocinolone Acetonide Scalp 0.01 % OIL Apply 1 application topically daily as needed (itching).     . fluticasone (FLONASE) 50 MCG/ACT nasal spray Place 1 spray into both nostrils as needed for allergies or rhinitis. 16 g 5  . fluticasone (FLOVENT HFA) 110 MCG/ACT inhaler Inhale 2 puffs into the lungs 2 (two) times daily as needed. (Patient taking differently: Inhale 2 puffs into the lungs 2 (two) times daily as needed (wheezing). ) 1 Inhaler 5  . hydrOXYzine (ATARAX/VISTARIL) 10 MG tablet Take 1-2 tablets three times a day as needed. (Patient taking differently: Take 10-20 mg  by mouth 3 (three) times daily as needed for itching or anxiety. ) 90 tablet 1  . loratadine (CLARITIN) 10 MG tablet Take 1 tablet (10 mg total) by mouth daily as needed. 90 tablet 1  . nitrofurantoin, macrocrystal-monohydrate, (MACROBID) 100 MG capsule Take 100 mg by mouth See admin instructions. 1 tablet after intercourse    . omeprazole (PRILOSEC) 20 MG capsule Take 1 capsule (20 mg total) by mouth daily. (Patient taking differently: Take 20 mg by mouth daily as needed (heart burn). ) 90 capsule 1  . Selenium Sulfide 2.3 % SHAM Apply 1 application topically  daily as needed. For dandruff    . Spacer/Aero-Holding Dorise Bullion Use as directed with albuterol hfa inhaler. 1 each 0   No current facility-administered medications for this visit.     Known medication allergies: Allergies  Allergen Reactions  . Diphenhydramine Other (See Comments)    Chest Pain Chest Pain Chest Pain  . Other     Tree nuts   . Peanut-Containing Drug Products Anaphylaxis  . Corn-Containing Products   . Soy Allergy   . Tobramycin Other (See Comments)    Caused dizziness  . Ketoconazole Rash     Physical examination: Blood pressure 102/64, pulse 79, temperature 98.3 F (36.8 C), temperature source Temporal, resp. rate 16, SpO2 99 %.  General: Alert, interactive, in no acute distress. HEENT: PERRLA, TMs pearly gray, right turbinate moderately edematous with clear discharge, left turbinate is minimally edematous without discharge, post-pharynx non erythematous. Neck: Supple without lymphadenopathy. Lungs: Clear to auscultation without wheezing, rhonchi or rales. {no increased work of breathing. CV: Normal S1, S2 without murmurs. Abdomen: Nondistended, nontender. Skin: Warm and dry, without lesions or rashes. Extremities:  No clubbing, cyanosis or edema. Neuro:   Grossly intact.  Diagnositics/Labs: None today  Assessment and plan:   Allergic reaction/Food allergy  Allergic rhinitis with OAS GERD Reactive airway, allergen driven   - Continue medications below:                A.  Dymista 1 spray each nostril twice a day or Flonase (if dymista unavailable) 1-2 sprays each nostril daily for nasal congestion/drainage.  Provided with a saline rinse kit to perform rinses prior to using medicated nasal sprays.  Also recommended using rinse if she is outside to flush out pollens or other inhalants that will get into the nose.  She has been advised to use either distilled water or boiled water and bring down to room temperature for use.               B.   Albuterol inhaler 2 puffs every 4 hours as needed for cough, wheeze or shortness of breath    C.  Flovent 177mg 2 puffs twice a day                D.  Allegra 1838mdaily as needed and prior to immunotherapy injection               E.  Famotidine 20 mg as needed    F.  Hydroxyzine 10 mg - 1-2 tablets up to 3 times a day as needed for allergic reaction    H.  Have access to your Epipen    I.   Use prednisone 1043m68m57m needed for allergic reaction management     J.  Omeprazole 20-40mg81mly for 2-week duration of time  -We have discussed possibility of having mast cell activation syndrome which can be a  spectrum of mastocytosis.  With normal tryptase on multiple draws mastocytosis is much less likely however could be on the spectrum with continued allergic reaction symptoms.   Have advised to obtain 24hr urine studies if she develops symptoms to collect and see if she is having elevation of mast cell derivatives during these attacks.  She has collection jug. - We have also discussed trial of Cromolyn to take prior to meals.  Would start at 100 mg prior to meals 1-3 times a day.  Increase as necessary.  The goal with cromolyn is to provide more mast cell stabilization to help decrease risk of reaction.  Only comes in liquid formulation will ensure does not contain any ingredients that she has reactions with - if she has a of future reaction where she presents to an emergency department or urgent care to assess for a tryptase draw. - Continue allergen immunotherapy (allergy shots) at maintenance dosing!.  Bring your Epipen on days of your injections.   Continue antihistamine pre-medication regimen prior to allergy shot at this time (continue decreased dose of hydroxyzine 56m).   - Continue to keep track of reactions and note any triggers.   - [facility].  ENT, GI and cardiology evaluations has been reassuring to-date.   -Appointment scheduled next week to do a rice challenge.  Discussed to hold her antihistamine starting Tuesday onward and to bring in cooked rice and she can season if she would like with salt-and-pepper -Received first PTy Tyd[place] and is set to receive her second dose on 08/24/2020 or 08/25/2020    I appreciate the opportunity to take part in Charla's care. Please do not hesitate to contact me with questions.  Sincerely,   SPrudy Feeler MD [facility]ergy[facility] [place]

## 2020-08-12 NOTE — Patient Instructions (Signed)
Allergic reaction/Food allergy  Allergic rhinitis with OAS GERD Reactive airway, allergen driven   - Continue medications below:                A.  Dymista 1 spray each nostril twice a day or Flonase (if dymista unavailable) 1-2 sprays each nostril daily for nasal congestion/drainage.  Provided with a saline rinse kit to perform rinses prior to using medicated nasal sprays.  Also recommended using rinse if she is outside to flush out pollens or other inhalants that will get into the nose.  She has been advised to use either distilled water or boiled water and bring down to room temperature for use.               B.  Albuterol inhaler 2 puffs every 4 hours as needed for cough, wheeze or shortness of breath    C.  Flovent 127mg 2 puffs twice a day                D.  Allegra 1859mdaily as needed and prior to immunotherapy injection               E.  Famotidine 20 mg as needed    F.  Hydroxyzine 10 mg - 1-2 tablets up to 3 times a day as needed for allergic reaction    H.  Have access to your Epipen    I.   Use prednisone 1025m57m75m needed for allergic reaction management     J.  Omeprazole 20-40mg91mly for 2-week duration of time  -We have discussed possibility of having mast cell activation syndrome which can be a spectrum of mastocytosis.  With normal tryptase on multiple draws mastocytosis is much less likely however could be on the spectrum with continued allergic reaction symptoms.   Have advised to obtain 24hr urine studies if she develops symptoms to collect and see if she is having elevation of mast cell derivatives during these attacks.  She has collection jug. - We have also discussed trial of Cromolyn to take prior to meals.  Would start at 100 mg prior to meals 1-3 times a day.  Increase as necessary.  The goal with cromolyn is to provide more mast cell stabilization to help decrease risk of reaction.  Only comes in liquid formulation will ensure does not contain any ingredients  that she has reactions with - if she has a of future reaction where she presents to an emergency department or urgent care to assess for a tryptase draw. - Continue allergen immunotherapy (allergy shots) at maintenance dosing!.  Bring your Epipen on days of your injections.   Continue antihistamine pre-medication regimen prior to allergy shot at this time (continue decreased dose of hydroxyzine 10mg)52m- Continue to keep track of reactions and note any triggers.   - has [facility]ugar [facility] for palpitations.  ENT, GI and cardiology evaluations has been reassuring to-date.  -Appointment scheduled next week to do a rice challenge.  Discussed to hold her antihistamine starting Tuesday onward and to bring in cooked rice and she can season if she would like with salt-and-pepper -[place] to receive her second dose on 08/24/2020 or 08/25/2020

## 2020-08-18 ENCOUNTER — Encounter: Payer: Self-pay | Admitting: Allergy

## 2020-08-18 ENCOUNTER — Ambulatory Visit (INDEPENDENT_AMBULATORY_CARE_PROVIDER_SITE_OTHER): Admitting: Allergy

## 2020-08-18 ENCOUNTER — Other Ambulatory Visit: Payer: Self-pay

## 2020-08-18 VITALS — BP 110/70 | HR 78 | Resp 18 | Ht 62.0 in | Wt 135.0 lb

## 2020-08-18 DIAGNOSIS — K2100 Gastro-esophageal reflux disease with esophagitis, without bleeding: Secondary | ICD-10-CM

## 2020-08-18 DIAGNOSIS — J3089 Other allergic rhinitis: Secondary | ICD-10-CM

## 2020-08-18 DIAGNOSIS — T7840XD Allergy, unspecified, subsequent encounter: Secondary | ICD-10-CM

## 2020-08-18 DIAGNOSIS — J4540 Moderate persistent asthma, uncomplicated: Secondary | ICD-10-CM

## 2020-08-18 DIAGNOSIS — T781XXD Other adverse food reactions, not elsewhere classified, subsequent encounter: Secondary | ICD-10-CM

## 2020-08-18 NOTE — Progress Notes (Signed)
Follow-up Note  RE: [name] [name] MRN: [id number] DOB: November 27, 1986 Date of Office Visit: 08/18/2020   History of present illness: [name] [name] is a 34 y.o. female presenting today for food challenge to rice.  She was last seen in the office on 08/11/20 by myself.   She has held her antihistamines for challenge today.  States there was just once time while off antihistamine she felt like she may need to use flovent but didn't.  Having some congestion today but otherwise feeling in good health.    Review of systems: Review of Systems  Constitutional: Negative.   HENT: Positive for congestion.   Eyes: Negative.   Respiratory: Negative.   Cardiovascular: Negative.   Gastrointestinal: Negative.   Musculoskeletal: Negative.   Skin: Negative.   Neurological: Negative.     All other systems negative unless noted above in HPI  Past medical/social/surgical/family history have been reviewed and are unchanged unless specifically indicated below.  No changes  Medication List: Current Outpatient Medications  Medication Sig Dispense Refill  . albuterol (PROAIR HFA) 108 (90 Base) MCG/ACT inhaler Inhale 2 puffs into the lungs every 6 (six) hours as needed for wheezing or shortness of breath. With spacer 18 g 1  . cetirizine (ZYRTEC) 5 MG tablet Take 1 tablet (5 mg total) by mouth daily as needed. 90 tablet 1  . cromolyn (GASTROCROM) 100 MG/5ML solution Take 5 ml  three times daily prior to meals 600 mL 3  . EPINEPHrine 0.3 mg/0.3 mL IJ SOAJ injection Inject 0.3 mLs (0.3 mg total) into the muscle as needed for anaphylaxis. 2 each 1  . famotidine (PEPCID) 40 MG tablet Take 1 tablet (40 mg total) by mouth 2 (two) times daily. 60 tablet 5  . fexofenadine (ALLEGRA) 180 MG tablet Take 180 mg by mouth daily.    . Fluocinolone Acetonide Scalp 0.01 % OIL Apply 1 application topically daily as needed (itching).     . fluticasone (FLONASE) 50 MCG/ACT nasal spray Place 1 spray into both nostrils as needed  for allergies or rhinitis. 16 g 5  . fluticasone (FLOVENT HFA) 110 MCG/ACT inhaler Inhale 2 puffs into the lungs 2 (two) times daily as needed. (Patient taking differently: Inhale 2 puffs into the lungs 2 (two) times daily as needed (wheezing). ) 1 Inhaler 5  . hydrOXYzine (ATARAX/VISTARIL) 10 MG tablet Take 1-2 tablets three times a day as needed. (Patient taking differently: Take 10-20 mg by mouth 3 (three) times daily as needed for itching or anxiety. ) 90 tablet 1  . loratadine (CLARITIN) 10 MG tablet Take 1 tablet (10 mg total) by mouth daily as needed. 90 tablet 1  . nitrofurantoin, macrocrystal-monohydrate, (MACROBID) 100 MG capsule Take 100 mg by mouth See admin instructions. 1 tablet after intercourse    . omeprazole (PRILOSEC) 20 MG capsule Take 1 capsule (20 mg total) by mouth daily. (Patient taking differently: Take 20 mg by mouth daily as needed (heart burn). ) 90 capsule 1  . Selenium Sulfide 2.3 % SHAM Apply 1 application topically daily as needed. For dandruff    . Spacer/Aero-Holding Rudean Curt Use as directed with albuterol hfa inhaler. 1 each 0   No current facility-administered medications for this visit.     Known medication allergies: Allergies  Allergen Reactions  . Diphenhydramine Other (See Comments)    Chest Pain Chest Pain Chest Pain  . Other     Tree nuts   . Peanut-Containing Drug Products Anaphylaxis  . Corn-Containing Products   .  Soy Allergy   . Tobramycin Other (See Comments)    Caused dizziness  . Ketoconazole Rash     Physical examination: Blood pressure 110/70, pulse 78, resp. rate 18, height 5\' 2"  (1.575 m), weight 135 lb (61.2 kg), SpO2 97 %.  General: Alert, interactive, in no acute distress. HEENT: PERRLA, TMs pearly gray, turbinates moderately edematous without discharge, post-pharynx non erythematous. Neck: Supple without lymphadenopathy. Lungs: Clear to auscultation without wheezing, rhonchi or rales. {no increased work of  breathing. CV: Normal S1, S2 without murmurs. Abdomen: Nondistended, nontender. Skin: Warm and dry, without lesions or rashes. Extremities:  No clubbing, cyanosis or edema. Neuro:   Grossly intact.  Diagnositics/Labs: Food challenge to rice with use of rice . Benefits and risks of challenge discussed and written consent obtained.  She was provided with increasing doses of cooked rice every 15 minutes and consumed total of 17 tsp.  She was observed for additional hour after completion of ingestion challenge.  She had no signs/symptoms of allergic reaction.  Vitals were obtained prior to discharge and remained stable.    Assessment and plan: Allergic reaction/Food allergy  Allergic rhinitis with OAS GERD Reactive airway, allergen driven   Rice challenge passed today! Incorporate rice and rice products in the diet starting tomorrow on. Can resume antihistamine use Will plan for oat challenge next with oatmeal   I appreciate the opportunity to take part in [name] [name]'s care. Please do not hesitate to contact me with questions.  Sincerely,   , MD Allergy/Immunology [facility]

## 2020-08-18 NOTE — Patient Instructions (Addendum)
Rice challenge passed today! Incorporate rice and rice products in the diet starting tomorrow on. Can resume antihistamine use Will plan forAllergic reaction/Food allergy  Allergic rhinitis with OAS GERD Reactive airway, allergen driven   - Continue medications below:                A.  Dymista 1 spray each nostril twice a day or Flonase (if dymista unavailable) 1-2 sprays each nostril daily for nasal congestion/drainage.  Provided with a saline rinse kit to perform rinses prior to using medicated nasal sprays.  Also recommended using rinse if she is outside to flush out pollens or other inhalants that will get into the nose.  She has been advised to use either distilled water or boiled water and bring down to room temperature for use.               B.  Albuterol inhaler 2 puffs every 4 hours as needed for cough, wheeze or shortness of breath    C.  Flovent 19mg 2 puffs twice a day                D.  Allegra 1825mdaily as needed and prior to immunotherapy injection               E.  Famotidine 20 mg as needed    F.  Hydroxyzine 10 mg - 1-2 tablets up to 3 times a day as needed for allergic reaction    H.  Have access to your Epipen    I.   Use prednisone [id number] needed for allergic reaction management     J.  Omeprazole 20-40mg26mly for 2-week duration of time  -We have discussed possibility of having mast cell activation syndrome which can be a spectrum of mastocytosis.  With normal tryptase on multiple draws mastocytosis is much less likely however could be on the spectrum with continued allergic reaction symptoms.   Have advised to obtain 24hr urine studies if she develops symptoms to collect and see if she is having elevation of mast cell derivatives during these attacks.  She has collection jug. - We have also discussed trial of Cromolyn to take prior to meals.  Would start at 100 mg prior to meals 1-3 times a day.  Increase as necessary.  The goal with cromolyn is to provide  more mast cell stabilization to help decrease risk of reaction.  Only comes in liquid formulation will ensure does not contain any ingredients that she has reactions with - if she has a of future reaction where she presents to an emergency department or urgent care to assess for a tryptase draw. - Continue allergen immunotherapy (allergy shots) at maintenance dosing!.  Bring your Epipen on days of your injections.   Continue antihistamine pre-medication regimen prior to allergy shot at this time (continue decreased dose of hydroxyzine 10mg)58m- Continue to keep track of reactions and note any triggers.   - has been [facility] Lugar [facility] for palpitations.  ENT, GI and cardiology evaluations has been reassuring to-date.  -Appointment scheduled next week to do a rice challenge.  Discussed to hold her antihistamine starting Tuesday onward and to bring in cooked rice and she can season if she would like with salt-and-pepper -[place] receive her second dose on 08/24/2020 or 08/25/2020    oat challenge next with oatmeal

## 2020-08-26 ENCOUNTER — Ambulatory Visit (INDEPENDENT_AMBULATORY_CARE_PROVIDER_SITE_OTHER): Admitting: *Deleted

## 2020-08-26 DIAGNOSIS — J309 Allergic rhinitis, unspecified: Secondary | ICD-10-CM

## 2020-08-30 DIAGNOSIS — J301 Allergic rhinitis due to pollen: Secondary | ICD-10-CM | POA: Diagnosis not present

## 2020-08-30 NOTE — Progress Notes (Signed)
VIALS EXP 08-30-21 

## 2020-08-31 DIAGNOSIS — J3089 Other allergic rhinitis: Secondary | ICD-10-CM | POA: Diagnosis not present

## 2020-09-02 ENCOUNTER — Ambulatory Visit (INDEPENDENT_AMBULATORY_CARE_PROVIDER_SITE_OTHER)

## 2020-09-02 DIAGNOSIS — Z23 Encounter for immunization: Secondary | ICD-10-CM | POA: Diagnosis not present

## 2020-09-02 NOTE — Progress Notes (Signed)
   Covid-19 Vaccination Clinic  Name:  [name] [name]    MRN: [id number] DOB: 16-May-1986  09/02/2020  [name] [name] was observed post Covid-19 immunization for 30 minutes based on pre-vaccination screening without incident. She was provided with Vaccine Information Sheet and instruction to access the V-Safe system.   [name] [name] was instructed to call 911 with any severe reactions post vaccine: Marland Kitchen Difficulty breathing  . Swelling of face and throat  . A fast heartbeat  . A bad rash all over body  . Dizziness and weakness

## 2020-09-15 IMAGING — US US ABDOMEN COMPLETE
1 series · 14 of 25 positions shown · non-contrast
Comparison: 03/03/2019

CLINICAL DATA: Intermittent right upper quadrant pain for 3 years.

EXAM:
ABDOMEN ULTRASOUND COMPLETE

[Series 1: us abdomen complete · 0.09mm/px · 14 of 99 slices shown]
[im 1/99]
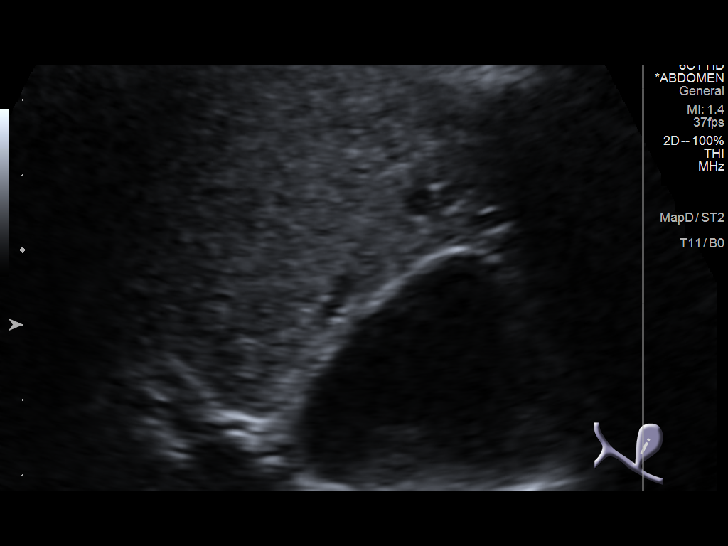
[im 9/99]
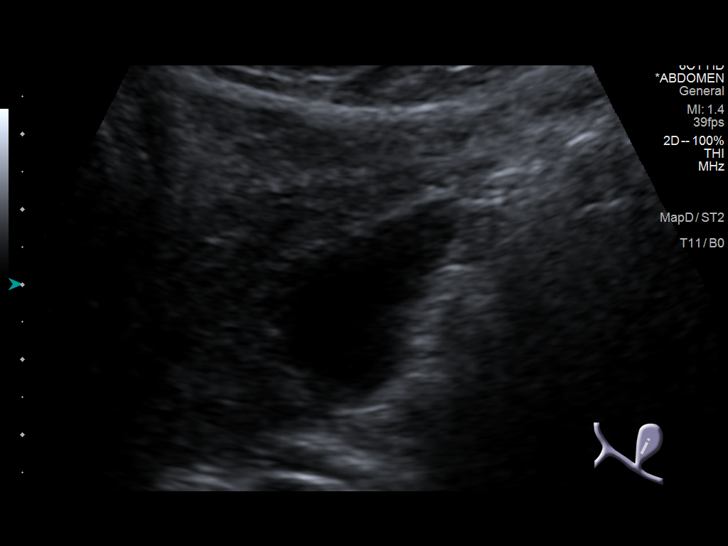
[im 17/99]
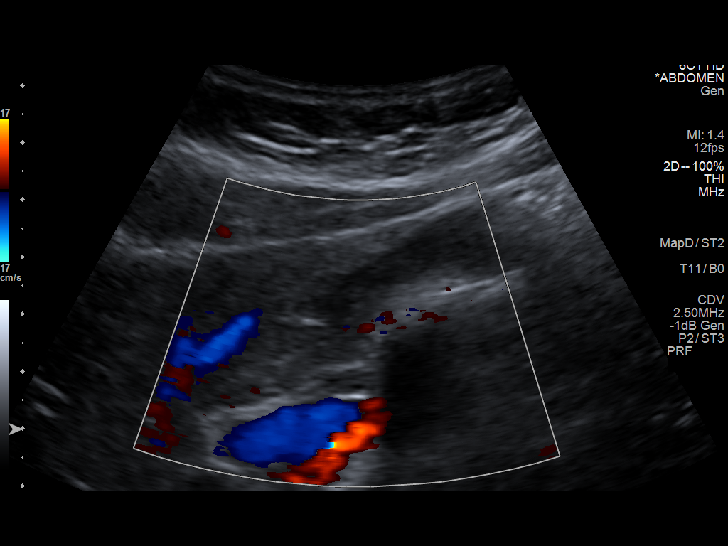
[im 25/99]
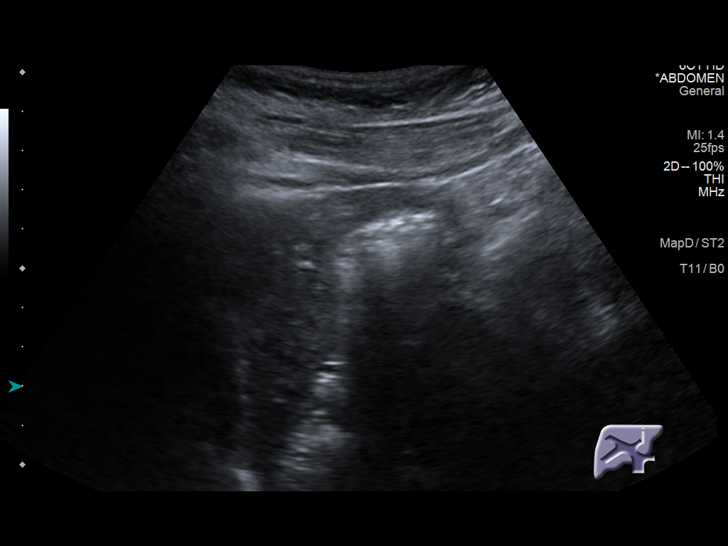
[im 33/99]
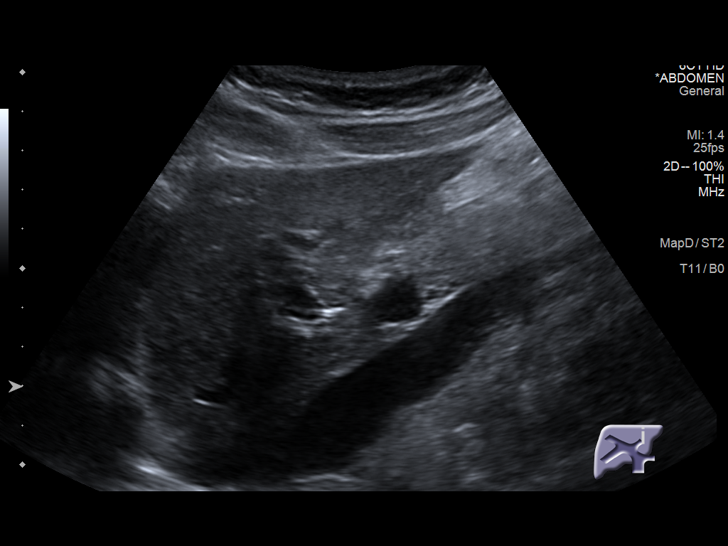
[im 37/99]
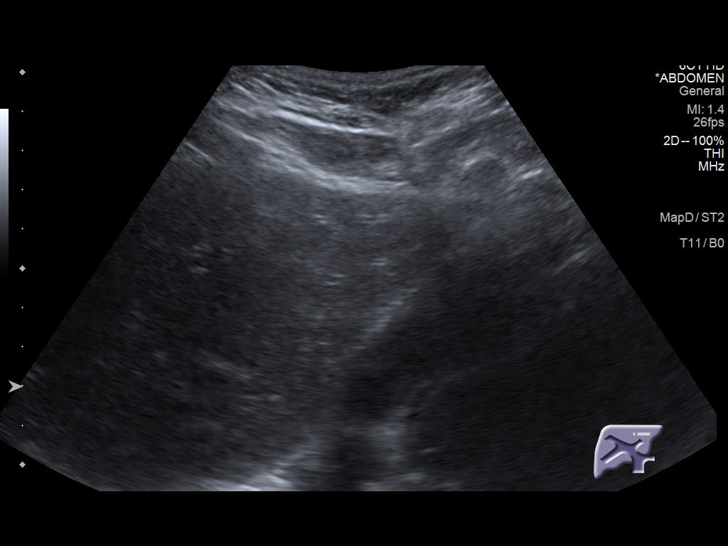
[im 45/99]
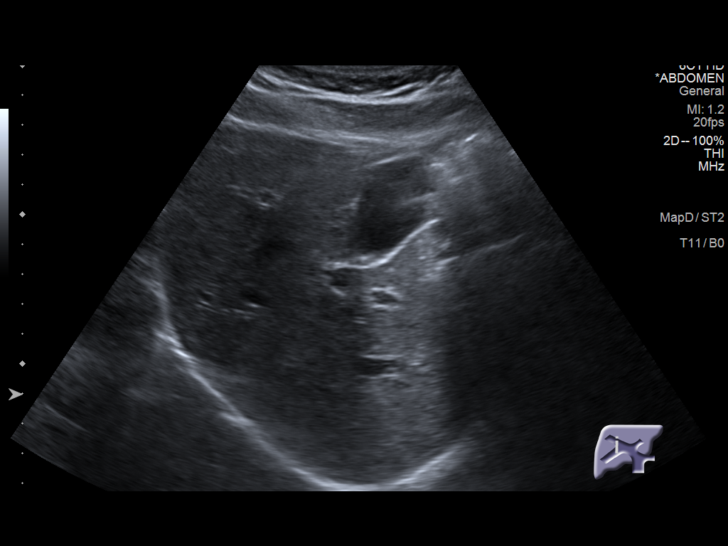
[im 54/99]
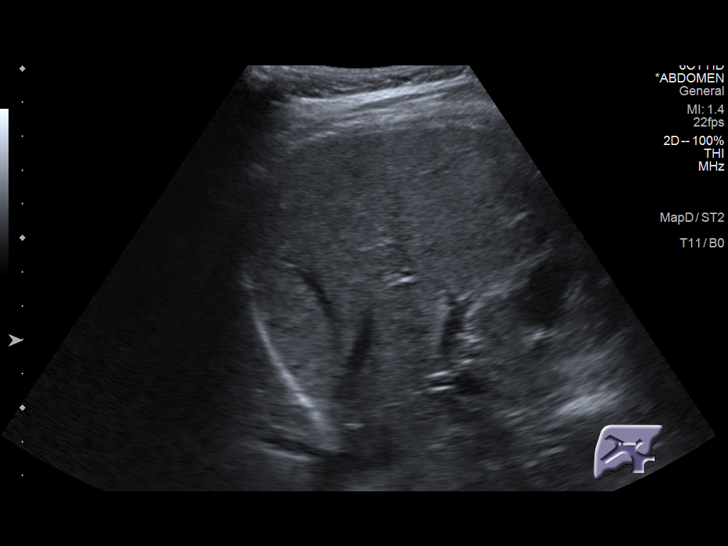
[im 62/99]
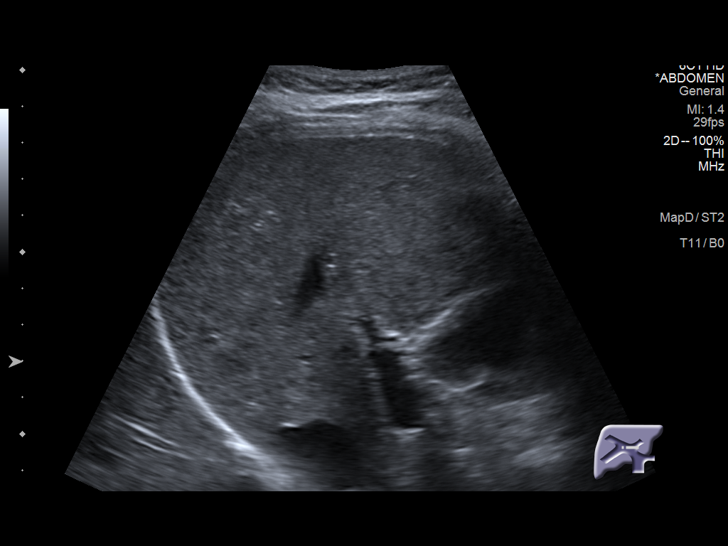
[im 66/99]
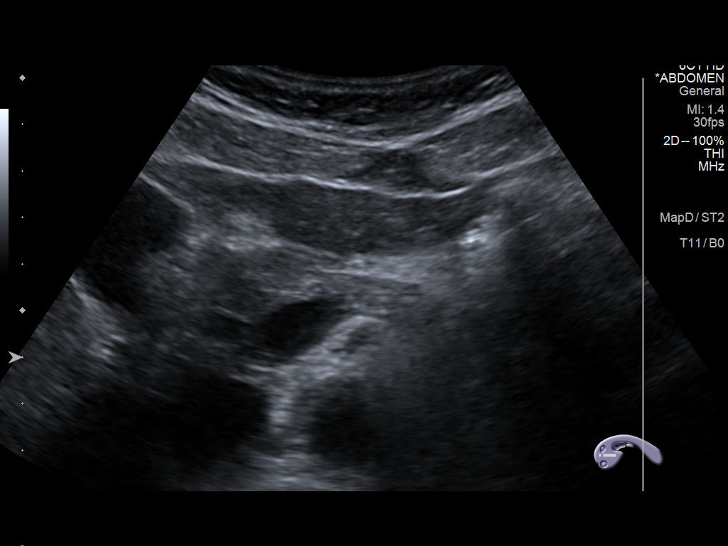
[im 74/99]
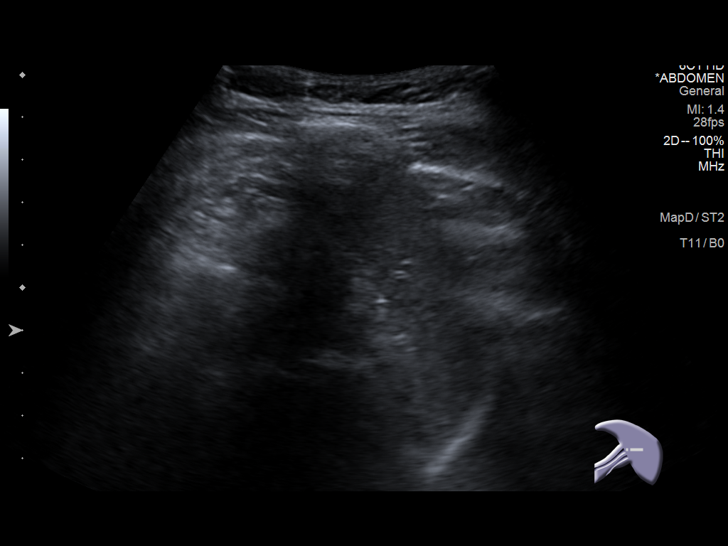
[im 82/99]
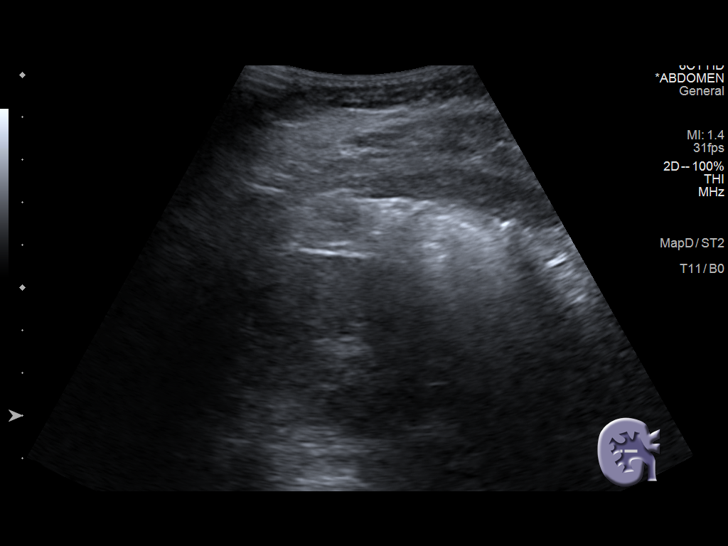
[im 90/99]
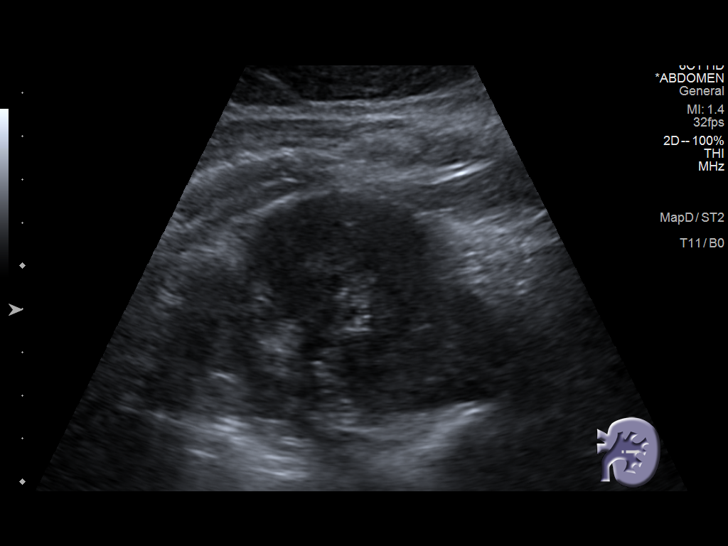
[im 99/99]
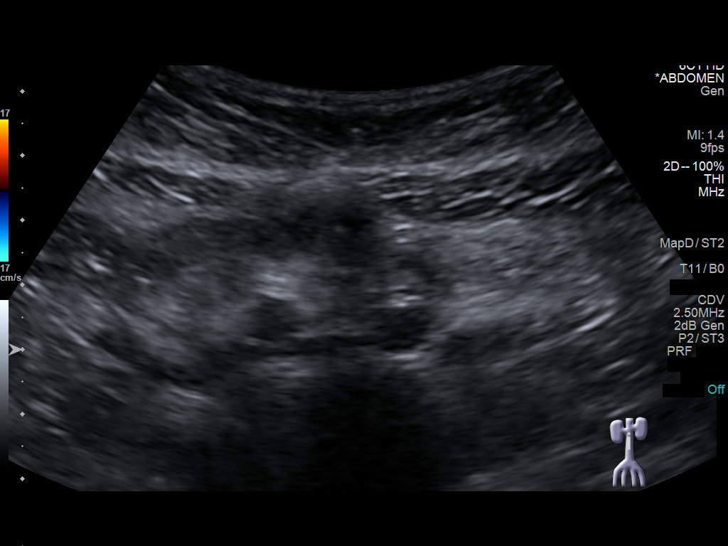

[14 of 25 positions shown; findings below may reference images not displayed]

FINDINGS: Gallbladder: No gallstones or wall thickening visualized. No
sonographic Murphy sign noted by sonographer.

Common bile duct: Diameter: 4 mm

Liver: No focal lesion identified. Within normal limits in
parenchymal echogenicity. Portal vein is patent on color Doppler
imaging with normal direction of blood flow towards the liver.

IVC: No abnormality visualized.

Pancreas: Visualized portion unremarkable.

Spleen: Size and appearance within normal limits.

Right Kidney: Length: 10.7 cm. Echogenicity within normal limits. No
mass or hydronephrosis visualized.

Left Kidney: Length: 10.9 cm. Echogenicity within normal limits. No
mass or hydronephrosis visualized.

Abdominal aorta: No aneurysm visualized.

Other findings: None.
IMPRESSION: Normal abdominal ultrasound.

## 2020-09-15 IMAGING — US US PELVIS COMPLETE WITH TRANSVAGINAL
2 series · 13 of 25 positions shown · non-contrast
Comparison: None

CLINICAL DATA: Severe cramping, dysmenorrhea, family history of
fibroids

EXAM:
TRANSABDOMINAL AND TRANSVAGINAL ULTRASOUND OF PELVIS
TECHNIQUE: Both transabdominal and transvaginal ultrasound examinations of the
pelvis were performed. Transabdominal technique was performed for
global imaging of the pelvis including uterus, ovaries, adnexal
regions, and pelvic cul-de-sac. It was necessary to proceed with
endovaginal exam following the transabdominal exam to visualize the
lower uterine segment and cervix.

[Series 1: us pelvis complete with transvaginal · 0.17mm/px · 1 of 1 slices shown (1 of 2)]
[im 1/1]
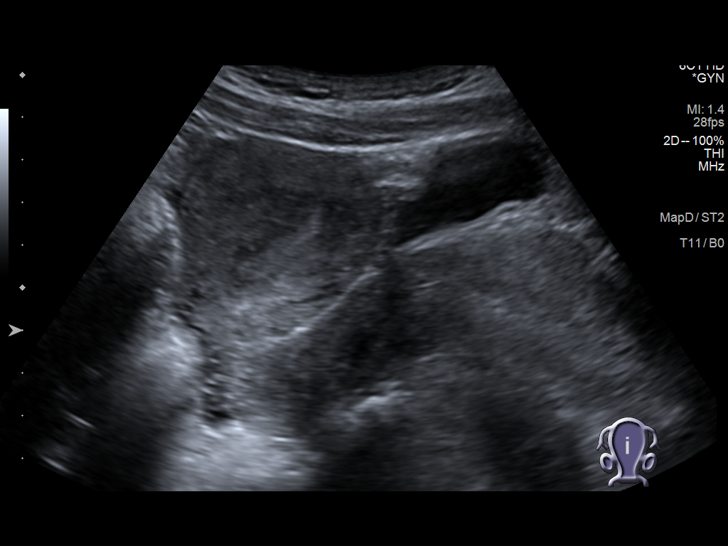

[Series 2001: us pelvis complete with transvaginal · 0.17mm/px · 12 of 104 slices shown (2 of 2)]
[im 5/104]
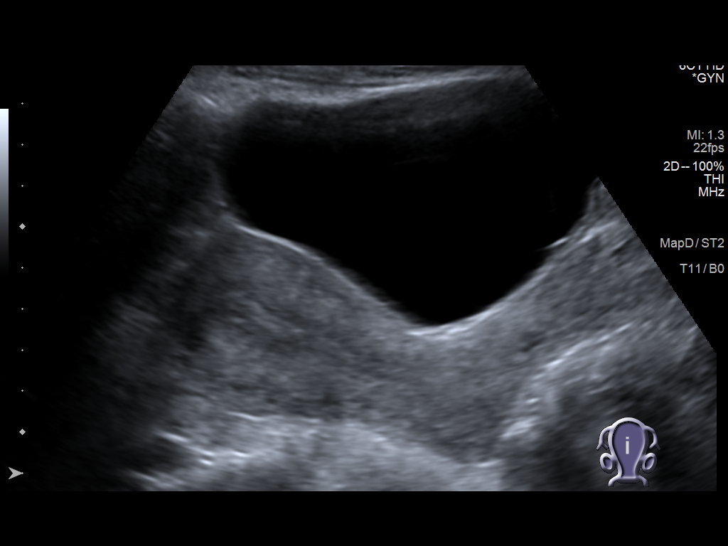
[im 14/104]
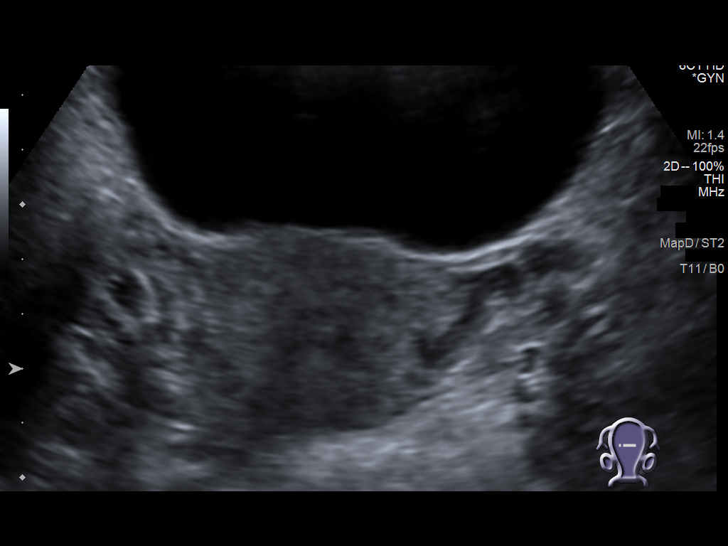
[im 23/104]
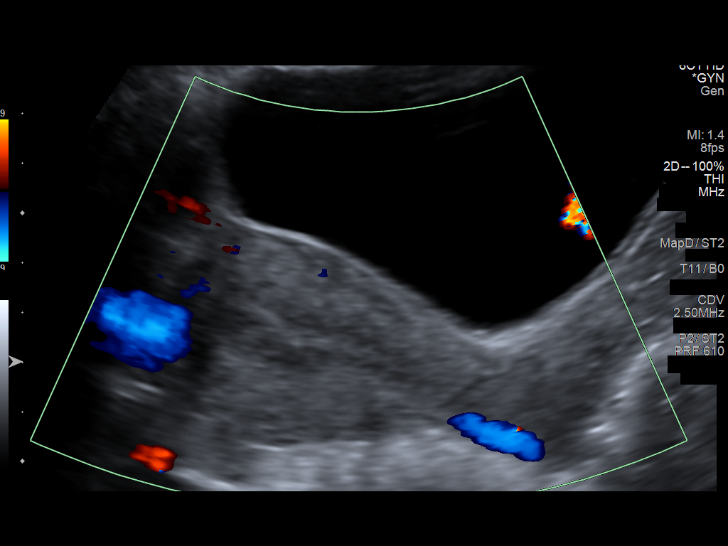
[im 32/104]
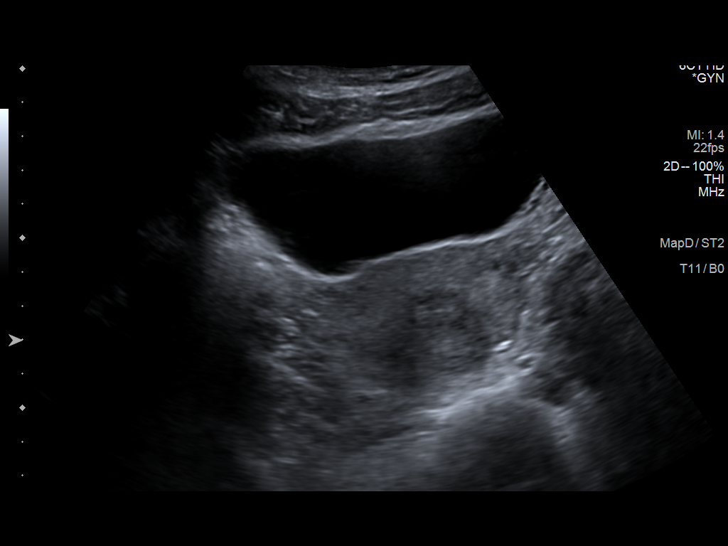
[im 41/104]
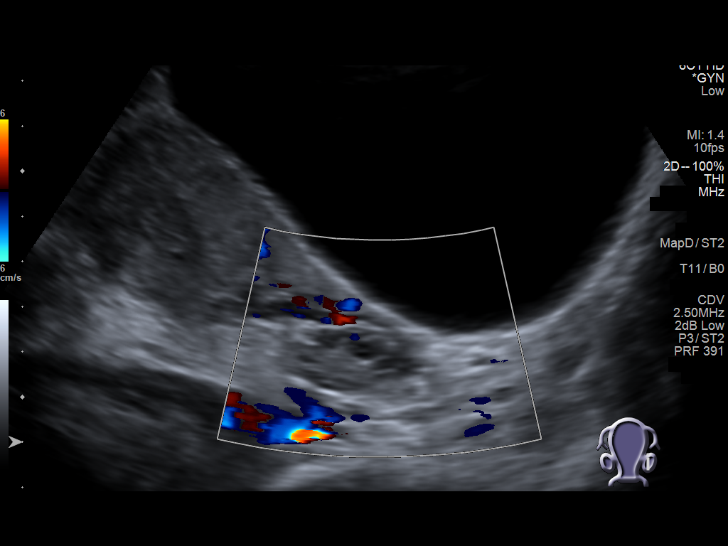
[im 50/104]
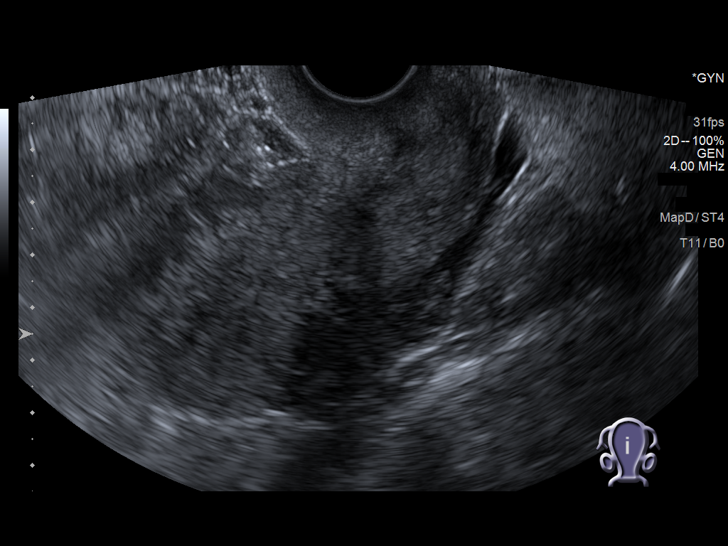
[im 59/104]
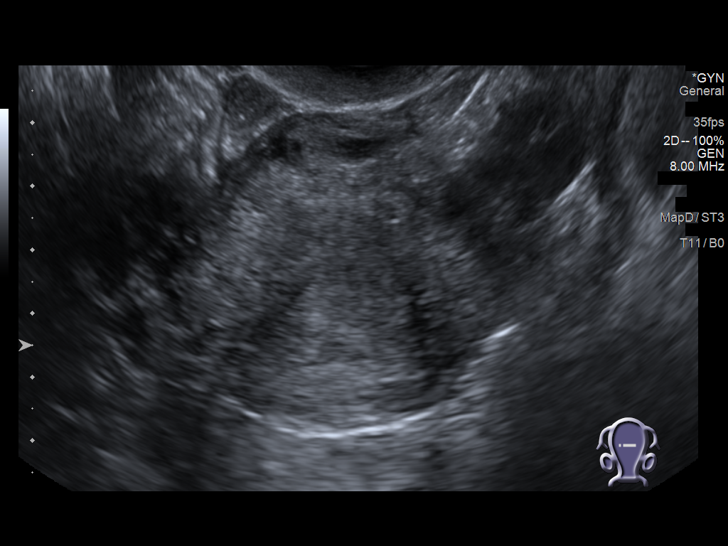
[im 68/104]
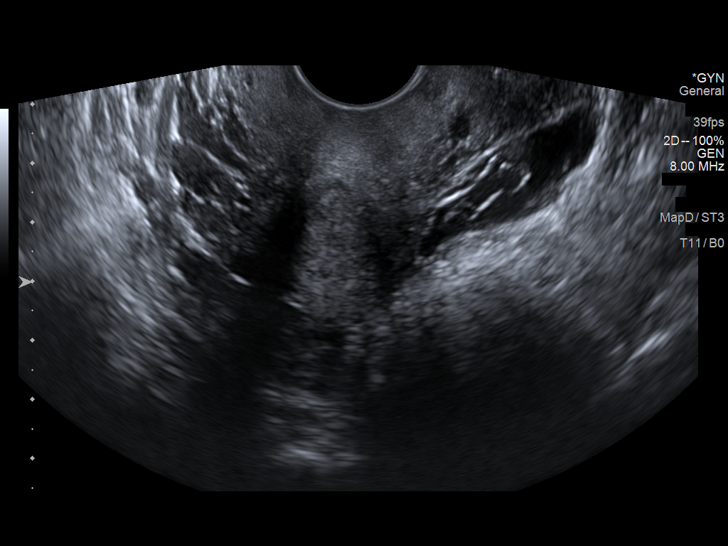
[im 77/104]
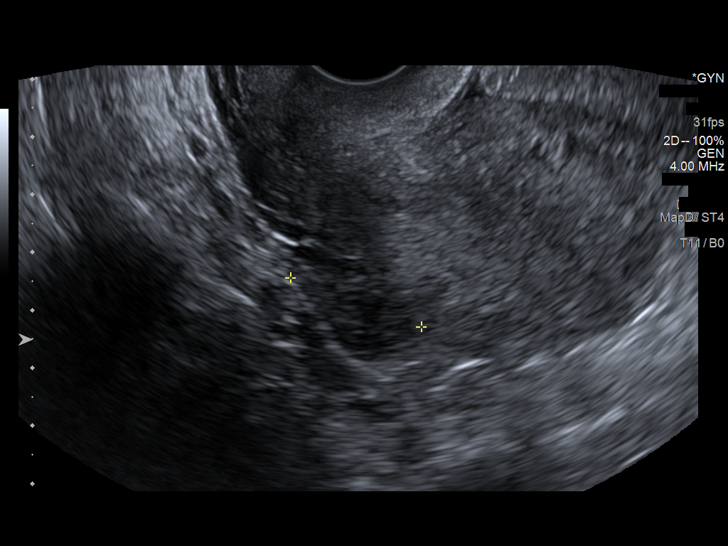
[im 86/104]
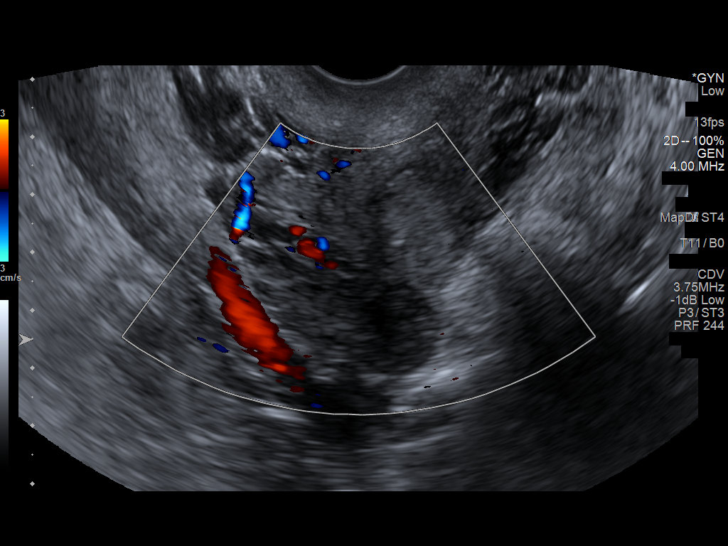
[im 95/104]
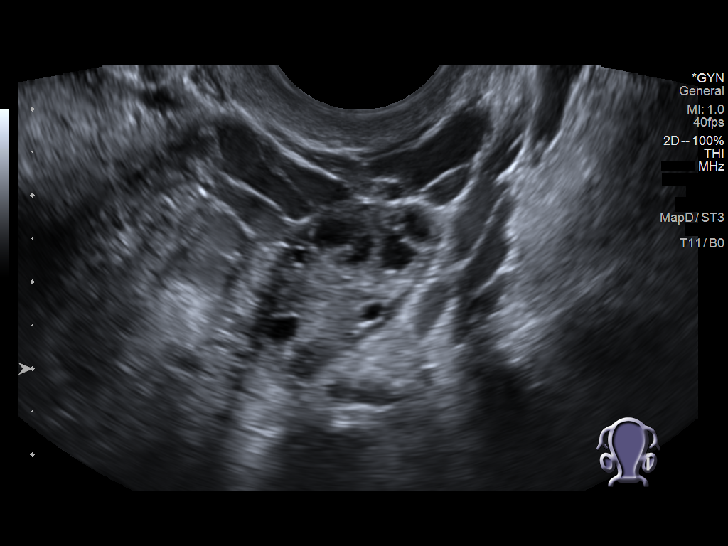
[im 104/104]
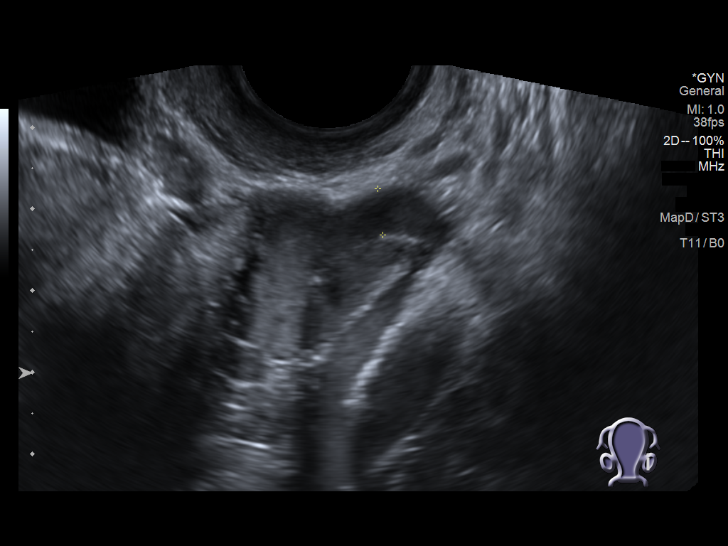

[13 of 25 positions shown; findings below may reference images not displayed]

FINDINGS: Uterus

Measurements: 8.8 x 4.1 x 4.8 cm = volume: 91 mL. Anteverted. Mildly
heterogeneous myometrium. No focal mass. Nabothian cyst at cervix.

Endometrium

Thickness: 13 mm. Mildly heterogeneous. No endometrial fluid or
definite focal abnormality.

Right ovary

Measurements: 3.6 x 1.5 x 2.4 cm = volume: 7 mL. Normal morphology
without mass

Left ovary

Measurements: 2.7 x 1.4 x 2.2 cm = volume: 4 mL. Normal morphology
without mass

Other findings

No free pelvic fluid. No adnexal masses. Numerous vascular
structures in the LEFT adnexa.
IMPRESSION: No significant abnormalities of the uterus, endometrium, or ovaries.

Incidentally noted numerous vascular structures in the LEFT adnexa,
nonspecific but can be seen with pelvic congestion syndrome.

## 2020-09-22 ENCOUNTER — Other Ambulatory Visit: Payer: Self-pay

## 2020-09-22 ENCOUNTER — Encounter: Payer: Self-pay | Admitting: Allergy

## 2020-09-22 ENCOUNTER — Telehealth: Payer: Self-pay

## 2020-09-22 ENCOUNTER — Ambulatory Visit (INDEPENDENT_AMBULATORY_CARE_PROVIDER_SITE_OTHER): Admitting: Allergy

## 2020-09-22 VITALS — BP 110/72 | HR 91 | Temp 98.4°F | Resp 16

## 2020-09-22 DIAGNOSIS — J3089 Other allergic rhinitis: Secondary | ICD-10-CM | POA: Diagnosis not present

## 2020-09-22 DIAGNOSIS — K2100 Gastro-esophageal reflux disease with esophagitis, without bleeding: Secondary | ICD-10-CM

## 2020-09-22 DIAGNOSIS — J4540 Moderate persistent asthma, uncomplicated: Secondary | ICD-10-CM | POA: Diagnosis not present

## 2020-09-22 DIAGNOSIS — T781XXD Other adverse food reactions, not elsewhere classified, subsequent encounter: Secondary | ICD-10-CM | POA: Diagnosis not present

## 2020-09-22 DIAGNOSIS — T7840XD Allergy, unspecified, subsequent encounter: Secondary | ICD-10-CM

## 2020-09-22 NOTE — Patient Instructions (Addendum)
Allergic reaction/Food allergy  Allergic rhinitis with OAS GERD Reactive airway, allergen driven   - Continue medications below:                A.  Dymista 1 spray each nostril twice a day or Flonase (if dymista unavailable) 1-2 sprays each nostril daily for nasal congestion/drainage.  Provided with a saline rinse kit to perform rinses prior to using medicated nasal sprays.  Also recommended using saline  rinse if she is outside to flush out pollens or other inhalants that will get into the nose.  She has been advised to use either distilled water or boiled water and bring down to room temperature for use.               B.  Albuterol inhaler 2 puffs every 4 hours as needed for cough, wheeze or shortness of breath    C.  Flovent 123mg 2 puffs twice a day                D.  Allegra 1833mdaily as needed and prior to immunotherapy injection               E.  Famotidine 20-40 mg daily    F.  Hydroxyzine 10 mg - 1-2 tablets up to 3 times a day as needed for allergic reaction    H.  Have access to your Epipen    I.   Use prednisone 1071m49m59m needed for allergic reaction management     J.  Omeprazole 20-40mg69mly for 2-week duration of time  -We have discussed possibility of having mast cell activation syndrome which can be a spectrum of mastocytosis.  With normal tryptase on multiple draws mastocytosis is much less likely however could be on the spectrum with continued allergic reaction symptoms.   Have advised to obtain 24hr urine studies if she develops symptoms to collect and see if she is having elevation of mast cell derivatives during these attacks.  She has collection jug. -We have also discussed trial of Cromolyn to take prior to meals.  Would start at 100 mg prior to meals 1-3 times a day.  Increase as necessary.  The goal with cromolyn is to provide more mast cell stabilization to help decrease risk of reaction.  Only comes in liquid formulation will ensure does not contain any  ingredients that she has reactions with -compound pharmacy can make hydroxyzine in capsule form at 5mg d50ms -will look into multivitamin options she has identified to see if ok to use.  Definitely agree with use of multivitamin to ensure that she is getting the recommended daily dose of central vitamins. -Agree with getting back in to see GI with worsening difficulty swallowing.  Advised to call since she should still be an established patient to get a follow-up appointment -Agree with nutritionist to help maximize her calorie intake nutrient rich foods that she can eat -Agree with counseling/therapy as this has been very stressful year and a half and it is very likely that increased stress can be exacerbating symptoms at this time -if she has a of future reaction where she presents to an emergency department or urgent care to assess for a tryptase draw. - [facility]  Bring your Epipen on days of your injections.   Continue antihistamine pre-medication regimen prior to allergy shot at this time (continue decreased dose of hydroxyzine 10mg).53m Continue to keep track of reactions and  note any triggers.   - has been evaluated by Dr. Lloyd Huger at [facility] Allergy/Immunology, Cardiology for palpitations.  ENT, GI and cardiology evaluations has been reassuring to-date.    follow-up in 1 month

## 2020-09-22 NOTE — Telephone Encounter (Signed)
-----   Message from [facility] Larose Hires, MD sent at 09/22/2020  1:47 PM EDT ----- Regarding: gastrocom (cromolyn) Can send this as a mychart or call pt.   Cromolyn sodium (also called Gastrocom in oral formulation)-  Each 5 mL ampule of GASTROCROM contains 100 mg cromolyn sodium in purified water.  These are the ingredients in this medication.  Cromolyn sodium is a hygroscopic, white powder having little odor. It may leave a slightly bitter aftertaste. GASTROCROM (cromolyn sodium, USP) Oral Concentrate is clear, colorless, and sterile.  GASTROCROM is indicated in the management of patients with mastocytosis/mast cell activation. Use of this product has been associated with improvement symptoms including diarrhea, flushing, headaches, vomiting, urticaria, abdominal pain, nausea, and itching in some patients.   Let me know if you want [place] to send in another prescription to see how this medication helps with symptom control.

## 2020-09-22 NOTE — Progress Notes (Signed)
Follow-up Note  RE: [name] [name] MRN: [id number] DOB: 07-Oct-1986 Date of Office Visit: 09/22/2020   History of present illness: [name] [name] is a 34 y.o. female presenting today for follow-up of allergic reaction, allergic rhinitis with OAS, GERD, reactive airway.  She was last seen in the office on 08/18/2020 by myself.  At that visit we did a rice challenge that she did pass.  She is fully vaccinated with Pfizer vaccine with second dose on 08/26/2020.  However since receiving this she has not been feeling well.  She states by the Saturday after she got her 2nd shot she felt bad.  The following week she states she didn't feel well all week.   The following week after that she states she had days where she would wax and wane with her symptoms.  She states she had gotten to point where she was taking her medications as needed as she was doing better.   But since getting the second vaccine dose states she was starting to having episodes where she felt like she was having difficulty breathing, throat tightening sensation where she felt like she couldn't swallow.  She states she she felt the need to take her antihistamine medications twice a day.  She states she has had busy week with some work duties, friend in town.  She also states her nanny has not been working over the past several months.  She states this has increased her work load at home. She states over the weekend she just felt exhausted and was not able to get out of bed and could not function.  By Tuesday of this week she felt like she needed to go to the ED and felt like there was impending doom.  She called her PCP who recommended she come in for blood work.  She states her hbgA1c has gone up and her hemoglobin is slightly lower.  Her vit d levels are also low.  At this time not on a multivitamin.  She has a sister with vonWillebrand and over the spring had an elevated factor 8 level.  She also reports having more difficulty swallowing even  with medications.  She is using her flovent more often at this time.  She does feel however that the allergy shots has helped with allergy symptom control especially nasal symptoms as has not been as congested even though she has been walking outside without mask on.  Currently taking allegra 183m and famotidine 474mdaily as well as omeprazole.  Hydroxyzine as needed but does feel better with a low dose hydroxyzine. Her diet has not changed.  She however [facility] she can feel itchy or have throat symptoms.  Her antihistamine premedication regimen does help minimize these symptoms.   Review of systems: Review of Systems  Constitutional: Positive for malaise/fatigue.  HENT: Negative.   Eyes: Negative.   Respiratory: Positive for shortness of breath.   Cardiovascular: Negative.   Gastrointestinal: Positive for nausea.  Musculoskeletal: Negative.   Skin: Positive for itching.  Neurological: Positive for weakness.    All other systems negative unless noted above in HPI  Past medical/social/surgical/family history have been reviewed and are unchanged unless specifically indicated below.  No changes  Medication List: Current Outpatient Medications  Medication Sig Dispense Refill  . albuterol (PROAIR HFA) 108 (90 Base) MCG/ACT inhaler Inhale 2 puffs into the lungs every 6 (six) hours as needed for wheezing or shortness of breath. With spacer 18  g 1  . cetirizine (ZYRTEC) 5 MG tablet Take 1 tablet (5 mg total) by mouth daily as needed. 90 tablet 1  . cromolyn (GASTROCROM) 100 MG/5ML solution Take 5 ml  three times daily prior to meals 600 mL 3  . EPINEPHrine 0.3 mg/0.3 mL IJ SOAJ injection Inject 0.3 mLs (0.3 mg total) into the muscle as needed for anaphylaxis. 2 each 1  . famotidine (PEPCID) 40 MG tablet Take 1 tablet (40 mg total) by mouth 2 (two) times daily. 60 tablet 5  . fexofenadine (ALLEGRA) 180 MG tablet Take 180 mg by mouth daily.    . Fluocinolone  Acetonide Scalp 0.01 % OIL Apply 1 application topically daily as needed (itching).     . fluticasone (FLONASE) 50 MCG/ACT nasal spray Place 1 spray into both nostrils as needed for allergies or rhinitis. 16 g 5  . fluticasone (FLOVENT HFA) 110 MCG/ACT inhaler Inhale 2 puffs into the lungs 2 (two) times daily as needed. (Patient taking differently: Inhale 2 puffs into the lungs 2 (two) times daily as needed (wheezing). ) 1 Inhaler 5  . hydrOXYzine (ATARAX/VISTARIL) 10 MG tablet Take 1-2 tablets three times a day as needed. (Patient taking differently: Take 10-20 mg by mouth 3 (three) times daily as needed for itching or anxiety. ) 90 tablet 1  . loratadine (CLARITIN) 10 MG tablet Take 1 tablet (10 mg total) by mouth daily as needed. 90 tablet 1  . nitrofurantoin, macrocrystal-monohydrate, (MACROBID) 100 MG capsule Take 100 mg by mouth See admin instructions. 1 tablet after intercourse    . omeprazole (PRILOSEC) 20 MG capsule Take 1 capsule (20 mg total) by mouth daily. (Patient taking differently: Take 20 mg by mouth daily as needed (heart burn). ) 90 capsule 1  . Selenium Sulfide 2.3 % SHAM Apply 1 application topically daily as needed. For dandruff    . Spacer/Aero-Holding Dorise Bullion Use as directed with albuterol hfa inhaler. 1 each 0   No current facility-administered medications for this visit.     Known medication allergies: Allergies  Allergen Reactions  . Diphenhydramine Other (See Comments)    Chest Pain Chest Pain Chest Pain  . Other     Tree nuts   . Peanut-Containing Drug Products Anaphylaxis  . Corn-Containing Products   . Soy Allergy   . Tobramycin Other (See Comments)    Caused dizziness  . Ketoconazole Rash     Physical examination: Blood pressure 110/72, pulse 91, temperature 98.4 F (36.9 C), temperature source Temporal, resp. rate 16, SpO2 98 %.  General: Alert, interactive, in no acute distress. HEENT: PERRLA, TMs pearly gray, turbinates moderately edematous  without discharge, post-pharynx non erythematous. Neck: Supple without lymphadenopathy. Lungs: Clear to auscultation without wheezing, rhonchi or rales. {no increased work of breathing. CV: Normal S1, S2 without murmurs. Abdomen: Nondistended, nontender. Skin: Warm and dry, without lesions or rashes. Extremities:  No clubbing, cyanosis or edema. Neuro:   Grossly intact.  Diagnositics/Labs: None today Have requested lab work done this week by PCP  Assessment and plan:   Allergic reaction/Food allergy  Allergic rhinitis with OAS GERD Reactive airway, allergen driven   - Continue medications below:                A.  Dymista 1 spray each nostril twice a day or Flonase (if dymista unavailable) 1-2 sprays each nostril daily for nasal congestion/drainage.  Provided with a saline rinse kit to perform rinses prior to using medicated nasal sprays.  Also recommended  using saline  rinse if she is outside to flush out pollens or other inhalants that will get into the nose.  She has been advised to use either distilled water or boiled water and bring down to room temperature for use.               B.  Albuterol inhaler 2 puffs every 4 hours as needed for cough, wheeze or shortness of breath    C.  Flovent 17mg 2 puffs twice a day                D.  Allegra 181mdaily as needed and prior to immunotherapy injection               E.  Famotidine 20-40 mg daily    F.  Hydroxyzine 10 mg - 1-2 tablets up to 3 times a day as needed for allergic reaction    H.  Have access to your Epipen    I.   Use prednisone 1061m40m42m needed for allergic reaction management     J.  Omeprazole 20-40mg69mly for 2-week duration of time  -We have discussed possibility of having mast cell activation syndrome which can be a spectrum of mastocytosis.  With normal tryptase on multiple draws mastocytosis is much less likely however could be on the spectrum with continued allergic reaction symptoms.   Have advised to  obtain 24hr urine studies if she develops symptoms to collect and see if she is having elevation of mast cell derivatives during these attacks.  She has collection jug. -We have also discussed trial of Cromolyn to take prior to meals.  Would start at 100 mg prior to meals 1-3 times a day.  Increase as necessary.  The goal with cromolyn is to provide more mast cell stabilization to help decrease risk of reaction.  Only comes in liquid formulation will ensure does not contain any ingredients that she has reactions with -compound pharmacy can make hydroxyzine in capsule form at 5mg d58ms -will look into multivitamin options she has identified to see if ok to use.  Definitely agree with use of multivitamin to ensure that she is getting the recommended daily dose of central vitamins. -Agree with getting back in to see GI with worsening difficulty swallowing.  Advised to call since she should still be an established patient to get a follow-up appointment -Agree with nutritionist to help maximize her calorie intake nutrient rich foods that she can eat -Agree with counseling/therapy as this has been very stressful year and a half and it is very likely that increased stress can be exacerbating symptoms at this time -if she has a of future reaction where she presents to an emergency department or urgent care to assess for a tryptase draw. - [facility]  Bring your Epipen on days of your injections.   Continue antihistamine pre-medication regimen prior to allergy shot at this time (continue decreased dose of hydroxyzine 10mg).47m Continue to keep track of reactions and note any triggers.   - has [facility] for palpitations.  ENT, GI and cardiology evaluations has been reassuring to-date.    follow-up in 1 month  I appreciate the opportunity to take part in [name] [name]'s care. Please do not hesitate to  contact me with questions.  Sincerely,   ShaylarPrudy Feelerlergy/Immunology [facility] [place]

## 2020-09-23 ENCOUNTER — Other Ambulatory Visit: Payer: Self-pay

## 2020-09-23 MED ORDER — CROMOLYN SODIUM 100 MG/5ML PO CONC: ORAL | 3 refills | Status: DC

## 2020-09-27 ENCOUNTER — Ambulatory Visit (INDEPENDENT_AMBULATORY_CARE_PROVIDER_SITE_OTHER)

## 2020-09-27 DIAGNOSIS — J309 Allergic rhinitis, unspecified: Secondary | ICD-10-CM

## 2020-09-30 ENCOUNTER — Telehealth: Payer: Self-pay | Admitting: *Deleted

## 2020-09-30 NOTE — Telephone Encounter (Signed)
Disability forms have been received and have been placed in Dr. Randell Patient box for her to review and complete.

## 2020-10-03 NOTE — Telephone Encounter (Signed)
Forms have been completed by Dr. Delorse Lek and have been faxed.

## 2020-10-07 ENCOUNTER — Ambulatory Visit (INDEPENDENT_AMBULATORY_CARE_PROVIDER_SITE_OTHER)

## 2020-10-07 ENCOUNTER — Telehealth: Payer: Self-pay

## 2020-10-07 DIAGNOSIS — J309 Allergic rhinitis, unspecified: Secondary | ICD-10-CM | POA: Diagnosis not present

## 2020-10-07 NOTE — Telephone Encounter (Signed)
Dr. Delorse Lek,   I wanted to see if you sent the [facility] the info they requested, also can you let the shot room know what my dose is today I'll be in for immunotherapy this afternoon- also thanks for taking good care of my little earlier this week- I appreciate it. Have a great weekend!  Yvone Neu

## 2020-10-10 NOTE — Telephone Encounter (Signed)
I think you may have sent this to the wrong provider. Thanks.

## 2020-10-11 NOTE — Telephone Encounter (Signed)
Please advise 

## 2020-10-11 NOTE — Telephone Encounter (Signed)
Please give red vial 0.4cc today.   Yes completed [place] forms over a week ago and had them faxed.  Did they not receive?

## 2020-10-18 ENCOUNTER — Ambulatory Visit (INDEPENDENT_AMBULATORY_CARE_PROVIDER_SITE_OTHER): Admitting: *Deleted

## 2020-10-18 ENCOUNTER — Telehealth: Payer: Self-pay | Admitting: Allergy

## 2020-10-18 DIAGNOSIS — J309 Allergic rhinitis, unspecified: Secondary | ICD-10-CM | POA: Diagnosis not present

## 2020-10-18 NOTE — Telephone Encounter (Signed)
Please advise 

## 2020-10-18 NOTE — Telephone Encounter (Signed)
Patient wanted to know if anything was found on a vitamin she could take that was discussed at her last office visit. Per notes, "will look into multivitamin options she has identified to see if ok to use."  Please advise.

## 2020-10-19 NOTE — Telephone Encounter (Signed)
Oh so sorry I did not get back with you regards to this after the visit.  I did look into both the options you provided and I think both would be appropriate choices.  I do not see a reason for either of those options to pose an issue.  Thus it would come down to cost and ease of getting the multivitamin continue.  Now that you are more than 6 weeks or so from the vaccine are you feeling better? Were you able to pick up the hydroxyzine from the compounding pharmacy? Were you able to start cromolyn and if so can you tell a difference in symptom control?

## 2020-10-20 NOTE — Telephone Encounter (Signed)
Called and left a voicemail asking for patient to return call to discuss.  °

## 2020-10-21 NOTE — Telephone Encounter (Signed)
Called and spoke with the patient and advised about the multivitamin, patient verbalized understanding. She stated that she felt a little better today than she has been feeling in regards to post vaccine but she did go see her PCP. She has not had a chance to pick up the medications yet because she had to go out of town but she will pick them up this weekend.

## 2020-10-27 ENCOUNTER — Encounter: Payer: Self-pay | Admitting: Allergy

## 2020-10-27 ENCOUNTER — Other Ambulatory Visit: Payer: Self-pay

## 2020-10-27 ENCOUNTER — Ambulatory Visit (INDEPENDENT_AMBULATORY_CARE_PROVIDER_SITE_OTHER): Admitting: Allergy

## 2020-10-27 VITALS — BP 118/80 | HR 79 | Temp 98.3°F | Resp 18 | Ht 61.0 in | Wt 134.6 lb

## 2020-10-27 DIAGNOSIS — T7840XD Allergy, unspecified, subsequent encounter: Secondary | ICD-10-CM | POA: Diagnosis not present

## 2020-10-27 DIAGNOSIS — T781XXD Other adverse food reactions, not elsewhere classified, subsequent encounter: Secondary | ICD-10-CM

## 2020-10-27 DIAGNOSIS — J3089 Other allergic rhinitis: Secondary | ICD-10-CM | POA: Diagnosis not present

## 2020-10-27 DIAGNOSIS — J4540 Moderate persistent asthma, uncomplicated: Secondary | ICD-10-CM

## 2020-10-27 DIAGNOSIS — K2100 Gastro-esophageal reflux disease with esophagitis, without bleeding: Secondary | ICD-10-CM

## 2020-10-27 NOTE — Progress Notes (Signed)
Follow-up Note  RE: Roshonda Sperl MRN: [id number] DOB: [birth year] Date of Office Visit: 10/27/2020   History of present illness: [name] [name] is a 34 y.o. female presenting today for follow-up of allergic reaction with food allergy, allergic rhinitis with OAS, GERD and reactive airway.  She was last seen in the office on 09/22/20 by myself.  Since this visit she did start to feel somewhat better after receiving covid vaccine.  However last week she had symptoms after having sonic ice.  She is anemic and thus has been wanting a lot of ice.  She reports the sonic ice is sprayed with something like lactic acid which has corn product in it.  She developed chest tightness, throat tightness, shortness of breath.  She states she hadn't taken any of her antihistamines which include Allegra and famotidine as she had been doing relatively well prior to this incident.  She did take Hydroxyzine at this time and did have some improvement.  She has not had any issues with [employer] ice however. She was able to pick up the cromolyn and did bring it to the office with her today.  She has not started this yet and was hopeful that we could start the dose here in the office to make sure she would tolerate use.  Review of systems: Review of Systems  Constitutional: Negative for chills and fever.  HENT:       See HPI  Eyes: Negative.   Respiratory:       See HPI  Cardiovascular: Negative.   Gastrointestinal: Negative.   Genitourinary: Negative.   Musculoskeletal: Negative.   Skin: Negative for itching and rash.  Neurological: Negative.     All other systems negative unless noted above in HPI  Past medical/social/surgical/family history have been reviewed and are unchanged unless specifically indicated below.  No changes  Medication List: Current Outpatient Medications  Medication Sig Dispense Refill  . albuterol (PROAIR HFA) 108 (90 Base) MCG/ACT inhaler Inhale 2 puffs into the lungs every 6  (six) hours as needed for wheezing or shortness of breath. With spacer 18 g 1  . cetirizine (ZYRTEC) 5 MG tablet Take 1 tablet (5 mg total) by mouth daily as needed. 90 tablet 1  . clobetasol ointment (TEMOVATE) 0.05 % Apply topically.    . cromolyn (GASTROCROM) 100 MG/5ML solution Take 5 ml  three times daily prior to meals 600 mL 3  . EPINEPHrine 0.3 mg/0.3 mL IJ SOAJ injection Inject 0.3 mLs (0.3 mg total) into the muscle as needed for anaphylaxis. 2 each 1  . famotidine (PEPCID) 40 MG tablet Take 1 tablet (40 mg total) by mouth 2 (two) times daily. 60 tablet 5  . fexofenadine (ALLEGRA) 180 MG tablet Take 180 mg by mouth daily.    . fluocinolone (SYNALAR) 0.01 % external solution Apply topically daily.    . Fluocinolone Acetonide Scalp 0.01 % OIL Apply 1 application topically daily as needed (itching).     . fluticasone (FLONASE) 50 MCG/ACT nasal spray Place 1 spray into both nostrils as needed for allergies or rhinitis. 16 g 5  . fluticasone (FLOVENT HFA) 110 MCG/ACT inhaler Inhale 2 puffs into the lungs 2 (two) times daily as needed. (Patient taking differently: Inhale 2 puffs into the lungs 2 (two) times daily as needed (wheezing). ) 1 Inhaler 5  . hydrOXYzine (ATARAX/VISTARIL) 10 MG tablet Take 1-2 tablets three times a day as needed. (Patient taking differently: Take 10-20 mg by mouth 3 (three) times daily as  needed for itching or anxiety. ) 90 tablet 1  . loratadine (CLARITIN) 10 MG tablet Take 1 tablet (10 mg total) by mouth daily as needed. 90 tablet 1  . nitrofurantoin, macrocrystal-monohydrate, (MACROBID) 100 MG capsule Take 100 mg by mouth See admin instructions. 1 tablet after intercourse    . omeprazole (PRILOSEC) 20 MG capsule Take 1 capsule (20 mg total) by mouth daily. (Patient taking differently: Take 20 mg by mouth daily as needed (heart burn). ) 90 capsule 1  . Selenium Sulfide 2.3 % SHAM Apply 1 application topically daily as needed. For dandruff    . Spacer/Aero-Holding  Dorise Bullion Use as directed with albuterol hfa inhaler. 1 each 0   No current facility-administered medications for this visit.     Known medication allergies: Allergies  Allergen Reactions  . Diphenhydramine Other (See Comments)    Chest Pain Chest Pain Chest Pain  . Other     Tree nuts   . Peanut-Containing Drug Products Anaphylaxis  . Corn-Containing Products   . Soy Allergy   . Tobramycin Other (See Comments)    Caused dizziness  . Ketoconazole Rash     Physical examination: Blood pressure 118/80, pulse 79, temperature 98.3 F (36.8 C), temperature source Temporal, resp. rate 18, height _0  (1.549 m), weight 134 lb 9.6 oz (61.1 kg), SpO2 97 %.  General: Alert, interactive, in no acute distress. HEENT: PERRLA, TMs pearly gray, turbinates minimally edematous without discharge, post-pharynx non erythematous. Neck: Supple without lymphadenopathy. Lungs: Clear to auscultation without wheezing, rhonchi or rales. {no increased work of breathing. CV: Normal S1, S2 without murmurs. Abdomen: Nondistended, nontender. Skin: Warm and dry, without lesions or rashes. Extremities:  No clubbing, cyanosis or edema. Neuro:   Grossly intact.  Diagnositics/Labs: Tolerated cromolyn 189m ingestion in office today  Assessment and plan: Patient Instructions  Allergic reaction/Food allergy  Allergic rhinitis with OAS GERD Reactive airway, allergen driven   - Continue medications below:                A.  Dymista 1 spray each nostril twice a day or Flonase (if dymista unavailable) 1-2 sprays each nostril daily for nasal congestion/drainage.  Provided with a saline rinse kit to perform rinses prior to using medicated nasal sprays.  Also recommended using saline  rinse if she is outside to flush out pollens or other inhalants that will get into the nose.  She has been advised to use either distilled water or boiled water and bring down to room temperature for use.               B.   Albuterol inhaler 2 puffs every 4 hours as needed for cough, wheeze or shortness of breath    C.  Flovent 1121m 2 puffs twice a day                D.  Allegra 18019maily as needed and prior to immunotherapy injection               E.  Famotidine 20-40 mg daily    F.  Hydroxyzine 5-10 mg -1-2 tablets up to 3 times a day as needed for allergic reaction    H.  Have access to your Epipen    I.   Use prednisone 4m89m0mg62mneeded for allergic reaction management     J.  Omeprazole 20-40mg 48my for 2-week duration of time  -We have discussed mast cell activation syndrome which can be a spectrum  of mastocytosis.  With normal tryptase on multiple draws mastocytosis is much less likely however could be on the spectrum with continued allergic reaction symptoms.   Have advised to obtain 24hr urine studies if she develops symptoms to collect and see if she is having elevation of mast cell derivatives during these attacks.  She has collection jug. -We have also discussed trial of Cromolyn to take prior to meals.  Dose provided in office today of 162m with observation period of 30 minutes.  She did take her yogurt shake as well for the meal.  She tolerated this and have advised she take prior to meals especially if it is a food she does not normally eat.   The goal with cromolyn is to provide more mast cell stabilization to help decrease risk of reaction.   -agree with starting multivitamin  -Agree with getting back in to see GI with worsening difficulty swallowing.  Advised to call since she should still be an established patient to get a follow-up appointment -Agree with nutritionist to help maximize her calorie intake nutrient rich foods that she can eat -Agree with counseling/therapy as this has been very stressful year and a half and it is very likely that increased stress can be exacerbating symptoms at this time -if she has a of future reaction where she presents to an emergency department or urgent  care to assess for a tryptase draw. - [facility]  Bring your Epipen on days of your injections.   Continue antihistamine pre-medication regimen prior to allergy shot at this time (continue decreased dose of hydroxyzine 136m.   - Continue to keep track of reactions and note any triggers.   - has been [facility] for palpitations.  ENT, GI and cardiology evaluations has been reassuring to-date.       No follow-ups on file.  I appreciate the opportunity to take part in Lashundra's care. Please do not hesitate to contact me with questions.  Sincerely,   ShPrudy FeelerMD Allergy/Immunology [facility] [place]

## 2020-10-27 NOTE — Patient Instructions (Addendum)
Allergic reaction/Food allergy  Allergic rhinitis with OAS GERD Reactive airway, allergen driven   - Continue medications below:                A.  Dymista 1 spray each nostril twice a day or Flonase (if dymista unavailable) 1-2 sprays each nostril daily for nasal congestion/drainage.  Provided with a saline rinse kit to perform rinses prior to using medicated nasal sprays.  Also recommended using saline  rinse if she is outside to flush out pollens or other inhalants that will get into the nose.  She has been advised to use either distilled water or boiled water and bring down to room temperature for use.               B.  Albuterol inhaler 2 puffs every 4 hours as needed for cough, wheeze or shortness of breath    C.  Flovent 177mg 2 puffs twice a day                D.  Allegra 1849mdaily as needed and prior to immunotherapy injection               E.  Famotidine 20-40 mg daily    F.  Hydroxyzine 5-10 mg -1-2 tablets up to 3 times a day as needed for allergic reaction    H.  Have access to your Epipen    I.   Use prednisone 1066m66m68m needed for allergic reaction management     J.  Omeprazole 20-40mg51mly for 2-week duration of time  -We have discussed mast cell activation syndrome which can be a spectrum of mastocytosis.  With normal tryptase on multiple draws mastocytosis is much less likely however could be on the spectrum with continued allergic reaction symptoms.   Have advised to obtain 24hr urine studies if she develops symptoms to collect and see if she is having elevation of mast cell derivatives during these attacks.  She has collection jug. -We have also discussed trial of Cromolyn to take prior to meals.  Dose provided in office today of 100mg 67m observation period of 30 minutes.  She did take her yogurt shake as well for the meal.  She tolerated this and have advised she take prior to meals especially if it is a food she does not normally eat.   The goal with cromolyn is to  provide more mast cell stabilization to help decrease risk of reaction.   -agree with starting multivitamin  -Agree with getting back in to see GI with worsening difficulty swallowing.  Advised to call since she should still be an established patient to get a follow-up appointment -Agree with nutritionist to help maximize her calorie intake nutrient rich foods that she can eat -Agree with counseling/therapy as this has been very stressful year and a half and it is very likely that increased stress can be exacerbating symptoms at this time -if she has a of future reaction where she presents to an emergency department or urgent care to assess for a tryptase draw. - [facility]  Bring your Epipen on days of your injections.   Continue antihistamine pre-medication regimen prior to allergy shot at this time (continue decreased dose of hydroxyzine 10mg).60m Continue to keep track of reactions and note any triggers.   - has been [facility] for palpitations.  ENT, GI and cardiology evaluations has been reassuring  to-date.

## 2020-10-28 ENCOUNTER — Ambulatory Visit (INDEPENDENT_AMBULATORY_CARE_PROVIDER_SITE_OTHER)

## 2020-10-28 DIAGNOSIS — J309 Allergic rhinitis, unspecified: Secondary | ICD-10-CM

## 2020-10-28 DIAGNOSIS — T7840XD Allergy, unspecified, subsequent encounter: Secondary | ICD-10-CM

## 2020-11-09 ENCOUNTER — Telehealth: Payer: Self-pay | Admitting: *Deleted

## 2020-11-09 ENCOUNTER — Ambulatory Visit (INDEPENDENT_AMBULATORY_CARE_PROVIDER_SITE_OTHER): Admitting: Allergy

## 2020-11-09 ENCOUNTER — Encounter: Payer: Self-pay | Admitting: Allergy

## 2020-11-09 ENCOUNTER — Other Ambulatory Visit: Payer: Self-pay

## 2020-11-09 VITALS — BP 110/72 | HR 81 | Temp 98.3°F | Resp 16

## 2020-11-09 DIAGNOSIS — J3089 Other allergic rhinitis: Secondary | ICD-10-CM

## 2020-11-09 DIAGNOSIS — T7840XD Allergy, unspecified, subsequent encounter: Secondary | ICD-10-CM | POA: Diagnosis not present

## 2020-11-09 DIAGNOSIS — J4540 Moderate persistent asthma, uncomplicated: Secondary | ICD-10-CM | POA: Diagnosis not present

## 2020-11-09 DIAGNOSIS — Z23 Encounter for immunization: Secondary | ICD-10-CM

## 2020-11-09 DIAGNOSIS — T781XXD Other adverse food reactions, not elsewhere classified, subsequent encounter: Secondary | ICD-10-CM | POA: Diagnosis not present

## 2020-11-09 DIAGNOSIS — K2100 Gastro-esophageal reflux disease with esophagitis, without bleeding: Secondary | ICD-10-CM

## 2020-11-09 NOTE — Progress Notes (Signed)
Follow-up Note  RE: [name] [name] MRN: [id number] DOB: [birth year] Date of Office Visit: 11/09/2020   History of present illness: [name] [name] is a 34 y.o. female presenting today for food challenge.  However she would like to change to a office visit to discuss past several weeks since last visit on 10/27/20.   The day she tried cromolyn in the office she just felt a bit tired afterwards.  When she started taking it a bit more consistently like twice a day prior to meals. She states it makes her tired and sleepy for the entire day into the next day.  She states she did try taking half the dose and did note that it had a benefit in regards to symptom control and made her less drowsy. She states egg nog caused bloating and abdominal cramping.  She is not sure if it is the egg that was contributing to the symptoms.  She also states she had food items like colic greens that she normally does not eat.  She does report eating dairy in items like yogurts and using dairy in products like her buckwheat flour without issue. Due to the cromolyn making her very sedated she did stop taking it and states since she has continued to have abdominal pain and bloating type symptoms with eating.  She also states sometimes when she eats foods her saliva can taste weird and nasty and she feels weird in general.  She states when she eats rice she feels tired and sometimes has diarrhea.  This was a food that we performed a food challenge for that was successfully passed. She does have follow-up with GI in January 2022. She does not want to perform food challenge today but would like to perform food skin testing today at this time.  Her last skin testing was done 10/2019 with positive results to soybean, peanuts and almond. She is interested in getting the egg free flu vaccine today which she tolerated at the visit. --------------- She returned to the office reporting symptoms of flushing, itching and abdominal cramping  at around 1:45-ish.  She received her preflu vaccine around 1030 this morning. she denies any throat tightening or respiratory symptoms.  She has not taken any medications.  She feels she may be reacting to the flu vaccine.  Vitals were stable.  Review of systems: Review of Systems  Constitutional: Positive for malaise/fatigue.  HENT: Negative.   Eyes: Negative.   Respiratory: Negative.   Cardiovascular: Negative.   Gastrointestinal: Positive for abdominal pain.  Musculoskeletal: Negative.   Skin: Positive for itching.  Neurological: Negative.     All other systems negative unless noted above in HPI  Past medical/social/surgical/family history have been reviewed and are unchanged unless specifically indicated below.  No changes  Medication List: Current Outpatient Medications  Medication Sig Dispense Refill  . albuterol (PROAIR HFA) 108 (90 Base) MCG/ACT inhaler Inhale 2 puffs into the lungs every 6 (six) hours as needed for wheezing or shortness of breath. With spacer 18 g 1  . cetirizine (ZYRTEC) 5 MG tablet Take 1 tablet (5 mg total) by mouth daily as needed. 90 tablet 1  . clobetasol ointment (TEMOVATE) 0.05 % Apply topically.    . cromolyn (GASTROCROM) 100 MG/5ML solution Take 5 ml  three times daily prior to meals 600 mL 3  . EPINEPHrine 0.3 mg/0.3 mL IJ SOAJ injection Inject 0.3 mLs (0.3 mg total) into the muscle as needed for anaphylaxis. 2 each 1  . famotidine (PEPCID)  40 MG tablet Take 1 tablet (40 mg total) by mouth 2 (two) times daily. 60 tablet 5  . fexofenadine (ALLEGRA) 180 MG tablet Take 180 mg by mouth daily.    . fluocinolone (SYNALAR) 0.01 % external solution Apply topically daily.    . Fluocinolone Acetonide Scalp 0.01 % OIL Apply 1 application topically daily as needed (itching).     . fluticasone (FLONASE) 50 MCG/ACT nasal spray Place 1 spray into both nostrils as needed for allergies or rhinitis. 16 g 5  . fluticasone (FLOVENT HFA) 110 MCG/ACT inhaler Inhale 2  puffs into the lungs 2 (two) times daily as needed. (Patient taking differently: Inhale 2 puffs into the lungs 2 (two) times daily as needed (wheezing). ) 1 Inhaler 5  . hydrOXYzine (ATARAX/VISTARIL) 10 MG tablet Take 1-2 tablets three times a day as needed. (Patient taking differently: Take 10-20 mg by mouth 3 (three) times daily as needed for itching or anxiety. ) 90 tablet 1  . loratadine (CLARITIN) 10 MG tablet Take 1 tablet (10 mg total) by mouth daily as needed. 90 tablet 1  . nitrofurantoin, macrocrystal-monohydrate, (MACROBID) 100 MG capsule Take 100 mg by mouth See admin instructions. 1 tablet after intercourse    . omeprazole (PRILOSEC) 20 MG capsule Take 1 capsule (20 mg total) by mouth daily. (Patient taking differently: Take 20 mg by mouth daily as needed (heart burn). ) 90 capsule 1  . Selenium Sulfide 2.3 % SHAM Apply 1 application topically daily as needed. For dandruff    . Spacer/Aero-Holding Dorise Bullion Use as directed with albuterol hfa inhaler. 1 each 0   No current facility-administered medications for this visit.     Known medication allergies: Allergies  Allergen Reactions  . Diphenhydramine Other (See Comments)    Chest Pain Chest Pain Chest Pain  . Other     Tree nuts   . Peanut-Containing Drug Products Anaphylaxis  . Corn-Containing Products   . Soy Allergy   . Tobramycin Other (See Comments)    Caused dizziness  . Ketoconazole Rash     Physical examination: Blood pressure 110/72, pulse 81, temperature 98.3 F (36.8 C), temperature source Temporal, resp. rate 16, SpO2 99 %.  General: Alert, interactive, in no acute distress. Abdomen: Nondistended Skin: Warm and dry, without lesions or rashes. Extremities:  No clubbing, cyanosis or edema. Neuro:   Grossly intact.  Diagnositics/Labs:  Allergy testing: Select food allergy skin prick testing is positive to soybean and green pea. Allergy testing results were read and interpreted by provider,  documented by clinical staff.  egg flu vaccine administered in the office without issue and patient tolerated prior to departure.   Assessment and plan:   Allergic reaction/Food allergy  Allergic rhinitis with OAS GERD Reactive airway, allergen driven   - Continue medications below:                A.  Dymista 1 spray each nostril twice a day or Flonase (if dymista unavailable) 1-2 sprays each nostril daily for nasal congestion/drainage.  Provided with a saline rinse kit to perform rinses prior to using medicated nasal sprays.  Also recommended using saline  rinse if she is outside to flush out pollens or other inhalants that will get into the nose.  She has been advised to use either distilled water or boiled water and bring down to room temperature for use.               B.  Albuterol inhaler 2 puffs  every 4 hours as needed for cough, wheeze or shortness of breath    C.  Flovent 133mg 2 puffs twice a day                D.  Allegra 1856mdaily as needed and prior to immunotherapy injection               E.  Famotidine 20-40 mg daily    F.  Hydroxyzine 5-10 mg -1-2 tablets up to 3 times a day as needed for allergic reaction    H.  Have access to your Epipen    I.   Use prednisone 1068m24m15m needed for allergic reaction management     J.  Omeprazole 20-40mg9mly for 2-week duration of time  -We have discussed mast cell activation syndrome which can be a spectrum of mastocytosis.  This can lead to recurrent episodes of symptoms that can include flushing, headaches, pruritus, urticaria, GI symptoms like abdominal cramping, nausea.  with normal tryptase on multiple draws mastocytosis is much less likely however could be on the spectrum with continued allergic reaction type symptoms.    -With active symptoms of feeling flushed, pruritus and abdominal cramping several hours after administration of the egg free flu vaccine we will go ahead and draw a tryptase level in the office as well as  start a 24-hour urine collection for histamine/prostaglandins.  However do not feel this is truly triggered by the flu vaccine given delayed onset. -We did discuss at her visit it would be helpful to also have a baseline urine study to be able to compare between episodes and baseline. -She does have a good symptom response to antihistamine use however we are limited in how much we can use due to palpitations. -We discussed today a referral to a mast cell activation syndrome/mastocytosis specialist -Use of cromolyn has been effective in reducing abdominal based symptoms with eating however it has made her extremely drowsy at the 100mg 51m.  She does report effectiveness at approximately half the dose with much less sedation.  Advised can continue to use as needed with half the dose going forward.   -agree with starting multivitamin  -GI follow-up has been scheduled for January 2022 -Agree with nutritionist to help maximize her calorie intake nutrient rich foods that she can eat -Agree with counseling/therapy as this has been very stressful year and a half and it is very likely that increased stress can be exacerbating symptoms - Continue allergen [facility] (allergy shots[facility].  Bring your Epipen on days of your injections.   Continue antihistamine pre-medication regimen prior to allergy shot at this time.  Obtaining environmental allergy panel with blood draw as below and will compare IgE levels now that she has an red vial maintenance. - Continue to keep track of reactions and note any triggers.   - has [facility] for palpitations.  ENT, GI and cardiology evaluations has been reassuring to-date.  -Skin testing today still shows reactivity to soybean and also reactivity to green pea.  Obtaining serum IgE levels to the negative items on skin testing to see if she has developed sensitivity.  Would like to continue to perform  in office food challenges to see if we can expand her diet more.  I appreciate the opportunity to take part in [name] [name]'s care. Please do not hesitate to contact me with questions.  Sincerely,   ShaylaPrudy Feelerllergy/Immunology [facility]

## 2020-11-09 NOTE — Patient Instructions (Addendum)
Allergic reaction/Food allergy  Allergic rhinitis with OAS GERD Reactive airway, allergen driven   - Continue medications below:                A.  Dymista 1 spray each nostril twice a day or Flonase (if dymista unavailable) 1-2 sprays each nostril daily for nasal congestion/drainage.  Provided with a saline rinse kit to perform rinses prior to using medicated nasal sprays.  Also recommended using saline  rinse if she is outside to flush out pollens or other inhalants that will get into the nose.  She has been advised to use either distilled water or boiled water and bring down to room temperature for use.               B.  Albuterol inhaler 2 puffs every 4 hours as needed for cough, wheeze or shortness of breath    C.  Flovent 161mg 2 puffs twice a day                D.  Allegra 1828mdaily as needed and prior to immunotherapy injection               E.  Famotidine 20-40 mg daily    F.  Hydroxyzine 5-10 mg -1-2 tablets up to 3 times a day as needed for allergic reaction    H.  Have access to your Epipen    I.   Use prednisone 1060m69m62m needed for allergic reaction management     J.  Omeprazole 20-40mg29mly for 2-week duration of time  -We have discussed mast cell activation syndrome which can be a spectrum of mastocytosis.  With normal tryptase on multiple draws mastocytosis is much less likely however could be on the spectrum with continued allergic reaction symptoms.    With active symptoms of feeling flushed, pruritus and abdominal cramping several hours after administration of the egg free flu vaccine we will go ahead and grab a tryptase level in the office as well as start a 24-hour urine collection for histamine/prostaglandins.   We did discuss at her visit it would be helpful to also have a baseline urine study to be able to compare between episodes and baseline. She does have a response to antihistamine use however we are limited in how much we can use due to palpitations. We  discussed today a referral to a mast cell activation syndrome/mastocytosis specialist -Use of cromolyn has been helpful/effective in reducing abdominal based symptoms with eating however it has made her extremely drowsy at the 100 mg dose.  She does report effectiveness at approximately half the dose with much less sedation.  Advised can continue to use as needed with half the dose going forward.   -agree with starting multivitamin  -GI follow-up has been scheduled for January 2022 -Agree with nutritionist to help maximize her calorie intake nutrient rich foods that she can eat -Agree with counseling/therapy as this has been very stressful year and a half and it is very likely that increased stress can be exacerbating symptoms - [facility]  Bring your Epipen on days of your injections.   Continue antihistamine pre-medication regimen prior to allergy shot at this time.  Obtaining environmental allergy panel with blood draw as below and will compare IgE levels now that she has an red vial maintenance. - Continue to keep track of reactions and note any triggers.   - [facility] for palpitations.  ENT, GI and cardiology evaluations has been reassuring to-date.  -Skin testing today still shows reactivity to soybean and also reactivity to green pea.  Obtaining serum IgE levels to the negative items on skin testing to see if she has developed sensitivity.  Would like to continue to perform in office food challenges to see if we can expand her diet more.    

## 2020-11-09 NOTE — Telephone Encounter (Signed)
Letter for flu vaccine.

## 2020-11-10 ENCOUNTER — Other Ambulatory Visit: Payer: Self-pay

## 2020-11-10 DIAGNOSIS — T7840XD Allergy, unspecified, subsequent encounter: Secondary | ICD-10-CM

## 2020-11-10 NOTE — Addendum Note (Signed)
Addended by: Dollene Cleveland R on: 11/10/2020 12:08 PM   Modules accepted: Orders

## 2020-11-11 LAB — TRYPTASE: Tryptase: 2.6 ug/L (ref 2.2–13.2)

## 2020-11-12 LAB — ALLERGENS W/TOTAL IGE AREA 2
Alternaria Alternata IgE: 0.68 kU/L — AB
Aspergillus Fumigatus IgE: 0.3 kU/L — AB
Bermuda Grass IgE: 0.25 kU/L — AB
Cat Dander IgE: 2.83 kU/L — AB
Cedar, Mountain IgE: <0.1 kU/L
Cladosporium Herbarum IgE: 0.14 kU/L — AB
Cockroach, German IgE: <0.1 kU/L
Common Silver Birch IgE: 3.43 kU/L — AB
Cottonwood IgE: <0.1 kU/L
D Farinae IgE: 0.13 kU/L — AB
D Pteronyssinus IgE: 0.11 kU/L — AB
Dog Dander IgE: 0.43 kU/L — AB
Elm, American IgE: 0.17 kU/L — AB
IgE (Immunoglobulin E), Serum: 80 IU/mL (ref 6–495)
Johnson Grass IgE: <0.1 kU/L
Maple/Box Elder IgE: 0.13 kU/L — AB
Mouse Urine IgE: <0.1 kU/L
Oak, White IgE: 3.79 kU/L — AB
Pecan, Hickory IgE: <0.1 kU/L
Penicillium Chrysogen IgE: <0.1 kU/L
Pigweed, Rough IgE: <0.1 kU/L
Ragweed, Short IgE: <0.1 kU/L
Sheep Sorrel IgE Qn: 0.17 kU/L — AB
Timothy Grass IgE: <0.1 kU/L
White Mulberry IgE: <0.1 kU/L

## 2020-11-12 LAB — ALLERGEN, SWEET POTATO,F54: Allergen Sweet Potato IgE: <0.1 kU/L

## 2020-11-12 LAB — ALLERGEN, HOPS/FRUIT CONE, IGE: Hops: <0.1 kU/L

## 2020-11-12 LAB — ALLERGENS(7)
Brazil Nut IgE: <0.1 kU/L
F020-IgE Almond: 0.11 kU/L — AB
F202-IgE Cashew Nut: <0.1 kU/L
Hazelnut (Filbert) IgE: 2.75 kU/L — AB
Peanut IgE: 0.18 kU/L — AB
Pecan Nut IgE: <0.1 kU/L
Walnut IgE: <0.1 kU/L

## 2020-11-12 LAB — ALLERGEN, WHEAT, F4: Wheat IgE: <0.1 kU/L

## 2020-11-12 LAB — ALLERGEN, MUSHROOM, RF212: Mushroom IgE: <0.1 kU/L

## 2020-11-12 LAB — ALLERGEN, PINEAPPLE, F210: Pineapple IgE: <0.1 kU/L

## 2020-11-12 LAB — ALLERGEN EGG WHITE F1: Egg White IgE: <0.1 kU/L

## 2020-11-12 LAB — ALLERGEN PROFILE, SHELLFISH
Clam IgE: <0.1 kU/L
F023-IgE Crab: <0.1 kU/L
F080-IgE Lobster: <0.1 kU/L
F290-IgE Oyster: <0.1 kU/L
Scallop IgE: <0.1 kU/L
Shrimp IgE: <0.1 kU/L

## 2020-11-12 LAB — ALLERGEN BANANA: Allergen Banana IgE: <0.1 kU/L

## 2020-11-12 LAB — ALLERGEN PROFILE, FOOD-FISH
Allergen Mackerel IgE: <0.1 kU/L
Allergen Salmon IgE: <0.1 kU/L
Allergen Trout IgE: <0.1 kU/L
Allergen Walley Pike IgE: <0.1 kU/L
Codfish IgE: <0.1 kU/L
Halibut IgE: <0.1 kU/L
Tuna: <0.1 kU/L

## 2020-11-12 LAB — ALLERGEN, RICE, F9: Allergen Rice IgE: <0.1 kU/L

## 2020-11-12 LAB — ALLERGEN SESAME F10: Sesame Seed IgE: <0.1 kU/L

## 2020-11-12 LAB — ALLERGEN,OAT,F7: Allergen Oat IgE: <0.1 kU/L

## 2020-11-12 LAB — ALLERGEN, TOMATO F25: Allergen Tomato, IgE: <0.1 kU/L

## 2020-11-12 LAB — ALLERGEN, RYE, F5: Rye IgE: <0.1 kU/L

## 2020-11-12 LAB — ALLERGEN, CORN F8: Allergen Corn, IgE: <0.1 kU/L

## 2020-11-12 LAB — ALLERGEN, APPLE F49: Allergen Apple, IgE: 0.36 kU/L — AB

## 2020-11-12 LAB — ALLERGEN, WHITE POTATO,F35: Allergen Potato, White IgE: <0.1 kU/L

## 2020-11-14 LAB — HISTAMINE, 24 HOUR URINE
Histamine,ug/24hr,U: 28 ug/24 hr (ref 0–65)
Histamine,ug/L,U: 43 ug/L

## 2020-11-15 ENCOUNTER — Telehealth: Payer: Self-pay

## 2020-11-15 NOTE — Telephone Encounter (Signed)
Forms from the [facility] have been completed and contacted patient to collect forms when she arrives to the office today.

## 2020-11-16 LAB — 5 HIAA, QUANTITATIVE, URINE, 24 HOUR
5-HIAA, Ur: 3.4 mg/L
5-HIAA,Quant.,24 Hr Urine: 2.2 mg/24 hr (ref 0.0–14.9)

## 2020-11-17 ENCOUNTER — Ambulatory Visit (INDEPENDENT_AMBULATORY_CARE_PROVIDER_SITE_OTHER)

## 2020-11-17 DIAGNOSIS — J309 Allergic rhinitis, unspecified: Secondary | ICD-10-CM

## 2020-11-17 DIAGNOSIS — T7840XD Allergy, unspecified, subsequent encounter: Secondary | ICD-10-CM

## 2020-11-23 LAB — PROSTAGLANDINS D2, URINE: Prostaglandins D2: 194 ng/L

## 2020-11-24 ENCOUNTER — Telehealth: Payer: Self-pay

## 2020-11-24 NOTE — Telephone Encounter (Signed)
Patient is requesting that all notes from 2021 from Dr. Delorse Lek be faxed over to the [employer] [facility] [employer] disability claims department to the fax number on file and patient exclaimed that all documents are on file.

## 2020-11-28 NOTE — Telephone Encounter (Signed)
Cree, The last request we have for records expired as of 09/20/2020. I would need a new request to be able to send records.  Thanks, [employer]

## 2020-11-29 NOTE — Telephone Encounter (Signed)
I called patient and notified her that the release of records form had to be updated. Patient verbally expressed understanding and requested that further communication be done via mychart.

## 2020-11-29 NOTE — Telephone Encounter (Signed)
See note from Candice.   Needs a records request.

## 2020-12-07 NOTE — Telephone Encounter (Signed)
New medical release was completed and put on the s-drive. Medical release department has been informed.

## 2020-12-15 ENCOUNTER — Ambulatory Visit (INDEPENDENT_AMBULATORY_CARE_PROVIDER_SITE_OTHER)

## 2020-12-15 DIAGNOSIS — J309 Allergic rhinitis, unspecified: Secondary | ICD-10-CM

## 2020-12-15 DIAGNOSIS — T7840XD Allergy, unspecified, subsequent encounter: Secondary | ICD-10-CM

## 2020-12-16 ENCOUNTER — Encounter: Payer: Self-pay | Admitting: Gastroenterology

## 2020-12-16 ENCOUNTER — Ambulatory Visit (INDEPENDENT_AMBULATORY_CARE_PROVIDER_SITE_OTHER): Admitting: Gastroenterology

## 2020-12-16 VITALS — BP 110/70 | HR 85 | Ht 61.0 in | Wt 135.6 lb

## 2020-12-16 DIAGNOSIS — R197 Diarrhea, unspecified: Secondary | ICD-10-CM

## 2020-12-16 DIAGNOSIS — R1310 Dysphagia, unspecified: Secondary | ICD-10-CM | POA: Diagnosis not present

## 2020-12-16 DIAGNOSIS — R1013 Epigastric pain: Secondary | ICD-10-CM

## 2020-12-16 NOTE — Patient Instructions (Addendum)
You have been scheduled for an endoscopy. Please follow written instructions given to you at your visit today. If you use inhalers (even only as needed), please bring them with you on the day of your procedure.  You may talk to your pharmacist regarding probiotics. Some probiotics include (look at ingredients for allergens before taking these): Align Florastor Culturelle  Thank you for choosing me and [facility].  Venita Lick. Pleas Koch., MD., Clementeen Ketter

## 2020-12-16 NOTE — Progress Notes (Signed)
    History of Present Illness: This is a 35 year old female who relates persistent difficulties swallowing solid foods.  The symptoms have been present for at least 2 years.  EGD in 02/2019 was normal with normal esophageal biopsies.  She relates that her allergist is concerned she may have developed eosinophilic esophagitis.  Patient relates occasional epigastric discomfort after eating certain foods.  She has occasional loose urgent stools again with certain foods.  If she avoids these foods the symptoms do not occur.  She continues to report multiple food allergies / reactions.  Her weight has increased from 120 pounds in August 20 20 to 135 pounds today.  Denies weight loss, constipation, change in stool caliber, melena, hematochezia, nausea, vomiting, reflux symptoms, chest pain.   Current Medications, Allergies, Past Medical History, Past Surgical History, Family History and Social History were reviewed in [employer] record.   Physical Exam: General: Well developed, well nourished, no acute distress Head: Normocephalic and atraumatic Eyes:  sclerae anicteric, EOMI Ears: Normal auditory acuity Mouth: Not examined, mask on during Covid-19 pandemic Lungs: Clear throughout to auscultation Heart: Regular rate and rhythm; no murmurs, rubs or bruits Abdomen: Soft, mild periumbilical tenderness and non distended. No masses, hepatosplenomegaly or hernias noted. Normal Bowel sounds Rectal: Not done Musculoskeletal: Symmetrical with no gross deformities  Pulses:  Normal pulses noted Extremities: No clubbing, cyanosis, edema or deformities noted Neurological: Alert oriented x 4, grossly nonfocal Psychological:  Alert and cooperative. Normal mood and affect   Assessment and Recommendations:  1.  Persistent solid food dysphagia, multiple food allergies / reactions.  Prior EGD in March 2020 was unremarkable including normal esophageal biopsies.  Given her persistent  dysphagia there is a concern that eosinophilic esophagitis or another esophageal disorder may have developed.  Repeat EGD with esophageal biopsies.  She continues to report a substantial soy allergy and she does not want propofol sedation.  She had moderate sedation with her prior EGD for the same reason and we will plan moderate sedation with this EGD.  The risks (including bleeding, perforation, infection, missed lesions, medication reactions and possible hospitalization or surgery if complications occur), benefits, and alternatives to endoscopy with possible biopsy and possible dilation were discussed with the patient and they consent to proceed.   2.  Occasional dyspepsia and occasional diarrhea that appear to be induced by dietary stressors. No further evaluation is needed at this time.  3. CRC screening, average risk.  Recommend routine screening colonoscopy at age 35.

## 2020-12-23 ENCOUNTER — Ambulatory Visit (INDEPENDENT_AMBULATORY_CARE_PROVIDER_SITE_OTHER): Admitting: *Deleted

## 2020-12-23 DIAGNOSIS — J309 Allergic rhinitis, unspecified: Secondary | ICD-10-CM

## 2020-12-29 ENCOUNTER — Ambulatory Visit (INDEPENDENT_AMBULATORY_CARE_PROVIDER_SITE_OTHER)

## 2020-12-29 DIAGNOSIS — J309 Allergic rhinitis, unspecified: Secondary | ICD-10-CM

## 2020-12-30 ENCOUNTER — Telehealth: Payer: Self-pay | Admitting: Gastroenterology

## 2020-12-30 ENCOUNTER — Encounter: Admitting: Gastroenterology

## 2020-12-30 NOTE — Telephone Encounter (Signed)
Called pt at 9:15 am to see if she was on her way for appointment. Pt stated she called last week was on hold or 40 minutes and than left a msg on MY CHART that she was wanting to cancel and reschedule. I will send a message for Scheduling to call her to reschedule appointment.

## 2021-03-21 ENCOUNTER — Ambulatory Visit: Payer: Self-pay | Admitting: *Deleted

## 2021-03-21 ENCOUNTER — Other Ambulatory Visit: Payer: Self-pay

## 2021-03-21 ENCOUNTER — Telehealth: Payer: Self-pay | Admitting: *Deleted

## 2021-03-21 DIAGNOSIS — J309 Allergic rhinitis, unspecified: Secondary | ICD-10-CM

## 2021-03-21 NOTE — Progress Notes (Signed)
Immunotherapy   Patient Details  Name: Deztiny Sarra MRN: [id number] Date of Birth: Mar 11, 1986  03/21/2021  [name] Mailed Out Green and Red vials of Mite-Mold, Pollen-Pet Following schedule: B  Frequency:1 time per week Epi-Pen:Epi-Pen Available  Consent signed and patient instructions given.  Patient's Green and Red vials have been mailed to [facility] at [address] [place], [place] [address] where she will continue her allergy injections under the care of Luella Cook, MD.    Apolonio Schneiders Fernandez-[place]on 03/21/2021, 1:20 PM

## 2021-03-21 NOTE — Telephone Encounter (Signed)
Received paperwork for patient to continue her allergy injections at [facility] in [place], [place]. Patient last received her last injection of January 2022. Spoke with Dr. Delorse Lek and she wants patient to back down to her Green vials and continue to Build Up on Schedule B. Called and informed patient of plan, patient verbalized understanding. Her Green and Red vials will be mailed to [facility] at [address] [place] [address].

## 2021-05-17 NOTE — Telephone Encounter (Signed)
Letter has been created and printed waiting for your signature

## 2021-08-09 ENCOUNTER — Telehealth: Payer: Self-pay | Admitting: *Deleted

## 2021-08-09 ENCOUNTER — Encounter: Payer: Self-pay | Admitting: *Deleted

## 2021-08-09 NOTE — Telephone Encounter (Signed)
Received fax from [facility] requesting new set of Red vials to be mailed to [address] [place] [address]. Phone number [phone] Fax [phone]. Called patient and left a voicemail asking for her to return call to receive consent to order next set of Red vials. Also sent MyChart message to patient in regards to ordering next vials and receiving consent. Records have been labeled and placed in bulk scanning.

## 2021-09-04 NOTE — Telephone Encounter (Signed)
Patient called asking about her vials and if they were mailed out yet since hers expired on 08/30/2021. Informed patient that Apolonio Schneiders called and left a message for her to call the office to give the okay to reorder new vials. She stated that she spoke with Apolonio Schneiders on the phone and gave her the okay to reorder, I informed patient that I do apologized that unfortunately her vials were not made. I informed patient that I would put her on the schedule for Monday to mail out her vials to  [facility] at [address] [place], [place] [address] where she will continue her allergy injections under the care of Luella Cook, MD. Patient verbalized understanding and appreciates getting her vials made and shipped.

## 2021-09-05 DIAGNOSIS — J301 Allergic rhinitis due to pollen: Secondary | ICD-10-CM | POA: Diagnosis not present

## 2021-09-05 NOTE — Progress Notes (Signed)
VIALS MADE. EXP 09-05-22 

## 2021-09-05 NOTE — Telephone Encounter (Signed)
Vials have been ordered.  °

## 2021-09-06 DIAGNOSIS — J3089 Other allergic rhinitis: Secondary | ICD-10-CM | POA: Diagnosis not present

## 2021-09-11 ENCOUNTER — Other Ambulatory Visit: Payer: Self-pay

## 2021-09-11 ENCOUNTER — Ambulatory Visit

## 2021-09-11 DIAGNOSIS — J309 Allergic rhinitis, unspecified: Secondary | ICD-10-CM

## 2021-09-11 NOTE — Progress Notes (Signed)
Immunotherapy   Patient Details  Name: [name] [name] MRN: [id number] Date of Birth: 06/05/1986  09/11/2021  [name] Red vials # 1 Mailed Out. One with Pollen-Pets and the other with DM-Mold with an expiration date of 09/05/2022. Following schedule: A Frequency:1 time per week Epi-Pen:Epi-Pen Available  Consent signed and patient instructions given. Patient's Red vials have been mailed to [facility] at [address] [place], [place] [address] where she will continue her allergy injections under the care of Luella Cook, MD.      Dub Mikes 09/11/2021, 9:09 AM

## 2021-10-09 ENCOUNTER — Telehealth: Payer: Self-pay

## 2021-10-09 NOTE — Telephone Encounter (Signed)
Dr. Osa Craver office called today to inform Dr. Delorse Lek that patient has been sick and that Dr. Obie Dredge prescribed antibiotics for patient and denied her of getting her injections until she is felling better. Antibiotic taper will last seven to ten days.  Nurse of Dr. Obie Dredge expressed that patient was last given 0.05 of her red vial on October 4,2022. According to her missed injection build up patient will need to back down to Green vials.Nurse called today to ask for Green vials to be shipped to [facility] at [address] [place], [place] [address] where she will continue her allergy injections under the care of Luella Cook, MD. Please advise. Thank you!

## 2021-10-10 NOTE — Telephone Encounter (Signed)
MyChart message sent to patient to see what the next steps can be since [facility] sent a MyChart message earlier today in regards to her vials.

## 2021-10-24 ENCOUNTER — Telehealth: Payer: Self-pay | Admitting: *Deleted

## 2021-10-24 ENCOUNTER — Ambulatory Visit: Admitting: *Deleted

## 2021-10-24 ENCOUNTER — Other Ambulatory Visit: Payer: Self-pay

## 2021-10-24 NOTE — Telephone Encounter (Signed)
Letter for [facility] for Flu Vaccine.

## 2022-12-21 LAB — LAB REPORT - SCANNED: EGFR: 97

## 2024-06-26 ENCOUNTER — Telehealth: Payer: Self-pay | Admitting: Allergy

## 2024-06-26 NOTE — Telephone Encounter (Signed)
 Called pt following [facility], scheduled appt to avoid lapse in PT status. PT asked that we send med records from 2017 to current to both [place] and herself (via [place]), and I advised would send over to our [facility] team

## 2024-07-02 ENCOUNTER — Ambulatory Visit (INDEPENDENT_AMBULATORY_CARE_PROVIDER_SITE_OTHER): Admitting: Allergy

## 2024-07-02 ENCOUNTER — Other Ambulatory Visit: Payer: Self-pay

## 2024-07-02 ENCOUNTER — Encounter: Payer: Self-pay | Admitting: Allergy

## 2024-07-02 VITALS — BP 90/70 | HR 91 | Temp 98.3°F | Resp 20 | Ht 62.0 in | Wt 136.5 lb

## 2024-07-02 DIAGNOSIS — K219 Gastro-esophageal reflux disease without esophagitis: Secondary | ICD-10-CM | POA: Diagnosis not present

## 2024-07-02 DIAGNOSIS — J4520 Mild intermittent asthma, uncomplicated: Secondary | ICD-10-CM

## 2024-07-02 DIAGNOSIS — J3089 Other allergic rhinitis: Secondary | ICD-10-CM

## 2024-07-02 DIAGNOSIS — Z888 Allergy status to other drugs, medicaments and biological substances status: Secondary | ICD-10-CM

## 2024-07-02 DIAGNOSIS — T781XXD Other adverse food reactions, not elsewhere classified, subsequent encounter: Secondary | ICD-10-CM | POA: Diagnosis not present

## 2024-07-02 DIAGNOSIS — T50905D Adverse effect of unspecified drugs, medicaments and biological substances, subsequent encounter: Secondary | ICD-10-CM

## 2024-07-02 NOTE — Patient Instructions (Signed)
Allergic reaction/Food allergy  Allergic rhinitis with OAS GERD Reactive airway, allergen driven   - Continue medications below:                A.  Dymista 1 spray each nostril twice a day or Flonase (if dymista unavailable) 1-2 sprays each nostril daily for nasal congestion/drainage.  Provided with a saline rinse kit to perform rinses prior to using medicated nasal sprays.  Also recommended using saline  rinse if she is outside to flush out pollens or other inhalants that will get into the nose.  She has been advised to use either distilled water or boiled water and bring down to room temperature for use.               B.  Albuterol inhaler 2 puffs every 4 hours as needed for cough, wheeze or shortness of breath    C.  Flovent 161mg 2 puffs twice a day                D.  Allegra 1828mdaily as needed and prior to immunotherapy injection               E.  Famotidine 20-40 mg daily    F.  Hydroxyzine 5-10 mg -1-2 tablets up to 3 times a day as needed for allergic reaction    H.  Have access to your Epipen    I.   Use prednisone [id number] needed for allergic reaction management     J.  Omeprazole 20-40mg29mly for 2-week duration of time  -We have discussed mast cell activation syndrome which can be a spectrum of mastocytosis.  With normal tryptase on multiple draws mastocytosis is much less likely however could be on the spectrum with continued allergic reaction symptoms.    With active symptoms of feeling flushed, pruritus and abdominal cramping several hours after administration of the egg free flu vaccine we will go ahead and grab a tryptase level in the office as well as start a 24-hour urine collection for histamine/prostaglandins.   We did discuss at her visit it would be helpful to also have a baseline urine study to be able to compare between episodes and baseline. She does have a response to antihistamine use however we are limited in how much we can use due to palpitations. We  discussed today a referral to a mast cell activation syndrome/mastocytosis specialist -Use of cromolyn has been helpful/effective in reducing abdominal based symptoms with eating however it has made her extremely drowsy at the 100 mg dose.  She does report effectiveness at approximately half the dose with much less sedation.  Advised can continue to use as needed with half the dose going forward.   -agree with starting multivitamin  -GI follow-up has been scheduled for January 2022 -Agree with nutritionist to help maximize her calorie intake nutrient rich foods that she can eat -Agree with counseling/therapy as this has been very stressful year and a half and it is very likely that increased stress can be exacerbating symptoms - [facility]  Bring your Epipen on days of your injections.   Continue antihistamine pre-medication regimen prior to allergy shot at this time.  Obtaining environmental allergy panel with blood draw as below and will compare IgE levels now that she has an red vial maintenance. - Continue to keep track of reactions and note any triggers.   - [facility] for palpitations.  ENT, GI and cardiology evaluations has been reassuring to-date.  -Skin testing today still shows reactivity to soybean and also reactivity to green pea.  Obtaining serum IgE levels to the negative items on skin testing to see if she has developed sensitivity.  Would like to continue to perform in office food challenges to see if we can expand her diet more.    

## 2024-07-02 NOTE — Progress Notes (Unsigned)
 Follow-up Note  RE: [name] [name] MRN: [id number] DOB: Sep 15, 1986 Date of Office Visit: 07/02/2024   History of present illness: [name] [name] is a 38 y.o. female presenting today for follow-up of allergic reaction with food allergy , allergic rhinitis with OAS, GERD and reactive airway.   She was last seen in the office on 11/09/2020 by myself. Discussed the use of AI scribe software for clinical note transcription with the patient, who gave verbal consent to proceed.  Since moving to [place], she has faced significant challenges in managing her allergies. Her new allergy  provider altered her allergy  shot formulation, which she feels is not tailored to her specific allergens.  She was started on allergy  shots through my office and when she moved we did transfer her vials there and they eventually took over making her allergy  vials.  She does believe she was making more progress when she was receiving the allergy  shots that I made.  She has struggled to progress with her shots due to the clinic's limited hours and frequent illnesses. After four years, she reached maintenance and tried Xolair  again with the local [facility] allergist, but experienced adverse reactions, including numbness on the right side of her body after the first injection and on the left side after the second injection, lasting up to five days. She has developed an intolerance to grains, including brown rice, white rice, and buckwheat.  These were foods that she previously had been able to tolerate.  She has had a 15-pound weight loss. She is currently on a Nutricate Splash drink and has tried various oral supplements without success. She describes a forced keto diet due to her inability to tolerate grains and mentions trying plantains, which she can tolerate in small amounts but experiences flushing consumed in larger quantities. She has delayed allergic reactions, with symptoms such as throat fullness and difficulty swallowing.   She has been to the dietitian however this was not very helpful with maximizing food she can eat her diet.  Her past medical history includes chronic sinusitis, with five episodes last year. She has been on various medications, including antihistamines Allegra, Singulair , Pepcid , hydroxyzine  and cromolyn , which she uses occasionally. She has also been on a proton pump inhibitor for four years and has experienced issues with gastroesophageal reflux disease. She did try Voquenza, but she had to stop due to side effects.  She also uses mometasone inhaler (this replace Flovent  when this became unavailable) and Flonase . She has a history of adverse reactions to Singulair  and has stopped using it.  She recently experienced a bee sting to her shoulder area, which caused local swelling and hives, but no systemic reaction. She has been managing her symptoms with Allegra and topical treatments.   Review of systems: 10pt ROS negative unless noted above in HPI  Past medical/social/surgical/family history have been reviewed and are unchanged unless specifically indicated below.  No changes  Medication List: Current Outpatient Medications  Medication Sig Dispense Refill   albuterol  (PROAIR  HFA) 108 (90 Base) MCG/ACT inhaler Inhale 2 puffs into the lungs every 6 (six) hours as needed for wheezing or shortness of breath. With spacer 18 g 1   azelastine (ASTELIN) 0.1 % nasal spray Place 2 sprays into the nose.     clobetasol ointment (TEMOVATE) 0.05 % Apply topically.     cromolyn  (GASTROCROM ) 100 MG/5ML solution Take 5 ml  three times daily prior to meals 600 mL 3   EPINEPHrine  0.3 mg/0.3 mL IJ SOAJ injection Inject 0.3  mLs (0.3 mg total) into the muscle as needed for anaphylaxis. 2 each 1   famotidine  (PEPCID ) 40 MG tablet Take 1 tablet (40 mg total) by mouth 2 (two) times daily. 60 tablet 5   fexofenadine (ALLEGRA) 180 MG tablet Take 180 mg by mouth daily.     fluocinolone (SYNALAR) 0.01 % external solution  Apply topically daily.     Fluocinolone Acetonide Scalp 0.01 % OIL Apply 1 application topically daily as needed (itching).      fluticasone  (FLONASE ) 50 MCG/ACT nasal spray Place 1 spray into both nostrils as needed for allergies or rhinitis. 16 g 5   fluticasone  (FLOVENT  HFA) 110 MCG/ACT inhaler Inhale 2 puffs into the lungs 2 (two) times daily as needed. (Patient taking differently: Inhale 2 puffs into the lungs 2 (two) times daily as needed (wheezing).) 1 Inhaler 5   hydrOXYzine  (ATARAX /VISTARIL ) 10 MG tablet Take 1-2 tablets three times a day as needed. (Patient taking differently: Take 5 mg by mouth 3 (three) times daily as needed for itching or anxiety.) 90 tablet 1   loratadine  (CLARITIN ) 10 MG tablet Take 1 tablet (10 mg total) by mouth daily as needed. 90 tablet 1   nitrofurantoin , macrocrystal-monohydrate, (MACROBID ) 100 MG capsule Take 100 mg by mouth See admin instructions. 1 tablet after intercourse as needed     Selenium Sulfide 2.3 % SHAM Apply 1 application topically daily as needed. For dandruff     Spacer/Aero-Holding Raguel FRENCH Use as directed with albuterol  hfa inhaler. 1 each 0   omeprazole  (PRILOSEC) 40 MG capsule Take 40 mg by mouth daily.     No current facility-administered medications for this visit.     Known medication allergies: Allergies  Allergen Reactions   Diphenhydramine  Other (See Comments)    Chest Pain Chest Pain Chest Pain   Other     Tree nuts    Peanut -Containing Drug Products Anaphylaxis   Diphenhydramine -Zinc Acetate Other (See Comments)   Lidocaine Other (See Comments)   Montelukast  Sodium Other (See Comments)   Corn-Containing Products    Mustard    Soy Allergy  (Obsolete)    Zyrtec  [Cetirizine ]     palpatations    Ketoconazole Rash   Tobramycin Other (See Comments)    Caused dizziness  Other reaction(s): Other (See Comments)  Caused dizziness  Caused dizziness  Other reaction(s): Other (See Comments), Other (See Comments), Other  (See Comments)    Other reaction(s): Other (See Comments)    Caused dizziness    Other Reaction(s): Other    Other reaction(s): Other (See Comments) Caused dizziness Caused dizziness  Caused dizziness  Other reaction(s): Other (See Comments), Other (See Comments), Other (See Comments) Other reaction(s): Other (See Comments) Caused dizziness Caused dizziness Caused dizziness Caused dizziness  Other reaction(s): Other (See Comments) Caused dizziness Caused dizziness    Caused dizziness    Other reaction(s): Other (See Comments), Other (See Comments), Other (See Comments) Other reaction(s): Other (See Comments) Caused dizziness Caused dizziness Caused dizziness Caused dizziness     Physical examination: Blood pressure 90/70, pulse 91, temperature 98.3 F (36.8 C), resp. rate 20, height 5' 2 (1.575 m), weight 136 lb 8 oz (61.9 kg), SpO2 98%.  General: Alert, interactive, in no acute distress. HEENT: PERRLA, TMs pearly gray, turbinates minimally edematous without discharge, post-pharynx non erythematous. Neck: Supple without lymphadenopathy. Lungs: Clear to auscultation without wheezing, rhonchi or rales. {no increased work of breathing. CV: Normal S1, S2 without murmurs. Abdomen: Nondistended, nontender. Skin: Warm and dry, without lesions  or rashes. Extremities:  No clubbing, cyanosis or edema. Neuro:   Grossly intact.  Diagnostics/Labs:  Spirometry: FEV1: 2.45L 100%, FVC: 2.81L 96%, ratio consistent with nonobstructive pattern  Assessment and plan: Allergic reaction/Food allergy   Allergic rhinitis with OAS GERD Reactive airway, allergen driven Adverse effect of medication   Current allergen immunotherapy does not seem to be quite as effective.  Discussed taking over immunotherapy again making her vials which would involve starting over.   - Review allergy  shot formulation and records to ensure appropriate allergens are included. - Continue Allegra daily, with the option  to increase to two tablets daily during exacerbations. 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 Previous mastocytosis workup normal. Symptoms managed with cromolyn  and antihistamines. - Start cromolyn  once daily with a meal, with potential to increase frequency and dosage based on tolerance and symptom control. - Continue Pepcid  40 mg for H2 blockade - Provided prednisone  taper over 12 days to dampen immune response - Will look into a trial of avapritinib which is the approved medication for systemic mastocytosis. - Will plan for skin testing for food allergies at the next visit. - Continue current food avoidance.  Will slowly introduce foods as tolerated back into the diet - Have access to epinephrine  device EpiPen  or Neffy , nasal spray device. - Hydroxyzine  as needed for exacerbations  Significant adverse reactions to Xolair , including numbness, led to discontinuation. - Do not continue Xolair . - Report adverse reaction to Xolair  to the drug company.  Adverse reaction to Singulair  - Avoid Singulair  in future treatment plans.  GERD symptoms managed with esomeprazole. Concerns about long-term PPI use. - Pepcid  as above   Asthma symptoms largely controlled with maintenance inhaler - Continue mometasone inhaler 1-2 times a day.  This is the replacement for Flovent  - Have access to albuterol  for relief of symptoms.  2 puffs every 4 hours as needed  Will review records once I have received from your local [facility] allergist Total of 50 minutes, greater than 50% of which was spent in discussion of treatment and management options.   I appreciate the opportunity to take part in [name] [name]'s care. Please do not hesitate to contact me with questions.  Sincerely,   Danita Brain, MD Allergy /Immunology [facility]

## 2024-07-06 NOTE — Addendum Note (Signed)
 Addended by: DANIEL NIVIA DEL on: 07/06/2024 04:51 PM   Modules accepted: Orders

## 2024-07-21 ENCOUNTER — Encounter: Payer: Self-pay | Admitting: Allergy

## 2024-07-24 MED ORDER — NEFFY 2 MG/0.1ML NA SOLN: 1.0000 | NASAL | 1 refills | Status: DC | PRN

## 2024-07-27 ENCOUNTER — Telehealth: Payer: Self-pay

## 2024-07-27 NOTE — Telephone Encounter (Signed)
*  AA  Pharmacy Patient Advocate Encounter   Received notification from CoverMyMeds that prior authorization for Neffy  2MG /0.1ML solution  is required/requested.   Insurance verification completed.   The patient is insured through [facility] .   Per test claim:  Epi-pen/Epinephrine  injectable is preferred by the insurance.  If suggested medication is appropriate, Please send in a new RX and discontinue this one. If not, please advise as to why it's not appropriate so that we may request a Prior Authorization. Please note, some preferred medications may still require a PA.  If the suggested medications have not been trialed and there are no contraindications to their use, the PA will not be submitted, as it will not be approved.   Key: [id number]

## 2024-08-11 ENCOUNTER — Other Ambulatory Visit (HOSPITAL_COMMUNITY): Payer: Self-pay

## 2024-08-11 ENCOUNTER — Encounter (HOSPITAL_COMMUNITY): Payer: Self-pay

## 2024-08-11 MED ORDER — EPINEPHRINE 0.3 MG/0.3ML IJ SOAJ
0.3000 mg | INTRAMUSCULAR | 1 refills | Status: AC | PRN
  Filled 2024-08-11: qty 2, 15d supply, fill #0

## 2024-08-11 NOTE — Addendum Note (Signed)
 Addended by: Jonathandavid Marlett E on: 08/11/2024 03:06 PM   Modules accepted: Orders

## 2024-08-12 ENCOUNTER — Encounter: Payer: Self-pay | Admitting: Allergy

## 2024-08-12 ENCOUNTER — Ambulatory Visit: Admitting: Allergy

## 2024-08-12 ENCOUNTER — Other Ambulatory Visit (HOSPITAL_COMMUNITY): Payer: Self-pay

## 2024-08-12 DIAGNOSIS — T781XXD Other adverse food reactions, not elsewhere classified, subsequent encounter: Secondary | ICD-10-CM | POA: Diagnosis not present

## 2024-08-12 DIAGNOSIS — J3089 Other allergic rhinitis: Secondary | ICD-10-CM

## 2024-08-12 MED ORDER — CROMOLYN SODIUM 100 MG/5ML PO CONC: 200.0000 mg | Freq: Three times a day (TID) | ORAL | 3 refills | Status: DC

## 2024-08-12 NOTE — Progress Notes (Signed)
 Follow-up Note  RE: [name] [name] MRN: [id number] DOB: 1986/12/01 Date of Office Visit: 08/12/2024   History of present illness: [name] [name] is a 38 y.o. female presenting today for skin testing visit.  She was last seen in the office on 07/02/24.  She is in her usual state of health today without recent illness.  She has held antihistamines for at least 3 days for testing today. She presents today with her husband.   Medication List: Current Outpatient Medications  Medication Sig Dispense Refill   albuterol  (PROAIR  HFA) 108 (90 Base) MCG/ACT inhaler Inhale 2 puffs into the lungs every 6 (six) hours as needed for wheezing or shortness of breath. With spacer 18 g 1   azelastine (ASTELIN) 0.1 % nasal spray Place 2 sprays into the nose.     clobetasol ointment (TEMOVATE) 0.05 % Apply topically.     cromolyn  (GASTROCROM ) 100 MG/5ML solution Take 5 ml  three times daily prior to meals 600 mL 3   EPINEPHrine  0.3 mg/0.3 mL IJ SOAJ injection Inject 0.3 mg into the muscle as needed for anaphylaxis. 2 each 1   famotidine  (PEPCID ) 40 MG tablet Take 1 tablet (40 mg total) by mouth 2 (two) times daily. 60 tablet 5   fexofenadine (ALLEGRA) 180 MG tablet Take 180 mg by mouth daily.     fluocinolone (SYNALAR) 0.01 % external solution Apply topically daily.     Fluocinolone Acetonide Scalp 0.01 % OIL Apply 1 application topically daily as needed (itching).      fluticasone  (FLONASE ) 50 MCG/ACT nasal spray Place 1 spray into both nostrils as needed for allergies or rhinitis. 16 g 5   fluticasone  (FLOVENT  HFA) 110 MCG/ACT inhaler Inhale 2 puffs into the lungs 2 (two) times daily as needed. (Patient taking differently: Inhale 2 puffs into the lungs 2 (two) times daily as needed (wheezing).) 1 Inhaler 5   hydrOXYzine  (ATARAX /VISTARIL ) 10 MG tablet Take 1-2 tablets three times a day as needed. (Patient taking differently: Take 5 mg by mouth 3 (three) times daily as needed for itching or anxiety.) 90 tablet 1    loratadine  (CLARITIN ) 10 MG tablet Take 1 tablet (10 mg total) by mouth daily as needed. 90 tablet 1   nitrofurantoin , macrocrystal-monohydrate, (MACROBID ) 100 MG capsule Take 100 mg by mouth See admin instructions. 1 tablet after intercourse as needed     omeprazole  (PRILOSEC) 40 MG capsule Take 40 mg by mouth daily.     Selenium Sulfide 2.3 % SHAM Apply 1 application topically daily as needed. For dandruff     Spacer/Aero-Holding Raguel FRENCH Use as directed with albuterol  hfa inhaler. 1 each 0   No current facility-administered medications for this visit.     Known medication allergies: Allergies  Allergen Reactions   Diphenhydramine  Other (See Comments)    Chest Pain Chest Pain Chest Pain   Other     Tree nuts    Peanut -Containing Drug Products Anaphylaxis   Diphenhydramine -Zinc Acetate Other (See Comments)   Lidocaine Other (See Comments)   Montelukast  Sodium Other (See Comments)   Corn-Containing Products    Mustard    Soy Allergy  (Obsolete)    Zyrtec  [Cetirizine ]     palpatations    Ketoconazole Rash   Tobramycin Other (See Comments)    Caused dizziness  Other reaction(s): Other (See Comments)  Caused dizziness  Caused dizziness  Other reaction(s): Other (See Comments), Other (See Comments), Other (See Comments)    Other reaction(s): Other (See Comments)  Caused dizziness    Other Reaction(s): Other    Other reaction(s): Other (See Comments) Caused dizziness Caused dizziness  Caused dizziness  Other reaction(s): Other (See Comments), Other (See Comments), Other (See Comments) Other reaction(s): Other (See Comments) Caused dizziness Caused dizziness Caused dizziness Caused dizziness  Other reaction(s): Other (See Comments) Caused dizziness Caused dizziness    Caused dizziness    Other reaction(s): Other (See Comments), Other (See Comments), Other (See Comments) Other reaction(s): Other (See Comments) Caused dizziness Caused dizziness Caused dizziness Caused  dizziness   Diagnostics/Labs:  Allergy  testing:   Airborne Adult Perc - 08/12/24 0839     Time Antigen Placed 9160    Allergen Manufacturer Jestine    Location Back    Number of Test 55    1. Control-Buffer 50% Glycerol Negative    2. Control-Histamine 2+    3. Bahia Negative    4. [place] 2+    5. Johnson Negative    6. [place]  Blue Negative    7. Meadow Fescue 2+    8. Perennial Rye 2+    9. Timothy Negative    10. Ragweed Mix 2+    11. Cocklebur 2+    12. Plantain,  English 2+    13. Baccharis 2+    14. Dog Fennel Negative    15. Guernsey Thistle 2+    16. Lamb's Quarters 2+    17. Sheep Sorrell 2+    18. Rough Pigweed 2+    19. Marsh Elder, Rough Negative    20. Mugwort, Common Negative    21. Box, Elder 3+    22. Cedar, red 2+    23. Sweet Gum 2+    24. Pecan Pollen 2+    25. Pine Mix 2+    26. Walnut, Black Pollen 2+    27. Red Mulberry 2+    28. Ash Mix 2+    29. Birch Mix 3+    30. Beech American Negative    31. Cottonwood, Guinea-Bissau Negative    32. Hickory, White Negative    33. Maple Mix Negative    34. Oak, [place] Mix 2+    35. Sycamore Eastern 2+    36. Alternaria Alternata 2+    37. Cladosporium Herbarum Negative    38. Aspergillus Mix 2+    39. Penicillium Mix 2+    40. Bipolaris Sorokiniana (Helminthosporium) 3+    41. Drechslera Spicifera (Curvularia) 3+    42. Mucor Plumbeus 2+    43. Fusarium Moniliforme 2+    44. Aureobasidium Pullulans (pullulara) 2+    45. Rhizopus Oryzae Negative    46. Botrytis Cinera Negative    47. Epicoccum Nigrum 2+    48. Phoma Betae 2+    49. Dust Mite Mix 2+    50. Cat Hair 10,000 BAU/ml 2+    51.  Dog Epithelia 2+    52. Mixed Feathers Negative    53. Horse Epithelia Negative    54. Cockroach, German 2+    55. Tobacco Leaf Negative          Food Adult Perc - 08/12/24 0900     Time Antigen Placed 9095    Allergen Manufacturer Jestine    Location Back    Number of allergen test 27    1. Peanut  2+     2. Soybean 2+    3. Wheat Negative    4. Sesame Negative    5. Milk, Cow Negative    7. Egg  White, Chicken Negative    10. Cashew 2+    11. Walnut Food Negative    12. Almond Negative    13. Hazelnut 2+    14. Pecan Food Negative    15. Pistachio Negative    20. Salmon Negative    28. Oat  2+    29. Rice Negative    30. Barley Negative    31. Rye  Negative    34. Chicken Meat Negative    38. Tomato Negative    39. White Potato Negative    40. Sweet Potato Negative    46. Mushrooms Negative    52. Corn 2+    58. Apple Negative    60. Strawberry Negative    66. Chocolate/Cacao Bean Negative    72. Mustard 2+          Allergy  testing results were read and interpreted by provider, documented by clinical staff.   Assessment and plan: Allergic reaction/Food allergy   Allergic rhinitis with OAS GERD Reactive airway, allergen driven Adverse effect of medication  - Environmental allergy  testing today showed: grasses, ragweed, weeds, trees, indoor molds, outdoor molds, dust mites, cat, dog, and cockroach - Food allergy  testing today showed:peanut , soybean, cashew, hazelnut, oat, corn, mustard - Copy of test results provided.  - Avoidance measures provided.  - Will plan to restart allergy  immunotherapy with updated testing.  Schedule new start appointment with us  and then will have you take vials back to [facility] allergist for continuation.  - Received/reviewed outside allergy  shot formulation and records - Continue Allegra daily, with the option to increase to two tablets daily during exacerbations. 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 Previous mastocytosis workup normal. Symptoms managed with cromolyn  and antihistamines. - Continue cromolyn  once daily with a meal, with potential to increase frequency and dosage based on tolerance and  symptom control. - Continue Pepcid  40 mg for H2 blockade - Continue current food avoidance.  Will slowly introduce foods as tolerated back into the diet - Have access to epinephrine  device EpiPen  or Neffy , nasal spray device. - Hydroxyzine  as needed for exacerbations - Agree with getting tryptase level during flare symptoms  Significant adverse reactions to Xolair , including numbness, led to discontinuation. - Do not continue Xolair .  Adverse reaction to Singulair  - Avoid Singulair  in future treatment plans.  Think about trialing lower dose 5mg  if possible.   GERD symptoms managed with esomeprazole. Concerns about long-term PPI use. - Pepcid  as above   Asthma symptoms largely controlled with maintenance inhaler - Continue mometasone inhaler 1-2 times a day.  This is the replacement for Flovent  - Have access to albuterol  for relief of symptoms.  2 puffs every 4 hours as needed   I appreciate the opportunity to take part in Deva's care. Please do not hesitate to contact me with questions.  Sincerely,   Danita Brain, MD Allergy /Immunology [facility]

## 2024-08-12 NOTE — Patient Instructions (Addendum)
 Allergic reaction/Food allergy   Allergic rhinitis with OAS GERD Reactive airway, allergen driven Adverse effect of medication  - Environmental allergy  testing today showed: grasses, ragweed, weeds, trees, indoor molds, outdoor molds, dust mites, cat, dog, and cockroach - Food allergy  testing today showed:peanut , soybean, cashew, hazelnut, oat, corn, mustard - Copy of test results provided.  - Avoidance measures provided.  - Will plan to restart allergy  immunotherapy with updated testing.  Schedule new start appointment with us  and then will have you take vials back to [facility] allergist for continuation.  - Received/reviewed outside allergy  shot formulation and records - Continue Allegra daily, with the option to increase to two tablets daily during exacerbations. 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 Previous mastocytosis workup normal. Symptoms managed with cromolyn  and antihistamines. - Continue cromolyn  once daily with a meal, with potential to increase frequency and dosage based on tolerance and symptom control. - Continue Pepcid  40 mg for H2 blockade - Continue current food avoidance.  Will slowly introduce foods as tolerated back into the diet - Have access to epinephrine  device EpiPen  or Neffy , nasal spray device. - Hydroxyzine  as needed for exacerbations - Agree with getting tryptase level during flare symptoms  Significant adverse reactions to Xolair , including numbness, led to discontinuation. - Do not continue Xolair .  Adverse reaction to Singulair  - Avoid Singulair  in future treatment plans.  Think about trialing lower dose 5mg  if possible.   GERD symptoms managed with esomeprazole. Concerns about long-term PPI use. - Pepcid  as above   Asthma symptoms largely controlled with maintenance inhaler - Continue mometasone inhaler 1-2  times a day.  This is the replacement for Flovent  - Have access to albuterol  for relief of symptoms.  2 puffs every 4 hours as needed

## 2024-08-19 ENCOUNTER — Ambulatory Visit: Admitting: Allergy

## 2024-08-19 ENCOUNTER — Encounter: Payer: Self-pay | Admitting: Allergy

## 2024-08-20 ENCOUNTER — Ambulatory Visit: Admitting: Allergy

## 2024-08-21 ENCOUNTER — Other Ambulatory Visit (HOSPITAL_COMMUNITY): Payer: Self-pay

## 2024-09-01 ENCOUNTER — Encounter: Payer: Self-pay | Admitting: Allergy

## 2024-09-01 ENCOUNTER — Telehealth: Payer: Self-pay

## 2024-09-01 NOTE — Telephone Encounter (Signed)
 Patient called in upset asking why has my allergy  injections not been ordered? She states that she was told during her office visit with Dr. Jeneal on 9/3 that her vials would be made an all she would need to do was get the first injection and take the vials with her. I did inform her that that is correct but an appointment would need to be made first to ensure that the vials are made and in office, 3 weeks out. She was not pleased with that answer and stated that she would file a grievance since this impeding patient care. She was offered dates prior to today via MyChart but no dates were every confirmed.    Dr. Jeneal has been made aware and Rx will be submitted for [facility]. Appointment is made for 10/14 at 9a.

## 2024-09-02 ENCOUNTER — Other Ambulatory Visit: Payer: Self-pay | Admitting: Allergy

## 2024-09-02 DIAGNOSIS — J3089 Other allergic rhinitis: Secondary | ICD-10-CM

## 2024-09-03 ENCOUNTER — Telehealth: Payer: Self-pay

## 2024-09-03 ENCOUNTER — Encounter: Payer: Self-pay | Admitting: Allergy

## 2024-09-03 DIAGNOSIS — J3081 Allergic rhinitis due to animal (cat) (dog) hair and dander: Secondary | ICD-10-CM | POA: Diagnosis not present

## 2024-09-03 DIAGNOSIS — J3089 Other allergic rhinitis: Secondary | ICD-10-CM | POA: Diagnosis not present

## 2024-09-03 DIAGNOSIS — J302 Other seasonal allergic rhinitis: Secondary | ICD-10-CM | POA: Diagnosis not present

## 2024-09-03 DIAGNOSIS — J301 Allergic rhinitis due to pollen: Secondary | ICD-10-CM | POA: Diagnosis not present

## 2024-09-03 NOTE — Progress Notes (Signed)
 Aeroallergen Immunotherapy   Ordering Provider: Dr. Danita Brain   Patient Details  Name: [name] [name]  MRN: [id number]  Date of Birth: 06-29-1986   Order 2 of 2   Vial Label: mold, mite, roach   0.2 ml (Volume)  1:20 Concentration -- Alternaria alternata  0.2 ml (Volume)  1:10 Concentration -- Aspergillus mix  0.2 ml (Volume)  1:10 Concentration -- Penicillium mix  0.2 ml (Volume)  1:20 Concentration -- Bipolaris sorokiniana  0.2 ml (Volume)  1:20 Concentration -- Drechslera spicifera  0.2 ml (Volume)  1:10 Concentration -- Mucor plumbeus  0.2 ml (Volume)  1:10 Concentration -- Fusarium moniliforme  0.2 ml (Volume)  1:40 Concentration -- Aureobasidium pullulans  0.2 ml (Volume)  1:40 Concentration -- Epicoccum nigrum  0.2 ml (Volume)  1:40 Concentration -- Phoma betae  0.3 ml (Volume)  1:20 Concentration -- Cockroach, German  0.5 ml (Volume)   AU Concentration -- Mite Mix (DF 5,000 & DP 5,000)    2.8  ml Extract Subtotal  2.2  ml Diluent   5.0  ml Maintenance Total   Schedule:  A  Silver Vial (1:1,000,000): Schedule A (10 doses)  Blue Vial (1:100,000): Schedule A (10 doses)  Yellow Vial (1:10,000): Schedule A (10 doses)  Green Vial (1:1,000): Schedule A (10 doses)  Red Vial (1:100): Schedule A (10 doses)   Special Instructions: please start pt in silver vial schedule A.   After completion of the first Red Vial, please space to every two weeks. After completion of the second Red Vial, please space to every 4 weeks. Ok to up dose new vials at 0.38mL --> 0.3 mL --> 0.5 mL.  Ok to come twice weekly, if desired, as long as there is 48 hours between injections.  Pt will continue to receive shots at outside allergist in [place]

## 2024-09-03 NOTE — Progress Notes (Signed)
 VIALS MADE 09-03-24

## 2024-09-03 NOTE — Progress Notes (Signed)
 Aeroallergen Immunotherapy  Ordering Provider: Dr. Danita Brain  Patient Details Name: Lalena Salas MRN: [id number] Date of Birth: January 22, 1986  Order 1 of 1  Vial Label: pollen, pet  0.3 ml (Volume)  BAU Concentration -- 7 Grass Mix* 100,000 ([place]  Blue, [place], Penrose, Perennial Rye, RedTop, Sweet Vernal, Timothy) 0.3 ml (Volume)  BAU Concentration -- [place] 10,000 0.3 ml (Volume)  1:20 Concentration -- Ragweed Mix 0.2 ml (Volume)  1:20 Concentration -- Cocklebur 0.2 ml (Volume)  1:10 Concentration -- Plantain English 0.2 ml (Volume)  1:20 Concentration -- [place] Thistle 0.2 ml (Volume)  1:40 Concentration -- Baccharis 0.5 ml (Volume)  1:20 Concentration -- Weed Mix* 0.5 ml (Volume)  1:20 Concentration -- Eastern 10 Tree Mix (also Sweet Gum) 0.2 ml (Volume)  1:10 Concentration -- Cedar, red 0.2 ml (Volume)  1:10 Concentration -- Pecan Pollen 0.2 ml (Volume)  1:10 Concentration -- Pine Mix 0.2 ml (Volume)  1:20 Concentration -- Red Mulberry 0.2 ml (Volume)  1:20 Concentration -- Walnut, Black Pollen 0.5 ml (Volume)  1:10 Concentration -- Cat Hair 0.5 ml (Volume)  1:10 Concentration -- Dog Epithelia   4.7  ml Extract Subtotal 0.3  ml Diluent  5.0  ml Maintenance Total  Schedule:  A Silver Vial (1:1,000,000): Schedule A (10 doses) Blue Vial (1:100,000): Schedule A (10 doses) Yellow Vial (1:10,000): Schedule A (10 doses) Green Vial (1:1,000): Schedule A (10 doses) Red Vial (1:100): Schedule A (10 doses)  Special Instructions: please start pt in silver vial schedule A.   After completion of the first Red Vial, please space to every two weeks. After completion of the second Red Vial, please space to every 4 weeks. Ok to up dose new vials at 0.69mL --> 0.3 mL --> 0.5 mL.  Ok to come twice weekly, if desired, as long as there is 48 hours between injections. Pt will continue to receive shots at outside allergist in [place].

## 2024-09-07 ENCOUNTER — Ambulatory Visit (INDEPENDENT_AMBULATORY_CARE_PROVIDER_SITE_OTHER)

## 2024-09-07 DIAGNOSIS — J3089 Other allergic rhinitis: Secondary | ICD-10-CM | POA: Diagnosis not present

## 2024-09-07 NOTE — Telephone Encounter (Signed)
 err

## 2024-09-14 ENCOUNTER — Encounter: Payer: Self-pay | Admitting: Allergy

## 2024-09-21 ENCOUNTER — Ambulatory Visit

## 2024-09-22 ENCOUNTER — Ambulatory Visit

## 2024-10-02 NOTE — Progress Notes (Signed)
 Reason for Visit/Referral: [facility]   Physician:Dr. Dorothey  Type of Visit:  In office visit, 60 minutes   Patient Comments Between 2020-2022 when she started going through all of these health challenges, she lost close to 50#. 2024: lost down to 143# She continues to lose weight slowly.  Since summer she has implemented a low histamine diet and has seen 50% symptom relief.  She does note around her cycle as levels of progesterone decrease/estrogen levels increase, this causes more release of histamine and more dizziness.  She has good knowledge of low histamine and liberator foods.  She uses Neocate Splash Vanilla supplement 1 a day mostly sometime 1 1/2 to 2 bottles, if she skips dinner.  She is not currently using DAO enzymes or other supplements to breakdown histamine.  She is sensitive to certain fruits/veggies (apple,zucchini) when not cooked at higher temperatures: OAS(pollen fruit syndrome). She will develop itchy mouth/throat symptoms.  She is considering retrying certain vegetables/fruits but cooking them at higher temperatures before ruling them out.  She is open to trying and adding in new foods. Some she is looking forward to are: quail eggs, tuna, sea bass, high temperature cooked apple,zucchini.  She wants to add in chicken again but prior to this chicken was increasing histamine levels.  Her focus today is on developing a meal plan to meet her needs and active lifestyle and prevent further weight loss.   Nutrition Related Medical History Pollen Food Allergy : Valrie Elliot Family: potatoes,apples,tree nuts Mustard seed,corn products/corn,peanuts,soy,wheat,egg extract Vitamin D/Iron deficiency Mast Cell Actviation  Weight Management  10-02-24: 132.6# ( in office today ) 09-11-24: 132 lb 12.8 oz (in office measurement)  InBody Data (not available) SMM   - %BF   - Visceral Fat  - BMR   -  Notable Lab Results (Date) B12   - WNL Glucose - 131 ( 07/05/24) Vit D   - 17 (  07-12-24)  She had a comprehensive lab draw today.   Supplements: D2 , Thorne MVI 1 / d Neocate drink: 237 calories, 7 g pro, 24.9 carbs, 12.1g fat  Physical Activity:   Walking or pilates on her rest days which are Mondays/Wednesday RT with her trainor Tuesday/Thursday for 45 minutes: full body  Nutrition Assessment   B: Hot tea: ginger or lemon/ginger, Nadara Ferrari sometimes green tea ,little black tea 7:30 am: Before workout:  She prepares a pita bread from buckwheat flour. This her bread in her diet now. Puts some butter on this. 9:30 am: After workout  Handful of blueberries Neocate  She makes a turkey soup: turkey leg or cutlet meat, carrots,onion,celery,garlic,onion. She puts this into a cube container and freezes. She will pop out 1 cube for her to have at breakfast or at other meals.  She does not snack much but does have a snack around 3pm: 1 frozen fruit popsicle made from real fruit (watermelon) with 1 slice buckwheat bread  L:  Black eye peas (she does like Chickpeas and black beans) however has not implemented these yet. She noted she wanted to be tested for Chickpea allergy . Yellow yam : 1/4 cup with butter,salt,lactose free sour cream  Dinner is by 7pm: She had professional help until recently to cook for her /family.  This is something she is planning to re-instate.  She may not have this meal but will drink another Neocate If she is cooking: Turkey Cutlet ( flash fry/bake), frozen kale (will add butter) or if a salad she would need to make a  fresh dressing. Was having a cabbage steak but this started to increase histamine but then again she is unsure if she cooked it at high enough temperature and if that was why she was reacting to it negatively.  Her carbohydrates are: buckwheat bread, yellow yam, grapes, blueberries She tolerates: butter, olive oil and she has been trying coconut milk, lactose free milk, cod, steak,chuck roast,flounder  She cooks and freezes  her foods quickly to have as leftovers. She goes to a Midwife to ask for fresh slaughtered/frozen meats etc.  Fluids: Very little alcohol.   Recommendations:   Daily calorie goal: 1700-2000 goal Daily Protein goal: 95-100 grams Discussed options for breakfast and other meals today to increase calories, variety and nutrient density. Recipes provided today for breakfast (buckwheat pancakes/waffles) and for lunch/dinner, a stir fry and a bowl dish using buckwheat groats. Encouraged 100% buckwheat noodles for her to use in soup, stir-fry and bowl recipes as well. Discussed Sprout Living Pumpkin Seed Protein powder. She is going to inquire on the companies processing practices to avoid cross-contamination. Otherwise, this product is low histamine,free of known allergens she has and currently she has no allergens to pumpkin seeds. 1 serving =20 grams protein. Discussed, if she can use the protein powder, she can include this into Neocate to boost protein content or make smoothies with her Lactose Free milk, her  safe fruits/frozen kale for afternoon snack or breakfast option. Discussed making fresh hummus with black eye peas for snack with buckwheat pita in the afternoon. Discussed making her own turkey sausage by grinding her turkey fresh and adding spices for the flavor and then freeze to pull out as needed. Provided list of companies that she can access meat, chicken,fish from that freeze after slaughter and test for histamine levels. This may help her to be able to add chicken back and try new types of seafood with more confidence. Considering purchasing a meat grinder for her to grind her meat as needed for dishes she has enjoyed but unable to have due to high histamine levels when purchased at the store. Encouraged her to decide a day she is comfortable with to introduce a food she has been considering. To progress gradually one food at a time over the next few months, to test her individual  tolerance/triggers.  Eventually, the goal is to be able to create an individualized food list to liberalize her diet and increase confidence. Follow up encouraged.

## 2024-10-15 ENCOUNTER — Encounter: Payer: Self-pay | Admitting: Allergy

## 2024-10-15 ENCOUNTER — Ambulatory Visit: Admitting: Allergy

## 2024-10-15 ENCOUNTER — Other Ambulatory Visit: Payer: Self-pay

## 2024-10-15 VITALS — BP 108/82 | HR 90 | Temp 98.1°F

## 2024-10-15 DIAGNOSIS — K219 Gastro-esophageal reflux disease without esophagitis: Secondary | ICD-10-CM

## 2024-10-15 DIAGNOSIS — J4520 Mild intermittent asthma, uncomplicated: Secondary | ICD-10-CM | POA: Diagnosis not present

## 2024-10-15 DIAGNOSIS — T7819XD Other adverse food reactions, not elsewhere classified, subsequent encounter: Secondary | ICD-10-CM | POA: Diagnosis not present

## 2024-10-15 DIAGNOSIS — J3089 Other allergic rhinitis: Secondary | ICD-10-CM

## 2024-10-15 DIAGNOSIS — Z888 Allergy status to other drugs, medicaments and biological substances status: Secondary | ICD-10-CM

## 2024-10-15 DIAGNOSIS — T50905D Adverse effect of unspecified drugs, medicaments and biological substances, subsequent encounter: Secondary | ICD-10-CM

## 2024-10-15 MED ORDER — MOMETASONE FURO-FORMOTEROL FUM 200-5 MCG/ACT IN AERO
INHALATION_SPRAY | RESPIRATORY_TRACT | 5 refills | Status: AC
Start: 2024-10-15 — End: ?

## 2024-10-15 MED ORDER — ALBUTEROL SULFATE HFA 108 (90 BASE) MCG/ACT IN AERS: 2.0000 | INHALATION_SPRAY | Freq: Four times a day (QID) | RESPIRATORY_TRACT | 1 refills | Status: AC | PRN

## 2024-10-15 MED ORDER — CROMOLYN SODIUM 100 MG/5ML PO CONC: 200.0000 mg | Freq: Three times a day (TID) | ORAL | 3 refills | Status: AC

## 2024-10-15 MED ORDER — FLUTICASONE PROPIONATE 50 MCG/ACT NA SUSP: 1.0000 | NASAL | 5 refills | Status: AC | PRN

## 2024-10-15 NOTE — Patient Instructions (Addendum)
 Allergic reaction/Food allergy   Allergic rhinitis with OAS GERD Reactive airway, allergen driven Adverse effect of medication  - Continue avoidance measures for grasses, ragweed, weeds, trees, indoor molds, outdoor molds, dust mites, cat, dog, and cockroach - Food allergy  testing from 08/2024 showed: peanut , soybean, cashew, hazelnut, oat, corn, mustard   -Continue allergy  immunotherapy per protocol.  She was provided with her allergen vials to take with her to her local [facility] allergist to continue administration - Continue Allegra daily, with the option to increase to two tablets daily during exacerbations. 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 Previous mastocytosis workup normal.  Labs drawn today for updated testing.  Symptoms managed with cromolyn  and antihistamines. - Continue cromolyn  3-4 times a day prior to meals - Continue Pepcid  40 mg for H2 blockade - Continue current food avoidance.  Slowly introducing foods as tolerated back into the diet - Have access to epinephrine  device EpiPen  or Neffy , nasal spray device. - Hydroxyzine  as needed for exacerbations - Tryptase level available to obtain during flare symptoms  Significant adverse reactions to Xolair , including numbness, led to discontinuation. - Will not use Xolair  in the future  Adverse reaction to Singulair  - Avoid Singulair  in future treatment plans.    GERD symptoms managed with esomeprazole. Concerns about long-term PPI use. - Pepcid  as above   Asthma symptoms largely controlled with maintenance inhaler - Continue mometasone inhaler 1-2 times a day.  - Have access to albuterol  for relief of symptoms.  2 puffs every 4 hours as needed  Follow up in 3 months

## 2024-10-15 NOTE — Progress Notes (Unsigned)
 Follow-up Note  RE: [name] [name] MRN: [id number] DOB: 12-19-1985 Date of Office Visit: 10/15/2024   History of present illness: [name] [name] is a 38 y.o. female presenting today for follow-up of allergic reaction with food allergy , allergic rhinitis with OAS, GERD and reactive airway.  She presents today with her mother. Discussed the use of AI scribe software for clinical note transcription with the patient, who gave verbal consent to proceed.  She is currently undergoing allergy  shots and had previously reached maintenance on shots. She has been doing well with the shots and is currently through the first concentration of the vials.  She receives her injections at her local allergist in [place].    She has a history of mast cell activation symptoms and is considering the impact of diet on her symptoms. She has been experimenting with reintroducing foods like buckwheat flour and brown rice. After consuming brown rice, she feels uneasy and 'like I'm about to not have control', which she manages with taking extra cromolyn  and Allegra. She is cautious about introducing new foods, especially during ovulation and menstruation, when her symptoms tend to worsen.  Her current medication regimen includes cromolyn  sodium, which she takes four-five times a day, and Allegra, which she takes 1-2 times daily. She also uses famotidine  as needed. She has noticed a reduction in nighttime histamine dumps since changing her diet to low histamine foods.  She is exploring the possibility of trying quail eggs due to adverse reactions to chicken eggs. She has had positive experiences with a new dietitian who has provided helpful dietary guidance.  Review of systems: 10pt ROS negative unless noted above in HPI   Past medical/social/surgical/family history have been reviewed and are unchanged unless specifically indicated below.  No changes  Medication List: Current Outpatient Medications  Medication Sig  Dispense Refill   albuterol  (PROAIR  HFA) 108 (90 Base) MCG/ACT inhaler Inhale 2 puffs into the lungs every 6 (six) hours as needed for wheezing or shortness of breath. With spacer 18 g 1   azelastine (ASTELIN) 0.1 % nasal spray Place 2 sprays into the nose.     clobetasol ointment (TEMOVATE) 0.05 % Apply topically.     cromolyn  (GASTROCROM ) 100 MG/5ML solution Take 10 mLs (200 mg total) by mouth 4 (four) times daily -  before meals and at bedtime. 1200 mL 3   cycloSPORINE (RESTASIS) 0.05 % ophthalmic emulsion Place 1 drop into both eyes as needed.     EPINEPHrine  0.3 mg/0.3 mL IJ SOAJ injection Inject 0.3 mg into the muscle as needed for anaphylaxis. 2 each 1   famotidine  (PEPCID ) 40 MG tablet Take 1 tablet (40 mg total) by mouth 2 (two) times daily. 60 tablet 5   fexofenadine (ALLEGRA) 180 MG tablet Take 180 mg by mouth daily.     fluocinolone (SYNALAR) 0.01 % external solution Apply topically daily.     Fluocinolone Acetonide Scalp 0.01 % OIL Apply 1 application topically daily as needed (itching).      fluticasone  (FLONASE ) 50 MCG/ACT nasal spray Place 1 spray into both nostrils as needed for allergies or rhinitis. 16 g 5   fluticasone  (FLOVENT  HFA) 110 MCG/ACT inhaler Inhale 2 puffs into the lungs 2 (two) times daily as needed. (Patient taking differently: Inhale 2 puffs into the lungs 2 (two) times daily as needed (wheezing).) 1 Inhaler 5   hydrOXYzine  (ATARAX /VISTARIL ) 10 MG tablet Take 1-2 tablets three times a day as needed. (Patient taking differently: Take 5 mg by mouth 3 (  three) times daily as needed for itching or anxiety.) 90 tablet 1   loratadine  (CLARITIN ) 10 MG tablet Take 1 tablet (10 mg total) by mouth daily as needed. 90 tablet 1   meloxicam (MOBIC) 15 MG tablet Take 15 mg by mouth as needed.     nitrofurantoin , macrocrystal-monohydrate, (MACROBID ) 100 MG capsule Take 100 mg by mouth See admin instructions. 1 tablet after intercourse as needed     omeprazole  (PRILOSEC) 40 MG  capsule Take 40 mg by mouth daily.     ondansetron  (ZOFRAN -ODT) 4 MG disintegrating tablet Take 4 mg by mouth as needed.     predniSONE  (DELTASONE ) 10 MG tablet Take 10 mg by mouth as needed.     Rimegepant Sulfate (NURTEC) 75 MG TBDP Take by mouth.     Selenium Sulfide 2.3 % SHAM Apply 1 application topically daily as needed. For dandruff     Spacer/Aero-Holding Raguel FRENCH Use as directed with albuterol  hfa inhaler. 1 each 0   No current facility-administered medications for this visit.     Known medication allergies: Allergies  Allergen Reactions   Diphenhydramine  Other (See Comments)    Chest Pain Chest Pain Chest Pain   Other     Tree nuts    Peanut -Containing Drug Products Anaphylaxis   Diphenhydramine -Zinc Acetate Other (See Comments)   Lidocaine Other (See Comments)   Montelukast  Sodium Other (See Comments)   Corn-Containing Products    Mustard    Soy Allergy  (Obsolete)    Zyrtec  [Cetirizine ]     palpatations    Ketoconazole Rash   Tobramycin Other (See Comments)    Caused dizziness  Other reaction(s): Other (See Comments)  Caused dizziness  Caused dizziness  Other reaction(s): Other (See Comments), Other (See Comments), Other (See Comments)    Other reaction(s): Other (See Comments)    Caused dizziness    Other Reaction(s): Other    Other reaction(s): Other (See Comments) Caused dizziness Caused dizziness  Caused dizziness  Other reaction(s): Other (See Comments), Other (See Comments), Other (See Comments) Other reaction(s): Other (See Comments) Caused dizziness Caused dizziness Caused dizziness Caused dizziness  Other reaction(s): Other (See Comments) Caused dizziness Caused dizziness    Caused dizziness    Other reaction(s): Other (See Comments), Other (See Comments), Other (See Comments) Other reaction(s): Other (See Comments) Caused dizziness Caused dizziness Caused dizziness Caused dizziness     Physical examination: Blood pressure 108/82, pulse  90, temperature 98.1 F (36.7 C), temperature source Temporal, SpO2 100%.  General: Alert, interactive, in no acute distress. HEENT: PERRLA, TMs pearly gray, turbinates markedly edematous without discharge, post-pharynx non erythematous. Neck: Supple without lymphadenopathy. Lungs: Clear to auscultation without wheezing, rhonchi or rales. {no increased work of breathing. CV: Normal S1, S2 without murmurs. Abdomen: Nondistended, nontender. Skin: Warm and dry, without lesions or rashes. Extremities:  No clubbing, cyanosis or edema. Neuro:   Grossly intact.  Diagnostics/Labs: Immunotherapy injections given today  Assessment and plan: Patient Instructions  Allergic reaction/Food allergy   Allergic rhinitis with OAS GERD Reactive airway, allergen driven Adverse effect of medication  - Continue avoidance measures for grasses, ragweed, weeds, trees, indoor molds, outdoor molds, dust mites, cat, dog, and cockroach - Food allergy  testing from 08/2024 showed: peanut , soybean, cashew, hazelnut, oat, corn, mustard  -Continue allergy  immunotherapy per protocol.  She was provided with her allergen vials to take with her to her local [facility] allergist to continue administration - Continue Allegra daily, with the option to increase to two tablets daily during exacerbations. -  Continue Flonase  nasal sprays 1 to 2 sprays each nostril up to 2 weeks at a time for congestion control  Suspected mast cell activation syndrome contributing to food intolerance. Previous mastocytosis workup normal.  Labs drawn today for updated testing.  Symptoms managed with cromolyn  and antihistamines. - Continue cromolyn  3-4 times a day prior to meals - Continue Pepcid  40 mg for H2 blockade - Continue current food avoidance.  Slowly introducing foods as tolerated back into the diet - Have access to epinephrine  device EpiPen  or Neffy , nasal spray device. - Hydroxyzine  as needed for exacerbations - Tryptase level  available to obtain during flare symptoms  Significant adverse reactions to Xolair , including numbness, led to discontinuation. - Will not use Xolair  in the future  Adverse reaction to Singulair  - Avoid Singulair  in future treatment plans.    GERD symptoms managed with esomeprazole. Concerns about long-term PPI use. - Pepcid  as above   Asthma symptoms largely controlled with maintenance inhaler - Continue mometasone inhaler 1-2 times a day.  - Have access to albuterol  for relief of symptoms.  2 puffs every 4 hours as needed  Follow-up in 3 months   I personally spent a total of 49 minutes in the care of the patient today including preparing to see the patient, getting/reviewing separately obtained history, performing a medically appropriate exam/evaluation, counseling and educating, and coordinating care  I appreciate the opportunity to take part in Darlean's care. Please do not hesitate to contact me with questions.  Sincerely,   Danita Brain, MD Allergy /Immunology [facility]

## 2024-11-24 ENCOUNTER — Encounter: Payer: Self-pay | Admitting: Allergy

## 2025-01-15 ENCOUNTER — Ambulatory Visit: Admitting: Allergy

## 2025-04-08 ENCOUNTER — Ambulatory Visit: Admitting: Allergy
